# Patient Record
Sex: Female | Born: 1955
Health system: Southern US, Community
[De-identification: ages and names within clinical notes are randomized; demographics above are authoritative.]

## PROBLEM LIST (undated history)

## (undated) DIAGNOSIS — R112 Nausea with vomiting, unspecified: Secondary | ICD-10-CM

## (undated) DIAGNOSIS — Z9889 Other specified postprocedural states: Secondary | ICD-10-CM

## (undated) DIAGNOSIS — Z8489 Family history of other specified conditions: Secondary | ICD-10-CM

## (undated) DIAGNOSIS — I209 Angina pectoris, unspecified: Secondary | ICD-10-CM

## (undated) DIAGNOSIS — D649 Anemia, unspecified: Secondary | ICD-10-CM

## (undated) DIAGNOSIS — N183 Chronic kidney disease, stage 3 unspecified: Secondary | ICD-10-CM

## (undated) DIAGNOSIS — M1990 Unspecified osteoarthritis, unspecified site: Secondary | ICD-10-CM

## (undated) DIAGNOSIS — I10 Essential (primary) hypertension: Secondary | ICD-10-CM

## (undated) DIAGNOSIS — T8859XA Other complications of anesthesia, initial encounter: Secondary | ICD-10-CM

## (undated) DIAGNOSIS — T4145XA Adverse effect of unspecified anesthetic, initial encounter: Secondary | ICD-10-CM

## (undated) DIAGNOSIS — I441 Atrioventricular block, second degree: Secondary | ICD-10-CM

## (undated) DIAGNOSIS — E039 Hypothyroidism, unspecified: Secondary | ICD-10-CM

## (undated) DIAGNOSIS — E119 Type 2 diabetes mellitus without complications: Secondary | ICD-10-CM

## (undated) DIAGNOSIS — Z950 Presence of cardiac pacemaker: Secondary | ICD-10-CM

## (undated) HISTORY — PX: CARDIAC CATHETERIZATION: SHX172

---
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
## 1999-01-02 HISTORY — PX: TOTAL KNEE ARTHROPLASTY: SHX125

## 2010-01-01 HISTORY — PX: SHOULDER SURGERY: SHX246

## 2010-10-30 ENCOUNTER — Inpatient Hospital Stay (HOSPITAL_COMMUNITY): Payer: Medicare Other

## 2010-10-30 ENCOUNTER — Inpatient Hospital Stay (HOSPITAL_COMMUNITY)
Admission: RE | Admit: 2010-10-30 | Discharge: 2010-11-03 | DRG: 493 | Disposition: A | Discharge status: Skilled Nursing Facility | Payer: Medicare Other | Source: Ambulatory Visit | Attending: Orthopedic Surgery | Admitting: Orthopedic Surgery

## 2010-10-30 DIAGNOSIS — W19XXXA Unspecified fall, initial encounter: Secondary | ICD-10-CM | POA: Diagnosis present

## 2010-10-30 DIAGNOSIS — E119 Type 2 diabetes mellitus without complications: Secondary | ICD-10-CM | POA: Diagnosis present

## 2010-10-30 DIAGNOSIS — Z8679 Personal history of other diseases of the circulatory system: Secondary | ICD-10-CM

## 2010-10-30 DIAGNOSIS — E876 Hypokalemia: Secondary | ICD-10-CM | POA: Diagnosis not present

## 2010-10-30 DIAGNOSIS — Z6841 Body Mass Index (BMI) 40.0 and over, adult: Secondary | ICD-10-CM

## 2010-10-30 DIAGNOSIS — Z794 Long term (current) use of insulin: Secondary | ICD-10-CM

## 2010-10-30 DIAGNOSIS — Z96659 Presence of unspecified artificial knee joint: Secondary | ICD-10-CM

## 2010-10-30 DIAGNOSIS — D62 Acute posthemorrhagic anemia: Secondary | ICD-10-CM | POA: Diagnosis not present

## 2010-10-30 DIAGNOSIS — S42209A Unspecified fracture of upper end of unspecified humerus, initial encounter for closed fracture: Principal | ICD-10-CM | POA: Diagnosis present

## 2010-10-30 DIAGNOSIS — E039 Hypothyroidism, unspecified: Secondary | ICD-10-CM | POA: Diagnosis present

## 2010-10-30 DIAGNOSIS — G4733 Obstructive sleep apnea (adult) (pediatric): Secondary | ICD-10-CM | POA: Diagnosis present

## 2010-10-30 DIAGNOSIS — I2510 Atherosclerotic heart disease of native coronary artery without angina pectoris: Secondary | ICD-10-CM | POA: Diagnosis present

## 2010-10-30 DIAGNOSIS — Z79899 Other long term (current) drug therapy: Secondary | ICD-10-CM

## 2010-10-30 DIAGNOSIS — I10 Essential (primary) hypertension: Secondary | ICD-10-CM | POA: Diagnosis present

## 2010-10-30 LAB — URINALYSIS, MICROSCOPIC ONLY
Bilirubin Urine: NEGATIVE
Glucose, UA: NEGATIVE mg/dL
Ketones, ur: NEGATIVE mg/dL
Nitrite: NEGATIVE
Protein, ur: NEGATIVE mg/dL

## 2010-10-30 LAB — DIFFERENTIAL
Basophils Relative: 1 % (ref 0–1)
Lymphocytes Relative: 23 % (ref 12–46)
Lymphs Abs: 2 10*3/uL (ref 0.7–4.0)
Monocytes Absolute: 0.6 10*3/uL (ref 0.1–1.0)
Monocytes Relative: 7 % (ref 3–12)
Neutro Abs: 5.7 10*3/uL (ref 1.7–7.7)

## 2010-10-30 LAB — COMPREHENSIVE METABOLIC PANEL
ALT: 17 U/L (ref 0–35)
Alkaline Phosphatase: 112 U/L (ref 39–117)
BUN: 15 mg/dL (ref 6–23)
Chloride: 96 mEq/L (ref 96–112)
GFR calc Af Amer: 84 mL/min — ABNORMAL LOW (ref >90)
Glucose, Bld: 112 mg/dL — ABNORMAL HIGH (ref 70–99)
Potassium: 4.5 mEq/L (ref 3.5–5.1)
Total Bilirubin: 0.3 mg/dL (ref 0.3–1.2)

## 2010-10-30 LAB — CBC
HCT: 32.7 % — ABNORMAL LOW (ref 36.0–46.0)
Hemoglobin: 10.7 g/dL — ABNORMAL LOW (ref 12.0–15.0)
MCH: 26.6 pg (ref 26.0–34.0)
MCHC: 32.7 g/dL (ref 30.0–36.0)
MCV: 81.3 fL (ref 78.0–100.0)

## 2010-10-30 LAB — MRSA PCR SCREENING: MRSA by PCR: NEGATIVE

## 2010-10-30 LAB — TYPE AND SCREEN: ABO/RH(D): A NEG

## 2010-10-30 LAB — ABO/RH: ABO/RH(D): A NEG

## 2010-10-31 ENCOUNTER — Inpatient Hospital Stay (HOSPITAL_COMMUNITY): Payer: Medicare Other

## 2010-10-31 LAB — URINE CULTURE
Colony Count: NO GROWTH
Culture  Setup Time: 201210300108

## 2010-10-31 LAB — GLUCOSE, CAPILLARY: Glucose-Capillary: 101 mg/dL — ABNORMAL HIGH (ref 70–99)

## 2010-11-01 LAB — BASIC METABOLIC PANEL
BUN: 10 mg/dL (ref 6–23)
CO2: 25 mEq/L (ref 19–32)
Calcium: 9 mg/dL (ref 8.4–10.5)
Chloride: 96 mEq/L (ref 96–112)
Creatinine, Ser: 0.64 mg/dL (ref 0.50–1.10)
GFR calc Af Amer: >90 mL/min (ref >90)
GFR calc non Af Amer: >90 mL/min (ref >90)
Glucose, Bld: 128 mg/dL — ABNORMAL HIGH (ref 70–99)
Glucose, Bld: 161 mg/dL — ABNORMAL HIGH (ref 70–99)
Potassium: 5.4 mEq/L — ABNORMAL HIGH (ref 3.5–5.1)

## 2010-11-01 LAB — GLUCOSE, CAPILLARY
Glucose-Capillary: 134 mg/dL — ABNORMAL HIGH (ref 70–99)
Glucose-Capillary: 177 mg/dL — ABNORMAL HIGH (ref 70–99)
Glucose-Capillary: 151 mg/dL — ABNORMAL HIGH (ref 70–99)

## 2010-11-01 LAB — CBC
HCT: 32.3 % — ABNORMAL LOW (ref 36.0–46.0)
Hemoglobin: 10.3 g/dL — ABNORMAL LOW (ref 12.0–15.0)
MCH: 26.4 pg (ref 26.0–34.0)
MCHC: 31.9 g/dL (ref 30.0–36.0)
MCV: 82.8 fL (ref 78.0–100.0)
RDW: 16.5 % — ABNORMAL HIGH (ref 11.5–15.5)

## 2010-11-02 LAB — GLUCOSE, CAPILLARY
Glucose-Capillary: 136 mg/dL — ABNORMAL HIGH (ref 70–99)
Glucose-Capillary: 196 mg/dL — ABNORMAL HIGH (ref 70–99)
Glucose-Capillary: 183 mg/dL — ABNORMAL HIGH (ref 70–99)
Glucose-Capillary: 136 mg/dL — ABNORMAL HIGH (ref 70–99)
Glucose-Capillary: 164 mg/dL — ABNORMAL HIGH (ref 70–99)

## 2010-11-02 LAB — CBC
Hemoglobin: 8.7 g/dL — ABNORMAL LOW (ref 12.0–15.0)
Platelets: 276 10*3/uL (ref 150–400)
RBC: 3.34 MIL/uL — ABNORMAL LOW (ref 3.87–5.11)
WBC: 9.9 10*3/uL (ref 4.0–10.5)

## 2010-11-02 LAB — BASIC METABOLIC PANEL
CO2: 24 mEq/L (ref 19–32)
Glucose, Bld: 125 mg/dL — ABNORMAL HIGH (ref 70–99)
Potassium: 3.5 mEq/L (ref 3.5–5.1)
Sodium: 134 mEq/L — ABNORMAL LOW (ref 135–145)

## 2010-11-03 LAB — BASIC METABOLIC PANEL
BUN: 8 mg/dL (ref 6–23)
CO2: 24 mEq/L (ref 19–32)
Calcium: 8.7 mg/dL (ref 8.4–10.5)
Chloride: 98 mEq/L (ref 96–112)
Creatinine, Ser: 0.55 mg/dL (ref 0.50–1.10)

## 2010-11-03 LAB — TSH: TSH: 10.571 u[IU]/mL — ABNORMAL HIGH (ref 0.350–4.500)

## 2010-11-03 LAB — PHOSPHORUS: Phosphorus: 3.4 mg/dL (ref 2.3–4.6)

## 2010-11-03 LAB — CBC
HCT: 28.5 % — ABNORMAL LOW (ref 36.0–46.0)
MCH: 26.1 pg (ref 26.0–34.0)
MCV: 81.9 fL (ref 78.0–100.0)
Platelets: 277 10*3/uL (ref 150–400)
RBC: 3.48 MIL/uL — ABNORMAL LOW (ref 3.87–5.11)
RDW: 16.1 % — ABNORMAL HIGH (ref 11.5–15.5)
WBC: 8.1 10*3/uL (ref 4.0–10.5)

## 2010-11-03 LAB — PREALBUMIN: Prealbumin: 7.6 mg/dL — ABNORMAL LOW (ref 17.0–34.0)

## 2010-11-03 LAB — MAGNESIUM: Magnesium: 1.9 mg/dL (ref 1.5–2.5)

## 2010-11-03 LAB — GLUCOSE, CAPILLARY: Glucose-Capillary: 199 mg/dL — ABNORMAL HIGH (ref 70–99)

## 2010-11-06 LAB — VITAMIN B12 BINDING CAPACITY, BLOOD: Vitamin B12 Bind Capacity: 1543 pg/mL — ABNORMAL HIGH (ref 650–1340)

## 2010-11-07 NOTE — Op Note (Signed)
Vickie Ferguson, Ferguson NO.:  [id number]  MEDICAL RECORD NO.:  [id number]  LOCATION:  2016                         FACILITY:  [place]  PHYSICIAN:  Astrid Divine. Marcelino Scot, M.D. DATE OF BIRTH:  [birth year]  DATE OF PROCEDURE:  10/31/2010 DATE OF DISCHARGE:                              OPERATIVE REPORT   PREOPERATIVE DIAGNOSIS:  Right three-part proximal humerus fracture.  POSTOPERATIVE DIAGNOSIS:  Right three-part proximal humerus fracture.  PROCEDURE:  Open reduction and internal fixation of right proximal humerus.  SURGEON:  Astrid Divine. Marcelino Scot, MD  ASSISTANT:  Jari Pigg, PA  ANESTHESIA:  General.  COMPLICATIONS:  None.  ESTIMATED BLOOD LOSS:  100 mL.  DISPOSITION:  PACU.  CONDITION:  Stable.  BRIEF SUMMARY AND INDICATION FOR PROCEDURE:  Vickie Ferguson is a 55 year old, right-hand dominant, morbidly obese female who sustained a right proximal humerus fracture several weeks ago.  She was initially seen evaluated at the [facility] where because of her cardiac risk factors and the complexity of the fracture, they felt that she should be transferred for definitive treatment to [facility].  She was seen initially as an outpatient, admitted yesterday with consultation by Anesthesiology, and prior outpatient workup by Cardiology given V-tach runs during a prior cataract procedure.  I did discuss with her preoperative risks and benefits of the surgical repair of right proximal humerus including the significantly increased difficulty and anticipated increase in operative time because of her obesity which exceeds 400 pounds.  I also discussed the extreme importance of compliance postoperatively.  She is walker dependent and that noncompliance would result in a disastrous outcome with loss of fixation, nonunion, possible infection, and others.  We also discussed other systemic risks including arrhythmia, heart attack, stroke, death, nerve injury, vessel  injury, and after all these discussions, the patient did wish to proceed.  BRIEF DESCRIPTION OF PROCEDURE:  Vickie Ferguson was administered preop antibiotics and taken to the operating room where general anesthesia was induced.  Her right upper extremity was prepped and draped in the usual sterile fashion.  A standard deltopectoral approach was made.  The exposure and dissection was extended and more difficult secondary to the large soft tissue envelope.  I was able to identify the cephalic vein, retracted laterally with the deltoid, exposed the clavipectoral fascia which was incised, placed retractor along the short head of the biceps tendon, and then identified the long head biceps tendon.  A #2 FiberWire was placed into the posterior and superior rotator cuff, and an additional #2  FiberWire into the subscap and anterior cuff.  The humeral head was then controlled in this manner.  Because of the delay from the time of injury to surgery, it was difficult to mobilize these fracture segments, particularly in shaft and derotate them into a reduced position.  Ultimately, we were able to achieve this and secure a provisional fixation with K-wires checking multiple C-arm images and then converting it to a humeral plate.  Following placement of the plate and standard screws, locked screws were used and then the subscap and the lesser tuberosity fragment tied with #2 FiberWire and the greater tuberosity and supra and infraspinatus  components tied with #2 FiberWire to the plate as well.  The shell fragment of the posterior infraspinatus insertion was deep to the infraspinatus repair.  The proximal humerus shaft then moved as a unit.  I did also use a tenaculum with points to assist with reduction of the shaft to the humeral head.  My assistant, Ainsley Spinner, PA-C helped throughout the procedure and was absolutely necessary for its completion.  He pulled traction and derotated for reduction, and also  assisted me with placement and removal of provisional definitive fixation as well as repair of the tuberosities themselves.  He also assisted me with wound closure.  After final x- rays, wound was irrigated thoroughly and a standard layered closure performed with 0-Vicryl, 2-0 Vicryl, and staples for the skin.  Sterile gently compressive dressing was applied.  The patient was awakened from anesthesia and transferred to PACU in stable condition.  PROGNOSIS:  Vickie Ferguson has had a very difficult fracture repair and postoperatively is at risk for early weightbearing and infection because of her morbid obesity and other comorbid conditions.  She can begin passive range of motion, and this will be converted to active assisted motion at 6-8 weeks with active motion, most likely at the 8-10 week mark.  She will remain under surveillance on the monitored floor.     Astrid Divine. Marcelino Scot, M.D.     MHH/MEDQ  D:  10/31/2010  T:  10/31/2010  Job:  [id number]  Electronically Signed by Altamese Los Panes M.D. on 11/07/2010 01:50:27 PM

## 2010-11-08 LAB — PTH-RELATED PEPTIDE: PTH-related peptide: 13 pg/mL — ABNORMAL LOW (ref 14–27)

## 2010-12-30 DIAGNOSIS — S42309D Unspecified fracture of shaft of humerus, unspecified arm, subsequent encounter for fracture with routine healing: Secondary | ICD-10-CM | POA: Diagnosis not present

## 2010-12-30 DIAGNOSIS — Z9181 History of falling: Secondary | ICD-10-CM | POA: Diagnosis not present

## 2010-12-30 DIAGNOSIS — F329 Major depressive disorder, single episode, unspecified: Secondary | ICD-10-CM | POA: Diagnosis not present

## 2010-12-30 DIAGNOSIS — Z87440 Personal history of urinary (tract) infections: Secondary | ICD-10-CM | POA: Diagnosis not present

## 2010-12-30 DIAGNOSIS — E119 Type 2 diabetes mellitus without complications: Secondary | ICD-10-CM | POA: Diagnosis not present

## 2010-12-30 DIAGNOSIS — I10 Essential (primary) hypertension: Secondary | ICD-10-CM | POA: Diagnosis not present

## 2010-12-31 DIAGNOSIS — S42309D Unspecified fracture of shaft of humerus, unspecified arm, subsequent encounter for fracture with routine healing: Secondary | ICD-10-CM | POA: Diagnosis not present

## 2010-12-31 DIAGNOSIS — Z87440 Personal history of urinary (tract) infections: Secondary | ICD-10-CM | POA: Diagnosis not present

## 2010-12-31 DIAGNOSIS — I10 Essential (primary) hypertension: Secondary | ICD-10-CM | POA: Diagnosis not present

## 2010-12-31 DIAGNOSIS — Z9181 History of falling: Secondary | ICD-10-CM | POA: Diagnosis not present

## 2010-12-31 DIAGNOSIS — E119 Type 2 diabetes mellitus without complications: Secondary | ICD-10-CM | POA: Diagnosis not present

## 2010-12-31 DIAGNOSIS — F329 Major depressive disorder, single episode, unspecified: Secondary | ICD-10-CM | POA: Diagnosis not present

## 2011-01-01 DIAGNOSIS — Z87440 Personal history of urinary (tract) infections: Secondary | ICD-10-CM | POA: Diagnosis not present

## 2011-01-01 DIAGNOSIS — E119 Type 2 diabetes mellitus without complications: Secondary | ICD-10-CM | POA: Diagnosis not present

## 2011-01-01 DIAGNOSIS — S42309D Unspecified fracture of shaft of humerus, unspecified arm, subsequent encounter for fracture with routine healing: Secondary | ICD-10-CM | POA: Diagnosis not present

## 2011-01-01 DIAGNOSIS — F329 Major depressive disorder, single episode, unspecified: Secondary | ICD-10-CM | POA: Diagnosis not present

## 2011-01-01 DIAGNOSIS — Z9181 History of falling: Secondary | ICD-10-CM | POA: Diagnosis not present

## 2011-01-01 DIAGNOSIS — I10 Essential (primary) hypertension: Secondary | ICD-10-CM | POA: Diagnosis not present

## 2011-01-03 DIAGNOSIS — S42309D Unspecified fracture of shaft of humerus, unspecified arm, subsequent encounter for fracture with routine healing: Secondary | ICD-10-CM | POA: Diagnosis not present

## 2011-01-03 DIAGNOSIS — Z9181 History of falling: Secondary | ICD-10-CM | POA: Diagnosis not present

## 2011-01-03 DIAGNOSIS — I10 Essential (primary) hypertension: Secondary | ICD-10-CM | POA: Diagnosis not present

## 2011-01-03 DIAGNOSIS — E119 Type 2 diabetes mellitus without complications: Secondary | ICD-10-CM | POA: Diagnosis not present

## 2011-01-03 DIAGNOSIS — F329 Major depressive disorder, single episode, unspecified: Secondary | ICD-10-CM | POA: Diagnosis not present

## 2011-01-03 DIAGNOSIS — Z87440 Personal history of urinary (tract) infections: Secondary | ICD-10-CM | POA: Diagnosis not present

## 2011-01-04 DIAGNOSIS — Z9181 History of falling: Secondary | ICD-10-CM | POA: Diagnosis not present

## 2011-01-04 DIAGNOSIS — I10 Essential (primary) hypertension: Secondary | ICD-10-CM | POA: Diagnosis not present

## 2011-01-04 DIAGNOSIS — Z87440 Personal history of urinary (tract) infections: Secondary | ICD-10-CM | POA: Diagnosis not present

## 2011-01-04 DIAGNOSIS — S42309D Unspecified fracture of shaft of humerus, unspecified arm, subsequent encounter for fracture with routine healing: Secondary | ICD-10-CM | POA: Diagnosis not present

## 2011-01-04 DIAGNOSIS — F329 Major depressive disorder, single episode, unspecified: Secondary | ICD-10-CM | POA: Diagnosis not present

## 2011-01-04 DIAGNOSIS — E119 Type 2 diabetes mellitus without complications: Secondary | ICD-10-CM | POA: Diagnosis not present

## 2011-01-05 DIAGNOSIS — F329 Major depressive disorder, single episode, unspecified: Secondary | ICD-10-CM | POA: Diagnosis not present

## 2011-01-05 DIAGNOSIS — Z9181 History of falling: Secondary | ICD-10-CM | POA: Diagnosis not present

## 2011-01-05 DIAGNOSIS — I10 Essential (primary) hypertension: Secondary | ICD-10-CM | POA: Diagnosis not present

## 2011-01-05 DIAGNOSIS — E119 Type 2 diabetes mellitus without complications: Secondary | ICD-10-CM | POA: Diagnosis not present

## 2011-01-05 DIAGNOSIS — Z87440 Personal history of urinary (tract) infections: Secondary | ICD-10-CM | POA: Diagnosis not present

## 2011-01-05 DIAGNOSIS — S42309D Unspecified fracture of shaft of humerus, unspecified arm, subsequent encounter for fracture with routine healing: Secondary | ICD-10-CM | POA: Diagnosis not present

## 2011-01-08 DIAGNOSIS — Z6841 Body Mass Index (BMI) 40.0 and over, adult: Secondary | ICD-10-CM | POA: Diagnosis not present

## 2011-01-08 DIAGNOSIS — E119 Type 2 diabetes mellitus without complications: Secondary | ICD-10-CM | POA: Diagnosis not present

## 2011-01-08 DIAGNOSIS — I10 Essential (primary) hypertension: Secondary | ICD-10-CM | POA: Diagnosis not present

## 2011-01-08 DIAGNOSIS — M25519 Pain in unspecified shoulder: Secondary | ICD-10-CM | POA: Diagnosis not present

## 2011-01-09 DIAGNOSIS — F329 Major depressive disorder, single episode, unspecified: Secondary | ICD-10-CM | POA: Diagnosis not present

## 2011-01-09 DIAGNOSIS — S42309D Unspecified fracture of shaft of humerus, unspecified arm, subsequent encounter for fracture with routine healing: Secondary | ICD-10-CM | POA: Diagnosis not present

## 2011-01-09 DIAGNOSIS — I10 Essential (primary) hypertension: Secondary | ICD-10-CM | POA: Diagnosis not present

## 2011-01-09 DIAGNOSIS — E119 Type 2 diabetes mellitus without complications: Secondary | ICD-10-CM | POA: Diagnosis not present

## 2011-01-09 DIAGNOSIS — Z9181 History of falling: Secondary | ICD-10-CM | POA: Diagnosis not present

## 2011-01-09 DIAGNOSIS — Z87440 Personal history of urinary (tract) infections: Secondary | ICD-10-CM | POA: Diagnosis not present

## 2011-01-10 DIAGNOSIS — S42309D Unspecified fracture of shaft of humerus, unspecified arm, subsequent encounter for fracture with routine healing: Secondary | ICD-10-CM | POA: Diagnosis not present

## 2011-01-10 DIAGNOSIS — I10 Essential (primary) hypertension: Secondary | ICD-10-CM | POA: Diagnosis not present

## 2011-01-10 DIAGNOSIS — Z9181 History of falling: Secondary | ICD-10-CM | POA: Diagnosis not present

## 2011-01-10 DIAGNOSIS — E119 Type 2 diabetes mellitus without complications: Secondary | ICD-10-CM | POA: Diagnosis not present

## 2011-01-10 DIAGNOSIS — Z87440 Personal history of urinary (tract) infections: Secondary | ICD-10-CM | POA: Diagnosis not present

## 2011-01-10 DIAGNOSIS — F329 Major depressive disorder, single episode, unspecified: Secondary | ICD-10-CM | POA: Diagnosis not present

## 2011-01-11 DIAGNOSIS — Z87440 Personal history of urinary (tract) infections: Secondary | ICD-10-CM | POA: Diagnosis not present

## 2011-01-11 DIAGNOSIS — I10 Essential (primary) hypertension: Secondary | ICD-10-CM | POA: Diagnosis not present

## 2011-01-11 DIAGNOSIS — Z9181 History of falling: Secondary | ICD-10-CM | POA: Diagnosis not present

## 2011-01-11 DIAGNOSIS — S42309D Unspecified fracture of shaft of humerus, unspecified arm, subsequent encounter for fracture with routine healing: Secondary | ICD-10-CM | POA: Diagnosis not present

## 2011-01-11 DIAGNOSIS — F329 Major depressive disorder, single episode, unspecified: Secondary | ICD-10-CM | POA: Diagnosis not present

## 2011-01-11 DIAGNOSIS — E119 Type 2 diabetes mellitus without complications: Secondary | ICD-10-CM | POA: Diagnosis not present

## 2011-01-12 DIAGNOSIS — I10 Essential (primary) hypertension: Secondary | ICD-10-CM | POA: Diagnosis not present

## 2011-01-12 DIAGNOSIS — Z87440 Personal history of urinary (tract) infections: Secondary | ICD-10-CM | POA: Diagnosis not present

## 2011-01-12 DIAGNOSIS — E119 Type 2 diabetes mellitus without complications: Secondary | ICD-10-CM | POA: Diagnosis not present

## 2011-01-12 DIAGNOSIS — Z9181 History of falling: Secondary | ICD-10-CM | POA: Diagnosis not present

## 2011-01-12 DIAGNOSIS — S42309D Unspecified fracture of shaft of humerus, unspecified arm, subsequent encounter for fracture with routine healing: Secondary | ICD-10-CM | POA: Diagnosis not present

## 2011-01-12 DIAGNOSIS — F329 Major depressive disorder, single episode, unspecified: Secondary | ICD-10-CM | POA: Diagnosis not present

## 2011-01-13 DIAGNOSIS — Z9181 History of falling: Secondary | ICD-10-CM | POA: Diagnosis not present

## 2011-01-13 DIAGNOSIS — I10 Essential (primary) hypertension: Secondary | ICD-10-CM | POA: Diagnosis not present

## 2011-01-13 DIAGNOSIS — F329 Major depressive disorder, single episode, unspecified: Secondary | ICD-10-CM | POA: Diagnosis not present

## 2011-01-13 DIAGNOSIS — E119 Type 2 diabetes mellitus without complications: Secondary | ICD-10-CM | POA: Diagnosis not present

## 2011-01-13 DIAGNOSIS — S42309D Unspecified fracture of shaft of humerus, unspecified arm, subsequent encounter for fracture with routine healing: Secondary | ICD-10-CM | POA: Diagnosis not present

## 2011-01-13 DIAGNOSIS — Z87440 Personal history of urinary (tract) infections: Secondary | ICD-10-CM | POA: Diagnosis not present

## 2011-01-16 DIAGNOSIS — Z87440 Personal history of urinary (tract) infections: Secondary | ICD-10-CM | POA: Diagnosis not present

## 2011-01-16 DIAGNOSIS — S42309D Unspecified fracture of shaft of humerus, unspecified arm, subsequent encounter for fracture with routine healing: Secondary | ICD-10-CM | POA: Diagnosis not present

## 2011-01-16 DIAGNOSIS — E119 Type 2 diabetes mellitus without complications: Secondary | ICD-10-CM | POA: Diagnosis not present

## 2011-01-16 DIAGNOSIS — I10 Essential (primary) hypertension: Secondary | ICD-10-CM | POA: Diagnosis not present

## 2011-01-16 DIAGNOSIS — Z9181 History of falling: Secondary | ICD-10-CM | POA: Diagnosis not present

## 2011-01-16 DIAGNOSIS — F329 Major depressive disorder, single episode, unspecified: Secondary | ICD-10-CM | POA: Diagnosis not present

## 2011-01-17 DIAGNOSIS — S42213A Unspecified displaced fracture of surgical neck of unspecified humerus, initial encounter for closed fracture: Secondary | ICD-10-CM | POA: Diagnosis not present

## 2011-01-18 DIAGNOSIS — Z9181 History of falling: Secondary | ICD-10-CM | POA: Diagnosis not present

## 2011-01-18 DIAGNOSIS — Z87440 Personal history of urinary (tract) infections: Secondary | ICD-10-CM | POA: Diagnosis not present

## 2011-01-18 DIAGNOSIS — E119 Type 2 diabetes mellitus without complications: Secondary | ICD-10-CM | POA: Diagnosis not present

## 2011-01-18 DIAGNOSIS — N390 Urinary tract infection, site not specified: Secondary | ICD-10-CM | POA: Diagnosis not present

## 2011-01-18 DIAGNOSIS — I10 Essential (primary) hypertension: Secondary | ICD-10-CM | POA: Diagnosis not present

## 2011-01-18 DIAGNOSIS — S42309D Unspecified fracture of shaft of humerus, unspecified arm, subsequent encounter for fracture with routine healing: Secondary | ICD-10-CM | POA: Diagnosis not present

## 2011-01-18 DIAGNOSIS — F329 Major depressive disorder, single episode, unspecified: Secondary | ICD-10-CM | POA: Diagnosis not present

## 2011-01-19 DIAGNOSIS — S42309D Unspecified fracture of shaft of humerus, unspecified arm, subsequent encounter for fracture with routine healing: Secondary | ICD-10-CM | POA: Diagnosis not present

## 2011-01-19 DIAGNOSIS — E119 Type 2 diabetes mellitus without complications: Secondary | ICD-10-CM | POA: Diagnosis not present

## 2011-01-19 DIAGNOSIS — F329 Major depressive disorder, single episode, unspecified: Secondary | ICD-10-CM | POA: Diagnosis not present

## 2011-01-19 DIAGNOSIS — I10 Essential (primary) hypertension: Secondary | ICD-10-CM | POA: Diagnosis not present

## 2011-01-19 DIAGNOSIS — Z87440 Personal history of urinary (tract) infections: Secondary | ICD-10-CM | POA: Diagnosis not present

## 2011-01-19 DIAGNOSIS — Z9181 History of falling: Secondary | ICD-10-CM | POA: Diagnosis not present

## 2011-01-22 DIAGNOSIS — E119 Type 2 diabetes mellitus without complications: Secondary | ICD-10-CM | POA: Diagnosis not present

## 2011-01-22 DIAGNOSIS — Z87440 Personal history of urinary (tract) infections: Secondary | ICD-10-CM | POA: Diagnosis not present

## 2011-01-22 DIAGNOSIS — Z9181 History of falling: Secondary | ICD-10-CM | POA: Diagnosis not present

## 2011-01-22 DIAGNOSIS — S42309D Unspecified fracture of shaft of humerus, unspecified arm, subsequent encounter for fracture with routine healing: Secondary | ICD-10-CM | POA: Diagnosis not present

## 2011-01-22 DIAGNOSIS — F329 Major depressive disorder, single episode, unspecified: Secondary | ICD-10-CM | POA: Diagnosis not present

## 2011-01-22 DIAGNOSIS — I472 Ventricular tachycardia: Secondary | ICD-10-CM | POA: Diagnosis not present

## 2011-01-22 DIAGNOSIS — R079 Chest pain, unspecified: Secondary | ICD-10-CM | POA: Diagnosis not present

## 2011-01-22 DIAGNOSIS — I10 Essential (primary) hypertension: Secondary | ICD-10-CM | POA: Diagnosis not present

## 2011-01-23 DIAGNOSIS — I10 Essential (primary) hypertension: Secondary | ICD-10-CM | POA: Diagnosis not present

## 2011-01-23 DIAGNOSIS — Z87440 Personal history of urinary (tract) infections: Secondary | ICD-10-CM | POA: Diagnosis not present

## 2011-01-23 DIAGNOSIS — Z9181 History of falling: Secondary | ICD-10-CM | POA: Diagnosis not present

## 2011-01-23 DIAGNOSIS — S42309D Unspecified fracture of shaft of humerus, unspecified arm, subsequent encounter for fracture with routine healing: Secondary | ICD-10-CM | POA: Diagnosis not present

## 2011-01-23 DIAGNOSIS — F329 Major depressive disorder, single episode, unspecified: Secondary | ICD-10-CM | POA: Diagnosis not present

## 2011-01-23 DIAGNOSIS — E119 Type 2 diabetes mellitus without complications: Secondary | ICD-10-CM | POA: Diagnosis not present

## 2011-01-24 DIAGNOSIS — E119 Type 2 diabetes mellitus without complications: Secondary | ICD-10-CM | POA: Diagnosis not present

## 2011-01-24 DIAGNOSIS — Z87440 Personal history of urinary (tract) infections: Secondary | ICD-10-CM | POA: Diagnosis not present

## 2011-01-24 DIAGNOSIS — S42309D Unspecified fracture of shaft of humerus, unspecified arm, subsequent encounter for fracture with routine healing: Secondary | ICD-10-CM | POA: Diagnosis not present

## 2011-01-24 DIAGNOSIS — I10 Essential (primary) hypertension: Secondary | ICD-10-CM | POA: Diagnosis not present

## 2011-01-24 DIAGNOSIS — Z9181 History of falling: Secondary | ICD-10-CM | POA: Diagnosis not present

## 2011-01-24 DIAGNOSIS — F329 Major depressive disorder, single episode, unspecified: Secondary | ICD-10-CM | POA: Diagnosis not present

## 2011-01-25 DIAGNOSIS — I10 Essential (primary) hypertension: Secondary | ICD-10-CM | POA: Diagnosis not present

## 2011-01-25 DIAGNOSIS — Z87440 Personal history of urinary (tract) infections: Secondary | ICD-10-CM | POA: Diagnosis not present

## 2011-01-25 DIAGNOSIS — Z9181 History of falling: Secondary | ICD-10-CM | POA: Diagnosis not present

## 2011-01-25 DIAGNOSIS — F329 Major depressive disorder, single episode, unspecified: Secondary | ICD-10-CM | POA: Diagnosis not present

## 2011-01-25 DIAGNOSIS — E119 Type 2 diabetes mellitus without complications: Secondary | ICD-10-CM | POA: Diagnosis not present

## 2011-01-25 DIAGNOSIS — S42309D Unspecified fracture of shaft of humerus, unspecified arm, subsequent encounter for fracture with routine healing: Secondary | ICD-10-CM | POA: Diagnosis not present

## 2011-01-26 DIAGNOSIS — I10 Essential (primary) hypertension: Secondary | ICD-10-CM | POA: Diagnosis not present

## 2011-01-26 DIAGNOSIS — S42309D Unspecified fracture of shaft of humerus, unspecified arm, subsequent encounter for fracture with routine healing: Secondary | ICD-10-CM | POA: Diagnosis not present

## 2011-01-26 DIAGNOSIS — E119 Type 2 diabetes mellitus without complications: Secondary | ICD-10-CM | POA: Diagnosis not present

## 2011-01-26 DIAGNOSIS — F329 Major depressive disorder, single episode, unspecified: Secondary | ICD-10-CM | POA: Diagnosis not present

## 2011-01-26 DIAGNOSIS — Z9181 History of falling: Secondary | ICD-10-CM | POA: Diagnosis not present

## 2011-01-26 DIAGNOSIS — Z87440 Personal history of urinary (tract) infections: Secondary | ICD-10-CM | POA: Diagnosis not present

## 2011-01-29 DIAGNOSIS — Z9181 History of falling: Secondary | ICD-10-CM | POA: Diagnosis not present

## 2011-01-29 DIAGNOSIS — E119 Type 2 diabetes mellitus without complications: Secondary | ICD-10-CM | POA: Diagnosis not present

## 2011-01-29 DIAGNOSIS — Z87440 Personal history of urinary (tract) infections: Secondary | ICD-10-CM | POA: Diagnosis not present

## 2011-01-29 DIAGNOSIS — I10 Essential (primary) hypertension: Secondary | ICD-10-CM | POA: Diagnosis not present

## 2011-01-29 DIAGNOSIS — S42309D Unspecified fracture of shaft of humerus, unspecified arm, subsequent encounter for fracture with routine healing: Secondary | ICD-10-CM | POA: Diagnosis not present

## 2011-01-29 DIAGNOSIS — F329 Major depressive disorder, single episode, unspecified: Secondary | ICD-10-CM | POA: Diagnosis not present

## 2011-01-31 DIAGNOSIS — Z9181 History of falling: Secondary | ICD-10-CM | POA: Diagnosis not present

## 2011-01-31 DIAGNOSIS — I10 Essential (primary) hypertension: Secondary | ICD-10-CM | POA: Diagnosis not present

## 2011-01-31 DIAGNOSIS — F329 Major depressive disorder, single episode, unspecified: Secondary | ICD-10-CM | POA: Diagnosis not present

## 2011-01-31 DIAGNOSIS — Z87440 Personal history of urinary (tract) infections: Secondary | ICD-10-CM | POA: Diagnosis not present

## 2011-01-31 DIAGNOSIS — E119 Type 2 diabetes mellitus without complications: Secondary | ICD-10-CM | POA: Diagnosis not present

## 2011-01-31 DIAGNOSIS — S42309D Unspecified fracture of shaft of humerus, unspecified arm, subsequent encounter for fracture with routine healing: Secondary | ICD-10-CM | POA: Diagnosis not present

## 2011-02-01 DIAGNOSIS — D539 Nutritional anemia, unspecified: Secondary | ICD-10-CM | POA: Diagnosis not present

## 2011-02-01 DIAGNOSIS — I10 Essential (primary) hypertension: Secondary | ICD-10-CM | POA: Diagnosis not present

## 2011-02-01 DIAGNOSIS — E119 Type 2 diabetes mellitus without complications: Secondary | ICD-10-CM | POA: Diagnosis not present

## 2011-02-01 DIAGNOSIS — Z87440 Personal history of urinary (tract) infections: Secondary | ICD-10-CM | POA: Diagnosis not present

## 2011-02-01 DIAGNOSIS — Z9181 History of falling: Secondary | ICD-10-CM | POA: Diagnosis not present

## 2011-02-01 DIAGNOSIS — S42309D Unspecified fracture of shaft of humerus, unspecified arm, subsequent encounter for fracture with routine healing: Secondary | ICD-10-CM | POA: Diagnosis not present

## 2011-02-01 DIAGNOSIS — F329 Major depressive disorder, single episode, unspecified: Secondary | ICD-10-CM | POA: Diagnosis not present

## 2011-02-01 DIAGNOSIS — R000 Tachycardia, unspecified: Secondary | ICD-10-CM | POA: Diagnosis not present

## 2011-02-02 DIAGNOSIS — I10 Essential (primary) hypertension: Secondary | ICD-10-CM | POA: Diagnosis not present

## 2011-02-02 DIAGNOSIS — S42309D Unspecified fracture of shaft of humerus, unspecified arm, subsequent encounter for fracture with routine healing: Secondary | ICD-10-CM | POA: Diagnosis not present

## 2011-02-02 DIAGNOSIS — F329 Major depressive disorder, single episode, unspecified: Secondary | ICD-10-CM | POA: Diagnosis not present

## 2011-02-02 DIAGNOSIS — Z9181 History of falling: Secondary | ICD-10-CM | POA: Diagnosis not present

## 2011-02-02 DIAGNOSIS — Z87440 Personal history of urinary (tract) infections: Secondary | ICD-10-CM | POA: Diagnosis not present

## 2011-02-02 DIAGNOSIS — E119 Type 2 diabetes mellitus without complications: Secondary | ICD-10-CM | POA: Diagnosis not present

## 2011-02-05 DIAGNOSIS — Z87440 Personal history of urinary (tract) infections: Secondary | ICD-10-CM | POA: Diagnosis not present

## 2011-02-05 DIAGNOSIS — E119 Type 2 diabetes mellitus without complications: Secondary | ICD-10-CM | POA: Diagnosis not present

## 2011-02-05 DIAGNOSIS — S42309D Unspecified fracture of shaft of humerus, unspecified arm, subsequent encounter for fracture with routine healing: Secondary | ICD-10-CM | POA: Diagnosis not present

## 2011-02-05 DIAGNOSIS — I10 Essential (primary) hypertension: Secondary | ICD-10-CM | POA: Diagnosis not present

## 2011-02-05 DIAGNOSIS — F329 Major depressive disorder, single episode, unspecified: Secondary | ICD-10-CM | POA: Diagnosis not present

## 2011-02-05 DIAGNOSIS — Z9181 History of falling: Secondary | ICD-10-CM | POA: Diagnosis not present

## 2011-02-07 DIAGNOSIS — S42209A Unspecified fracture of upper end of unspecified humerus, initial encounter for closed fracture: Secondary | ICD-10-CM | POA: Diagnosis not present

## 2011-02-07 DIAGNOSIS — S42213A Unspecified displaced fracture of surgical neck of unspecified humerus, initial encounter for closed fracture: Secondary | ICD-10-CM | POA: Diagnosis not present

## 2011-02-07 DIAGNOSIS — S42309D Unspecified fracture of shaft of humerus, unspecified arm, subsequent encounter for fracture with routine healing: Secondary | ICD-10-CM | POA: Diagnosis not present

## 2011-02-07 DIAGNOSIS — Z9181 History of falling: Secondary | ICD-10-CM | POA: Diagnosis not present

## 2011-02-07 DIAGNOSIS — Z87440 Personal history of urinary (tract) infections: Secondary | ICD-10-CM | POA: Diagnosis not present

## 2011-02-07 DIAGNOSIS — E119 Type 2 diabetes mellitus without complications: Secondary | ICD-10-CM | POA: Diagnosis not present

## 2011-02-07 DIAGNOSIS — F329 Major depressive disorder, single episode, unspecified: Secondary | ICD-10-CM | POA: Diagnosis not present

## 2011-02-07 DIAGNOSIS — IMO0001 Reserved for inherently not codable concepts without codable children: Secondary | ICD-10-CM | POA: Diagnosis not present

## 2011-02-07 DIAGNOSIS — I10 Essential (primary) hypertension: Secondary | ICD-10-CM | POA: Diagnosis not present

## 2011-02-08 DIAGNOSIS — D539 Nutritional anemia, unspecified: Secondary | ICD-10-CM | POA: Diagnosis not present

## 2011-02-08 DIAGNOSIS — M79609 Pain in unspecified limb: Secondary | ICD-10-CM | POA: Diagnosis not present

## 2011-02-08 DIAGNOSIS — Z6841 Body Mass Index (BMI) 40.0 and over, adult: Secondary | ICD-10-CM | POA: Diagnosis not present

## 2011-02-09 DIAGNOSIS — Z87440 Personal history of urinary (tract) infections: Secondary | ICD-10-CM | POA: Diagnosis not present

## 2011-02-09 DIAGNOSIS — Z9181 History of falling: Secondary | ICD-10-CM | POA: Diagnosis not present

## 2011-02-09 DIAGNOSIS — F329 Major depressive disorder, single episode, unspecified: Secondary | ICD-10-CM | POA: Diagnosis not present

## 2011-02-09 DIAGNOSIS — S42309D Unspecified fracture of shaft of humerus, unspecified arm, subsequent encounter for fracture with routine healing: Secondary | ICD-10-CM | POA: Diagnosis not present

## 2011-02-09 DIAGNOSIS — E119 Type 2 diabetes mellitus without complications: Secondary | ICD-10-CM | POA: Diagnosis not present

## 2011-02-09 DIAGNOSIS — I10 Essential (primary) hypertension: Secondary | ICD-10-CM | POA: Diagnosis not present

## 2011-02-12 DIAGNOSIS — F329 Major depressive disorder, single episode, unspecified: Secondary | ICD-10-CM | POA: Diagnosis not present

## 2011-02-12 DIAGNOSIS — I10 Essential (primary) hypertension: Secondary | ICD-10-CM | POA: Diagnosis not present

## 2011-02-12 DIAGNOSIS — Z87440 Personal history of urinary (tract) infections: Secondary | ICD-10-CM | POA: Diagnosis not present

## 2011-02-12 DIAGNOSIS — Z9181 History of falling: Secondary | ICD-10-CM | POA: Diagnosis not present

## 2011-02-12 DIAGNOSIS — S42309D Unspecified fracture of shaft of humerus, unspecified arm, subsequent encounter for fracture with routine healing: Secondary | ICD-10-CM | POA: Diagnosis not present

## 2011-02-12 DIAGNOSIS — E119 Type 2 diabetes mellitus without complications: Secondary | ICD-10-CM | POA: Diagnosis not present

## 2011-02-13 DIAGNOSIS — E119 Type 2 diabetes mellitus without complications: Secondary | ICD-10-CM | POA: Diagnosis not present

## 2011-02-13 DIAGNOSIS — Z9181 History of falling: Secondary | ICD-10-CM | POA: Diagnosis not present

## 2011-02-13 DIAGNOSIS — F329 Major depressive disorder, single episode, unspecified: Secondary | ICD-10-CM | POA: Diagnosis not present

## 2011-02-13 DIAGNOSIS — S42309D Unspecified fracture of shaft of humerus, unspecified arm, subsequent encounter for fracture with routine healing: Secondary | ICD-10-CM | POA: Diagnosis not present

## 2011-02-13 DIAGNOSIS — Z87440 Personal history of urinary (tract) infections: Secondary | ICD-10-CM | POA: Diagnosis not present

## 2011-02-13 DIAGNOSIS — I10 Essential (primary) hypertension: Secondary | ICD-10-CM | POA: Diagnosis not present

## 2011-02-16 DIAGNOSIS — E119 Type 2 diabetes mellitus without complications: Secondary | ICD-10-CM | POA: Diagnosis not present

## 2011-02-16 DIAGNOSIS — I10 Essential (primary) hypertension: Secondary | ICD-10-CM | POA: Diagnosis not present

## 2011-02-16 DIAGNOSIS — N390 Urinary tract infection, site not specified: Secondary | ICD-10-CM | POA: Diagnosis not present

## 2011-02-16 DIAGNOSIS — Z9181 History of falling: Secondary | ICD-10-CM | POA: Diagnosis not present

## 2011-02-16 DIAGNOSIS — F329 Major depressive disorder, single episode, unspecified: Secondary | ICD-10-CM | POA: Diagnosis not present

## 2011-02-16 DIAGNOSIS — S42309D Unspecified fracture of shaft of humerus, unspecified arm, subsequent encounter for fracture with routine healing: Secondary | ICD-10-CM | POA: Diagnosis not present

## 2011-02-16 DIAGNOSIS — Z87440 Personal history of urinary (tract) infections: Secondary | ICD-10-CM | POA: Diagnosis not present

## 2011-02-19 DIAGNOSIS — Z9181 History of falling: Secondary | ICD-10-CM | POA: Diagnosis not present

## 2011-02-19 DIAGNOSIS — E119 Type 2 diabetes mellitus without complications: Secondary | ICD-10-CM | POA: Diagnosis not present

## 2011-02-19 DIAGNOSIS — F329 Major depressive disorder, single episode, unspecified: Secondary | ICD-10-CM | POA: Diagnosis not present

## 2011-02-19 DIAGNOSIS — Z87440 Personal history of urinary (tract) infections: Secondary | ICD-10-CM | POA: Diagnosis not present

## 2011-02-19 DIAGNOSIS — I10 Essential (primary) hypertension: Secondary | ICD-10-CM | POA: Diagnosis not present

## 2011-02-19 DIAGNOSIS — S42309D Unspecified fracture of shaft of humerus, unspecified arm, subsequent encounter for fracture with routine healing: Secondary | ICD-10-CM | POA: Diagnosis not present

## 2011-02-20 DIAGNOSIS — Z9181 History of falling: Secondary | ICD-10-CM | POA: Diagnosis not present

## 2011-02-20 DIAGNOSIS — S42309D Unspecified fracture of shaft of humerus, unspecified arm, subsequent encounter for fracture with routine healing: Secondary | ICD-10-CM | POA: Diagnosis not present

## 2011-02-20 DIAGNOSIS — F329 Major depressive disorder, single episode, unspecified: Secondary | ICD-10-CM | POA: Diagnosis not present

## 2011-02-20 DIAGNOSIS — E119 Type 2 diabetes mellitus without complications: Secondary | ICD-10-CM | POA: Diagnosis not present

## 2011-02-20 DIAGNOSIS — Z87440 Personal history of urinary (tract) infections: Secondary | ICD-10-CM | POA: Diagnosis not present

## 2011-02-20 DIAGNOSIS — I10 Essential (primary) hypertension: Secondary | ICD-10-CM | POA: Diagnosis not present

## 2011-02-21 DIAGNOSIS — Z9181 History of falling: Secondary | ICD-10-CM | POA: Diagnosis not present

## 2011-02-21 DIAGNOSIS — F329 Major depressive disorder, single episode, unspecified: Secondary | ICD-10-CM | POA: Diagnosis not present

## 2011-02-21 DIAGNOSIS — S42309D Unspecified fracture of shaft of humerus, unspecified arm, subsequent encounter for fracture with routine healing: Secondary | ICD-10-CM | POA: Diagnosis not present

## 2011-02-21 DIAGNOSIS — I10 Essential (primary) hypertension: Secondary | ICD-10-CM | POA: Diagnosis not present

## 2011-02-21 DIAGNOSIS — Z87440 Personal history of urinary (tract) infections: Secondary | ICD-10-CM | POA: Diagnosis not present

## 2011-02-21 DIAGNOSIS — E119 Type 2 diabetes mellitus without complications: Secondary | ICD-10-CM | POA: Diagnosis not present

## 2011-02-24 DIAGNOSIS — I10 Essential (primary) hypertension: Secondary | ICD-10-CM | POA: Diagnosis not present

## 2011-02-24 DIAGNOSIS — Z9181 History of falling: Secondary | ICD-10-CM | POA: Diagnosis not present

## 2011-02-24 DIAGNOSIS — F329 Major depressive disorder, single episode, unspecified: Secondary | ICD-10-CM | POA: Diagnosis not present

## 2011-02-24 DIAGNOSIS — Z87440 Personal history of urinary (tract) infections: Secondary | ICD-10-CM | POA: Diagnosis not present

## 2011-02-24 DIAGNOSIS — S42309D Unspecified fracture of shaft of humerus, unspecified arm, subsequent encounter for fracture with routine healing: Secondary | ICD-10-CM | POA: Diagnosis not present

## 2011-02-24 DIAGNOSIS — E119 Type 2 diabetes mellitus without complications: Secondary | ICD-10-CM | POA: Diagnosis not present

## 2011-02-27 DIAGNOSIS — F329 Major depressive disorder, single episode, unspecified: Secondary | ICD-10-CM | POA: Diagnosis not present

## 2011-02-27 DIAGNOSIS — Z9181 History of falling: Secondary | ICD-10-CM | POA: Diagnosis not present

## 2011-02-27 DIAGNOSIS — E119 Type 2 diabetes mellitus without complications: Secondary | ICD-10-CM | POA: Diagnosis not present

## 2011-02-27 DIAGNOSIS — Z87440 Personal history of urinary (tract) infections: Secondary | ICD-10-CM | POA: Diagnosis not present

## 2011-02-27 DIAGNOSIS — I10 Essential (primary) hypertension: Secondary | ICD-10-CM | POA: Diagnosis not present

## 2011-02-27 DIAGNOSIS — S42309D Unspecified fracture of shaft of humerus, unspecified arm, subsequent encounter for fracture with routine healing: Secondary | ICD-10-CM | POA: Diagnosis not present

## 2011-02-28 DIAGNOSIS — Z9181 History of falling: Secondary | ICD-10-CM | POA: Diagnosis not present

## 2011-02-28 DIAGNOSIS — F329 Major depressive disorder, single episode, unspecified: Secondary | ICD-10-CM | POA: Diagnosis not present

## 2011-02-28 DIAGNOSIS — S42309D Unspecified fracture of shaft of humerus, unspecified arm, subsequent encounter for fracture with routine healing: Secondary | ICD-10-CM | POA: Diagnosis not present

## 2011-02-28 DIAGNOSIS — E119 Type 2 diabetes mellitus without complications: Secondary | ICD-10-CM | POA: Diagnosis not present

## 2011-02-28 DIAGNOSIS — I10 Essential (primary) hypertension: Secondary | ICD-10-CM | POA: Diagnosis not present

## 2011-03-01 DIAGNOSIS — S42309D Unspecified fracture of shaft of humerus, unspecified arm, subsequent encounter for fracture with routine healing: Secondary | ICD-10-CM | POA: Diagnosis not present

## 2011-03-01 DIAGNOSIS — E119 Type 2 diabetes mellitus without complications: Secondary | ICD-10-CM | POA: Diagnosis not present

## 2011-03-01 DIAGNOSIS — F329 Major depressive disorder, single episode, unspecified: Secondary | ICD-10-CM | POA: Diagnosis not present

## 2011-03-01 DIAGNOSIS — Z9181 History of falling: Secondary | ICD-10-CM | POA: Diagnosis not present

## 2011-03-01 DIAGNOSIS — I10 Essential (primary) hypertension: Secondary | ICD-10-CM | POA: Diagnosis not present

## 2011-03-02 DIAGNOSIS — I10 Essential (primary) hypertension: Secondary | ICD-10-CM | POA: Diagnosis not present

## 2011-03-02 DIAGNOSIS — S42309D Unspecified fracture of shaft of humerus, unspecified arm, subsequent encounter for fracture with routine healing: Secondary | ICD-10-CM | POA: Diagnosis not present

## 2011-03-02 DIAGNOSIS — F329 Major depressive disorder, single episode, unspecified: Secondary | ICD-10-CM | POA: Diagnosis not present

## 2011-03-02 DIAGNOSIS — Z9181 History of falling: Secondary | ICD-10-CM | POA: Diagnosis not present

## 2011-03-02 DIAGNOSIS — E119 Type 2 diabetes mellitus without complications: Secondary | ICD-10-CM | POA: Diagnosis not present

## 2011-03-02 DIAGNOSIS — N390 Urinary tract infection, site not specified: Secondary | ICD-10-CM | POA: Diagnosis not present

## 2011-03-05 DIAGNOSIS — Z9181 History of falling: Secondary | ICD-10-CM | POA: Diagnosis not present

## 2011-03-05 DIAGNOSIS — S42309D Unspecified fracture of shaft of humerus, unspecified arm, subsequent encounter for fracture with routine healing: Secondary | ICD-10-CM | POA: Diagnosis not present

## 2011-03-05 DIAGNOSIS — I10 Essential (primary) hypertension: Secondary | ICD-10-CM | POA: Diagnosis not present

## 2011-03-05 DIAGNOSIS — E119 Type 2 diabetes mellitus without complications: Secondary | ICD-10-CM | POA: Diagnosis not present

## 2011-03-05 DIAGNOSIS — F329 Major depressive disorder, single episode, unspecified: Secondary | ICD-10-CM | POA: Diagnosis not present

## 2011-03-06 DIAGNOSIS — I10 Essential (primary) hypertension: Secondary | ICD-10-CM | POA: Diagnosis not present

## 2011-03-06 DIAGNOSIS — Z9181 History of falling: Secondary | ICD-10-CM | POA: Diagnosis not present

## 2011-03-06 DIAGNOSIS — F329 Major depressive disorder, single episode, unspecified: Secondary | ICD-10-CM | POA: Diagnosis not present

## 2011-03-06 DIAGNOSIS — E119 Type 2 diabetes mellitus without complications: Secondary | ICD-10-CM | POA: Diagnosis not present

## 2011-03-06 DIAGNOSIS — S42309D Unspecified fracture of shaft of humerus, unspecified arm, subsequent encounter for fracture with routine healing: Secondary | ICD-10-CM | POA: Diagnosis not present

## 2011-03-07 DIAGNOSIS — I10 Essential (primary) hypertension: Secondary | ICD-10-CM | POA: Diagnosis not present

## 2011-03-07 DIAGNOSIS — S42213A Unspecified displaced fracture of surgical neck of unspecified humerus, initial encounter for closed fracture: Secondary | ICD-10-CM | POA: Diagnosis not present

## 2011-03-07 DIAGNOSIS — IMO0001 Reserved for inherently not codable concepts without codable children: Secondary | ICD-10-CM | POA: Diagnosis not present

## 2011-03-07 DIAGNOSIS — F329 Major depressive disorder, single episode, unspecified: Secondary | ICD-10-CM | POA: Diagnosis not present

## 2011-03-07 DIAGNOSIS — S42309D Unspecified fracture of shaft of humerus, unspecified arm, subsequent encounter for fracture with routine healing: Secondary | ICD-10-CM | POA: Diagnosis not present

## 2011-03-07 DIAGNOSIS — E119 Type 2 diabetes mellitus without complications: Secondary | ICD-10-CM | POA: Diagnosis not present

## 2011-03-07 DIAGNOSIS — Z9181 History of falling: Secondary | ICD-10-CM | POA: Diagnosis not present

## 2011-03-07 DIAGNOSIS — S42209A Unspecified fracture of upper end of unspecified humerus, initial encounter for closed fracture: Secondary | ICD-10-CM | POA: Diagnosis not present

## 2011-03-09 DIAGNOSIS — F329 Major depressive disorder, single episode, unspecified: Secondary | ICD-10-CM | POA: Diagnosis not present

## 2011-03-09 DIAGNOSIS — S42309D Unspecified fracture of shaft of humerus, unspecified arm, subsequent encounter for fracture with routine healing: Secondary | ICD-10-CM | POA: Diagnosis not present

## 2011-03-09 DIAGNOSIS — Z9181 History of falling: Secondary | ICD-10-CM | POA: Diagnosis not present

## 2011-03-09 DIAGNOSIS — E119 Type 2 diabetes mellitus without complications: Secondary | ICD-10-CM | POA: Diagnosis not present

## 2011-03-09 DIAGNOSIS — I10 Essential (primary) hypertension: Secondary | ICD-10-CM | POA: Diagnosis not present

## 2011-03-13 DIAGNOSIS — F329 Major depressive disorder, single episode, unspecified: Secondary | ICD-10-CM | POA: Diagnosis not present

## 2011-03-13 DIAGNOSIS — S42309D Unspecified fracture of shaft of humerus, unspecified arm, subsequent encounter for fracture with routine healing: Secondary | ICD-10-CM | POA: Diagnosis not present

## 2011-03-13 DIAGNOSIS — E119 Type 2 diabetes mellitus without complications: Secondary | ICD-10-CM | POA: Diagnosis not present

## 2011-03-13 DIAGNOSIS — Z9181 History of falling: Secondary | ICD-10-CM | POA: Diagnosis not present

## 2011-03-13 DIAGNOSIS — I10 Essential (primary) hypertension: Secondary | ICD-10-CM | POA: Diagnosis not present

## 2011-03-15 DIAGNOSIS — E119 Type 2 diabetes mellitus without complications: Secondary | ICD-10-CM | POA: Diagnosis not present

## 2011-03-15 DIAGNOSIS — I10 Essential (primary) hypertension: Secondary | ICD-10-CM | POA: Diagnosis not present

## 2011-03-15 DIAGNOSIS — S42309D Unspecified fracture of shaft of humerus, unspecified arm, subsequent encounter for fracture with routine healing: Secondary | ICD-10-CM | POA: Diagnosis not present

## 2011-03-15 DIAGNOSIS — F329 Major depressive disorder, single episode, unspecified: Secondary | ICD-10-CM | POA: Diagnosis not present

## 2011-03-15 DIAGNOSIS — Z9181 History of falling: Secondary | ICD-10-CM | POA: Diagnosis not present

## 2011-03-16 DIAGNOSIS — I10 Essential (primary) hypertension: Secondary | ICD-10-CM | POA: Diagnosis not present

## 2011-03-16 DIAGNOSIS — E119 Type 2 diabetes mellitus without complications: Secondary | ICD-10-CM | POA: Diagnosis not present

## 2011-03-16 DIAGNOSIS — F329 Major depressive disorder, single episode, unspecified: Secondary | ICD-10-CM | POA: Diagnosis not present

## 2011-03-16 DIAGNOSIS — Z9181 History of falling: Secondary | ICD-10-CM | POA: Diagnosis not present

## 2011-03-16 DIAGNOSIS — S42309D Unspecified fracture of shaft of humerus, unspecified arm, subsequent encounter for fracture with routine healing: Secondary | ICD-10-CM | POA: Diagnosis not present

## 2011-03-19 DIAGNOSIS — I10 Essential (primary) hypertension: Secondary | ICD-10-CM | POA: Diagnosis not present

## 2011-03-19 DIAGNOSIS — E119 Type 2 diabetes mellitus without complications: Secondary | ICD-10-CM | POA: Diagnosis not present

## 2011-03-19 DIAGNOSIS — S42309D Unspecified fracture of shaft of humerus, unspecified arm, subsequent encounter for fracture with routine healing: Secondary | ICD-10-CM | POA: Diagnosis not present

## 2011-03-19 DIAGNOSIS — F329 Major depressive disorder, single episode, unspecified: Secondary | ICD-10-CM | POA: Diagnosis not present

## 2011-03-19 DIAGNOSIS — Z9181 History of falling: Secondary | ICD-10-CM | POA: Diagnosis not present

## 2011-03-21 DIAGNOSIS — F329 Major depressive disorder, single episode, unspecified: Secondary | ICD-10-CM | POA: Diagnosis not present

## 2011-03-21 DIAGNOSIS — S42309D Unspecified fracture of shaft of humerus, unspecified arm, subsequent encounter for fracture with routine healing: Secondary | ICD-10-CM | POA: Diagnosis not present

## 2011-03-21 DIAGNOSIS — I10 Essential (primary) hypertension: Secondary | ICD-10-CM | POA: Diagnosis not present

## 2011-03-21 DIAGNOSIS — Z9181 History of falling: Secondary | ICD-10-CM | POA: Diagnosis not present

## 2011-03-21 DIAGNOSIS — E119 Type 2 diabetes mellitus without complications: Secondary | ICD-10-CM | POA: Diagnosis not present

## 2011-03-22 DIAGNOSIS — S42309D Unspecified fracture of shaft of humerus, unspecified arm, subsequent encounter for fracture with routine healing: Secondary | ICD-10-CM | POA: Diagnosis not present

## 2011-03-22 DIAGNOSIS — I10 Essential (primary) hypertension: Secondary | ICD-10-CM | POA: Diagnosis not present

## 2011-03-22 DIAGNOSIS — F329 Major depressive disorder, single episode, unspecified: Secondary | ICD-10-CM | POA: Diagnosis not present

## 2011-03-22 DIAGNOSIS — N390 Urinary tract infection, site not specified: Secondary | ICD-10-CM | POA: Diagnosis not present

## 2011-03-22 DIAGNOSIS — Z9181 History of falling: Secondary | ICD-10-CM | POA: Diagnosis not present

## 2011-03-22 DIAGNOSIS — E119 Type 2 diabetes mellitus without complications: Secondary | ICD-10-CM | POA: Diagnosis not present

## 2011-03-22 DIAGNOSIS — D649 Anemia, unspecified: Secondary | ICD-10-CM | POA: Diagnosis not present

## 2011-03-27 DIAGNOSIS — S42309D Unspecified fracture of shaft of humerus, unspecified arm, subsequent encounter for fracture with routine healing: Secondary | ICD-10-CM | POA: Diagnosis not present

## 2011-03-27 DIAGNOSIS — Z9181 History of falling: Secondary | ICD-10-CM | POA: Diagnosis not present

## 2011-03-27 DIAGNOSIS — Z6841 Body Mass Index (BMI) 40.0 and over, adult: Secondary | ICD-10-CM | POA: Diagnosis not present

## 2011-03-27 DIAGNOSIS — M79609 Pain in unspecified limb: Secondary | ICD-10-CM | POA: Diagnosis not present

## 2011-03-27 DIAGNOSIS — I10 Essential (primary) hypertension: Secondary | ICD-10-CM | POA: Diagnosis not present

## 2011-03-27 DIAGNOSIS — N390 Urinary tract infection, site not specified: Secondary | ICD-10-CM | POA: Diagnosis not present

## 2011-03-27 DIAGNOSIS — D539 Nutritional anemia, unspecified: Secondary | ICD-10-CM | POA: Diagnosis not present

## 2011-03-27 DIAGNOSIS — E119 Type 2 diabetes mellitus without complications: Secondary | ICD-10-CM | POA: Diagnosis not present

## 2011-03-27 DIAGNOSIS — F329 Major depressive disorder, single episode, unspecified: Secondary | ICD-10-CM | POA: Diagnosis not present

## 2011-03-28 DIAGNOSIS — S42309D Unspecified fracture of shaft of humerus, unspecified arm, subsequent encounter for fracture with routine healing: Secondary | ICD-10-CM | POA: Diagnosis not present

## 2011-03-28 DIAGNOSIS — I10 Essential (primary) hypertension: Secondary | ICD-10-CM | POA: Diagnosis not present

## 2011-03-28 DIAGNOSIS — F329 Major depressive disorder, single episode, unspecified: Secondary | ICD-10-CM | POA: Diagnosis not present

## 2011-03-28 DIAGNOSIS — E119 Type 2 diabetes mellitus without complications: Secondary | ICD-10-CM | POA: Diagnosis not present

## 2011-03-28 DIAGNOSIS — Z9181 History of falling: Secondary | ICD-10-CM | POA: Diagnosis not present

## 2011-03-30 DIAGNOSIS — F329 Major depressive disorder, single episode, unspecified: Secondary | ICD-10-CM | POA: Diagnosis not present

## 2011-03-30 DIAGNOSIS — S42309D Unspecified fracture of shaft of humerus, unspecified arm, subsequent encounter for fracture with routine healing: Secondary | ICD-10-CM | POA: Diagnosis not present

## 2011-03-30 DIAGNOSIS — I10 Essential (primary) hypertension: Secondary | ICD-10-CM | POA: Diagnosis not present

## 2011-03-30 DIAGNOSIS — E119 Type 2 diabetes mellitus without complications: Secondary | ICD-10-CM | POA: Diagnosis not present

## 2011-03-30 DIAGNOSIS — Z9181 History of falling: Secondary | ICD-10-CM | POA: Diagnosis not present

## 2011-04-04 DIAGNOSIS — S42213A Unspecified displaced fracture of surgical neck of unspecified humerus, initial encounter for closed fracture: Secondary | ICD-10-CM | POA: Diagnosis not present

## 2011-04-04 DIAGNOSIS — S42209A Unspecified fracture of upper end of unspecified humerus, initial encounter for closed fracture: Secondary | ICD-10-CM | POA: Diagnosis not present

## 2011-04-04 DIAGNOSIS — IMO0001 Reserved for inherently not codable concepts without codable children: Secondary | ICD-10-CM | POA: Diagnosis not present

## 2011-04-05 DIAGNOSIS — I10 Essential (primary) hypertension: Secondary | ICD-10-CM | POA: Diagnosis not present

## 2011-04-05 DIAGNOSIS — E119 Type 2 diabetes mellitus without complications: Secondary | ICD-10-CM | POA: Diagnosis not present

## 2011-04-05 DIAGNOSIS — Z9181 History of falling: Secondary | ICD-10-CM | POA: Diagnosis not present

## 2011-04-05 DIAGNOSIS — S42309D Unspecified fracture of shaft of humerus, unspecified arm, subsequent encounter for fracture with routine healing: Secondary | ICD-10-CM | POA: Diagnosis not present

## 2011-04-05 DIAGNOSIS — F329 Major depressive disorder, single episode, unspecified: Secondary | ICD-10-CM | POA: Diagnosis not present

## 2011-04-12 DIAGNOSIS — E119 Type 2 diabetes mellitus without complications: Secondary | ICD-10-CM | POA: Diagnosis not present

## 2011-04-12 DIAGNOSIS — Z9181 History of falling: Secondary | ICD-10-CM | POA: Diagnosis not present

## 2011-04-12 DIAGNOSIS — F329 Major depressive disorder, single episode, unspecified: Secondary | ICD-10-CM | POA: Diagnosis not present

## 2011-04-12 DIAGNOSIS — S42309D Unspecified fracture of shaft of humerus, unspecified arm, subsequent encounter for fracture with routine healing: Secondary | ICD-10-CM | POA: Diagnosis not present

## 2011-04-12 DIAGNOSIS — I10 Essential (primary) hypertension: Secondary | ICD-10-CM | POA: Diagnosis not present

## 2011-04-16 DIAGNOSIS — N3090 Cystitis, unspecified without hematuria: Secondary | ICD-10-CM | POA: Diagnosis not present

## 2011-04-16 DIAGNOSIS — N390 Urinary tract infection, site not specified: Secondary | ICD-10-CM | POA: Diagnosis not present

## 2011-04-16 DIAGNOSIS — N3289 Other specified disorders of bladder: Secondary | ICD-10-CM | POA: Diagnosis not present

## 2011-04-18 DIAGNOSIS — F329 Major depressive disorder, single episode, unspecified: Secondary | ICD-10-CM | POA: Diagnosis not present

## 2011-04-18 DIAGNOSIS — Z9181 History of falling: Secondary | ICD-10-CM | POA: Diagnosis not present

## 2011-04-18 DIAGNOSIS — S42309D Unspecified fracture of shaft of humerus, unspecified arm, subsequent encounter for fracture with routine healing: Secondary | ICD-10-CM | POA: Diagnosis not present

## 2011-04-18 DIAGNOSIS — I10 Essential (primary) hypertension: Secondary | ICD-10-CM | POA: Diagnosis not present

## 2011-04-18 DIAGNOSIS — E119 Type 2 diabetes mellitus without complications: Secondary | ICD-10-CM | POA: Diagnosis not present

## 2011-04-24 DIAGNOSIS — I10 Essential (primary) hypertension: Secondary | ICD-10-CM | POA: Diagnosis not present

## 2011-04-24 DIAGNOSIS — F329 Major depressive disorder, single episode, unspecified: Secondary | ICD-10-CM | POA: Diagnosis not present

## 2011-04-24 DIAGNOSIS — Z9181 History of falling: Secondary | ICD-10-CM | POA: Diagnosis not present

## 2011-04-24 DIAGNOSIS — S42309D Unspecified fracture of shaft of humerus, unspecified arm, subsequent encounter for fracture with routine healing: Secondary | ICD-10-CM | POA: Diagnosis not present

## 2011-04-24 DIAGNOSIS — E119 Type 2 diabetes mellitus without complications: Secondary | ICD-10-CM | POA: Diagnosis not present

## 2011-04-29 DIAGNOSIS — F329 Major depressive disorder, single episode, unspecified: Secondary | ICD-10-CM | POA: Diagnosis not present

## 2011-04-29 DIAGNOSIS — Z9181 History of falling: Secondary | ICD-10-CM | POA: Diagnosis not present

## 2011-04-29 DIAGNOSIS — D510 Vitamin B12 deficiency anemia due to intrinsic factor deficiency: Secondary | ICD-10-CM | POA: Diagnosis not present

## 2011-04-29 DIAGNOSIS — E119 Type 2 diabetes mellitus without complications: Secondary | ICD-10-CM | POA: Diagnosis not present

## 2011-04-29 DIAGNOSIS — I10 Essential (primary) hypertension: Secondary | ICD-10-CM | POA: Diagnosis not present

## 2011-05-04 DIAGNOSIS — D510 Vitamin B12 deficiency anemia due to intrinsic factor deficiency: Secondary | ICD-10-CM | POA: Diagnosis not present

## 2011-05-04 DIAGNOSIS — E119 Type 2 diabetes mellitus without complications: Secondary | ICD-10-CM | POA: Diagnosis not present

## 2011-05-04 DIAGNOSIS — I10 Essential (primary) hypertension: Secondary | ICD-10-CM | POA: Diagnosis not present

## 2011-05-04 DIAGNOSIS — Z9181 History of falling: Secondary | ICD-10-CM | POA: Diagnosis not present

## 2011-05-04 DIAGNOSIS — F329 Major depressive disorder, single episode, unspecified: Secondary | ICD-10-CM | POA: Diagnosis not present

## 2011-05-14 DIAGNOSIS — D649 Anemia, unspecified: Secondary | ICD-10-CM | POA: Diagnosis not present

## 2011-05-14 DIAGNOSIS — F329 Major depressive disorder, single episode, unspecified: Secondary | ICD-10-CM | POA: Diagnosis not present

## 2011-05-14 DIAGNOSIS — E119 Type 2 diabetes mellitus without complications: Secondary | ICD-10-CM | POA: Diagnosis not present

## 2011-05-14 DIAGNOSIS — Z9181 History of falling: Secondary | ICD-10-CM | POA: Diagnosis not present

## 2011-05-14 DIAGNOSIS — I10 Essential (primary) hypertension: Secondary | ICD-10-CM | POA: Diagnosis not present

## 2011-05-14 DIAGNOSIS — D510 Vitamin B12 deficiency anemia due to intrinsic factor deficiency: Secondary | ICD-10-CM | POA: Diagnosis not present

## 2011-05-22 DIAGNOSIS — M25519 Pain in unspecified shoulder: Secondary | ICD-10-CM | POA: Diagnosis not present

## 2011-05-22 DIAGNOSIS — G4700 Insomnia, unspecified: Secondary | ICD-10-CM | POA: Diagnosis not present

## 2011-05-22 DIAGNOSIS — D539 Nutritional anemia, unspecified: Secondary | ICD-10-CM | POA: Diagnosis not present

## 2011-05-22 DIAGNOSIS — Z6841 Body Mass Index (BMI) 40.0 and over, adult: Secondary | ICD-10-CM | POA: Diagnosis not present

## 2011-05-24 DIAGNOSIS — E119 Type 2 diabetes mellitus without complications: Secondary | ICD-10-CM | POA: Diagnosis not present

## 2011-05-24 DIAGNOSIS — Z9181 History of falling: Secondary | ICD-10-CM | POA: Diagnosis not present

## 2011-05-24 DIAGNOSIS — F329 Major depressive disorder, single episode, unspecified: Secondary | ICD-10-CM | POA: Diagnosis not present

## 2011-05-24 DIAGNOSIS — I10 Essential (primary) hypertension: Secondary | ICD-10-CM | POA: Diagnosis not present

## 2011-05-24 DIAGNOSIS — D510 Vitamin B12 deficiency anemia due to intrinsic factor deficiency: Secondary | ICD-10-CM | POA: Diagnosis not present

## 2011-06-13 DIAGNOSIS — M7500 Adhesive capsulitis of unspecified shoulder: Secondary | ICD-10-CM | POA: Diagnosis not present

## 2011-06-13 DIAGNOSIS — IMO0001 Reserved for inherently not codable concepts without codable children: Secondary | ICD-10-CM | POA: Diagnosis not present

## 2011-06-13 DIAGNOSIS — S42213A Unspecified displaced fracture of surgical neck of unspecified humerus, initial encounter for closed fracture: Secondary | ICD-10-CM | POA: Diagnosis not present

## 2011-06-22 DIAGNOSIS — F329 Major depressive disorder, single episode, unspecified: Secondary | ICD-10-CM | POA: Diagnosis not present

## 2011-06-22 DIAGNOSIS — E119 Type 2 diabetes mellitus without complications: Secondary | ICD-10-CM | POA: Diagnosis not present

## 2011-06-22 DIAGNOSIS — Z9181 History of falling: Secondary | ICD-10-CM | POA: Diagnosis not present

## 2011-06-22 DIAGNOSIS — D510 Vitamin B12 deficiency anemia due to intrinsic factor deficiency: Secondary | ICD-10-CM | POA: Diagnosis not present

## 2011-06-22 DIAGNOSIS — I10 Essential (primary) hypertension: Secondary | ICD-10-CM | POA: Diagnosis not present

## 2011-06-25 DIAGNOSIS — E119 Type 2 diabetes mellitus without complications: Secondary | ICD-10-CM | POA: Diagnosis not present

## 2011-06-25 DIAGNOSIS — E785 Hyperlipidemia, unspecified: Secondary | ICD-10-CM | POA: Diagnosis not present

## 2011-06-25 DIAGNOSIS — F329 Major depressive disorder, single episode, unspecified: Secondary | ICD-10-CM | POA: Diagnosis not present

## 2011-06-25 DIAGNOSIS — I10 Essential (primary) hypertension: Secondary | ICD-10-CM | POA: Diagnosis not present

## 2011-06-25 DIAGNOSIS — D510 Vitamin B12 deficiency anemia due to intrinsic factor deficiency: Secondary | ICD-10-CM | POA: Diagnosis not present

## 2011-06-25 DIAGNOSIS — Z9181 History of falling: Secondary | ICD-10-CM | POA: Diagnosis not present

## 2011-07-17 DIAGNOSIS — M79609 Pain in unspecified limb: Secondary | ICD-10-CM | POA: Diagnosis not present

## 2011-07-17 DIAGNOSIS — E119 Type 2 diabetes mellitus without complications: Secondary | ICD-10-CM | POA: Diagnosis not present

## 2011-07-17 DIAGNOSIS — D539 Nutritional anemia, unspecified: Secondary | ICD-10-CM | POA: Diagnosis not present

## 2011-07-17 DIAGNOSIS — I10 Essential (primary) hypertension: Secondary | ICD-10-CM | POA: Diagnosis not present

## 2011-07-24 DIAGNOSIS — D649 Anemia, unspecified: Secondary | ICD-10-CM | POA: Diagnosis not present

## 2011-07-31 DIAGNOSIS — D509 Iron deficiency anemia, unspecified: Secondary | ICD-10-CM | POA: Diagnosis not present

## 2011-08-01 DIAGNOSIS — D509 Iron deficiency anemia, unspecified: Secondary | ICD-10-CM | POA: Diagnosis not present

## 2011-08-08 DIAGNOSIS — D509 Iron deficiency anemia, unspecified: Secondary | ICD-10-CM | POA: Diagnosis not present

## 2011-09-25 DIAGNOSIS — D509 Iron deficiency anemia, unspecified: Secondary | ICD-10-CM | POA: Diagnosis not present

## 2011-10-01 DIAGNOSIS — D509 Iron deficiency anemia, unspecified: Secondary | ICD-10-CM | POA: Diagnosis not present

## 2011-10-05 DIAGNOSIS — D649 Anemia, unspecified: Secondary | ICD-10-CM | POA: Diagnosis not present

## 2011-10-15 DIAGNOSIS — D509 Iron deficiency anemia, unspecified: Secondary | ICD-10-CM | POA: Diagnosis not present

## 2011-10-17 DIAGNOSIS — IMO0001 Reserved for inherently not codable concepts without codable children: Secondary | ICD-10-CM | POA: Diagnosis not present

## 2011-10-17 DIAGNOSIS — M7500 Adhesive capsulitis of unspecified shoulder: Secondary | ICD-10-CM | POA: Diagnosis not present

## 2011-10-17 DIAGNOSIS — S42209A Unspecified fracture of upper end of unspecified humerus, initial encounter for closed fracture: Secondary | ICD-10-CM | POA: Diagnosis not present

## 2011-10-18 DIAGNOSIS — I10 Essential (primary) hypertension: Secondary | ICD-10-CM | POA: Diagnosis not present

## 2011-10-18 DIAGNOSIS — Z79899 Other long term (current) drug therapy: Secondary | ICD-10-CM | POA: Diagnosis not present

## 2011-10-18 DIAGNOSIS — Z6841 Body Mass Index (BMI) 40.0 and over, adult: Secondary | ICD-10-CM | POA: Diagnosis not present

## 2011-10-18 DIAGNOSIS — E785 Hyperlipidemia, unspecified: Secondary | ICD-10-CM | POA: Diagnosis not present

## 2011-10-18 DIAGNOSIS — E119 Type 2 diabetes mellitus without complications: Secondary | ICD-10-CM | POA: Diagnosis not present

## 2011-10-19 DIAGNOSIS — E119 Type 2 diabetes mellitus without complications: Secondary | ICD-10-CM | POA: Diagnosis not present

## 2011-10-19 DIAGNOSIS — I10 Essential (primary) hypertension: Secondary | ICD-10-CM | POA: Diagnosis not present

## 2011-10-19 DIAGNOSIS — F329 Major depressive disorder, single episode, unspecified: Secondary | ICD-10-CM | POA: Diagnosis not present

## 2011-10-19 DIAGNOSIS — D649 Anemia, unspecified: Secondary | ICD-10-CM | POA: Diagnosis not present

## 2011-10-19 DIAGNOSIS — R197 Diarrhea, unspecified: Secondary | ICD-10-CM | POA: Diagnosis not present

## 2011-10-25 DIAGNOSIS — I10 Essential (primary) hypertension: Secondary | ICD-10-CM | POA: Diagnosis not present

## 2011-10-25 DIAGNOSIS — R197 Diarrhea, unspecified: Secondary | ICD-10-CM | POA: Diagnosis not present

## 2011-10-25 DIAGNOSIS — D649 Anemia, unspecified: Secondary | ICD-10-CM | POA: Diagnosis not present

## 2011-10-25 DIAGNOSIS — F329 Major depressive disorder, single episode, unspecified: Secondary | ICD-10-CM | POA: Diagnosis not present

## 2011-10-25 DIAGNOSIS — E119 Type 2 diabetes mellitus without complications: Secondary | ICD-10-CM | POA: Diagnosis not present

## 2011-10-25 DIAGNOSIS — D500 Iron deficiency anemia secondary to blood loss (chronic): Secondary | ICD-10-CM | POA: Diagnosis not present

## 2011-10-29 DIAGNOSIS — D509 Iron deficiency anemia, unspecified: Secondary | ICD-10-CM | POA: Diagnosis not present

## 2011-11-01 DIAGNOSIS — R197 Diarrhea, unspecified: Secondary | ICD-10-CM | POA: Diagnosis not present

## 2011-11-01 DIAGNOSIS — I10 Essential (primary) hypertension: Secondary | ICD-10-CM | POA: Diagnosis not present

## 2011-11-01 DIAGNOSIS — E119 Type 2 diabetes mellitus without complications: Secondary | ICD-10-CM | POA: Diagnosis not present

## 2011-11-01 DIAGNOSIS — F329 Major depressive disorder, single episode, unspecified: Secondary | ICD-10-CM | POA: Diagnosis not present

## 2011-11-01 DIAGNOSIS — D649 Anemia, unspecified: Secondary | ICD-10-CM | POA: Diagnosis not present

## 2011-11-05 DIAGNOSIS — R079 Chest pain, unspecified: Secondary | ICD-10-CM | POA: Diagnosis not present

## 2011-11-05 DIAGNOSIS — Z79899 Other long term (current) drug therapy: Secondary | ICD-10-CM | POA: Diagnosis not present

## 2011-11-05 DIAGNOSIS — E039 Hypothyroidism, unspecified: Secondary | ICD-10-CM | POA: Diagnosis not present

## 2011-11-05 DIAGNOSIS — E119 Type 2 diabetes mellitus without complications: Secondary | ICD-10-CM | POA: Diagnosis not present

## 2011-11-05 DIAGNOSIS — I472 Ventricular tachycardia: Secondary | ICD-10-CM | POA: Diagnosis not present

## 2011-11-05 DIAGNOSIS — I10 Essential (primary) hypertension: Secondary | ICD-10-CM | POA: Diagnosis not present

## 2011-11-08 DIAGNOSIS — R197 Diarrhea, unspecified: Secondary | ICD-10-CM | POA: Diagnosis not present

## 2011-11-08 DIAGNOSIS — I10 Essential (primary) hypertension: Secondary | ICD-10-CM | POA: Diagnosis not present

## 2011-11-08 DIAGNOSIS — F329 Major depressive disorder, single episode, unspecified: Secondary | ICD-10-CM | POA: Diagnosis not present

## 2011-11-08 DIAGNOSIS — E119 Type 2 diabetes mellitus without complications: Secondary | ICD-10-CM | POA: Diagnosis not present

## 2011-11-08 DIAGNOSIS — D649 Anemia, unspecified: Secondary | ICD-10-CM | POA: Diagnosis not present

## 2011-11-16 DIAGNOSIS — R079 Chest pain, unspecified: Secondary | ICD-10-CM | POA: Diagnosis not present

## 2011-11-17 DIAGNOSIS — F329 Major depressive disorder, single episode, unspecified: Secondary | ICD-10-CM | POA: Diagnosis not present

## 2011-11-17 DIAGNOSIS — E119 Type 2 diabetes mellitus without complications: Secondary | ICD-10-CM | POA: Diagnosis not present

## 2011-11-17 DIAGNOSIS — R197 Diarrhea, unspecified: Secondary | ICD-10-CM | POA: Diagnosis not present

## 2011-11-17 DIAGNOSIS — I10 Essential (primary) hypertension: Secondary | ICD-10-CM | POA: Diagnosis not present

## 2011-11-17 DIAGNOSIS — R000 Tachycardia, unspecified: Secondary | ICD-10-CM | POA: Diagnosis not present

## 2011-11-17 DIAGNOSIS — D649 Anemia, unspecified: Secondary | ICD-10-CM | POA: Diagnosis not present

## 2011-11-20 DIAGNOSIS — I10 Essential (primary) hypertension: Secondary | ICD-10-CM | POA: Diagnosis not present

## 2011-11-20 DIAGNOSIS — D649 Anemia, unspecified: Secondary | ICD-10-CM | POA: Diagnosis not present

## 2011-11-20 DIAGNOSIS — R197 Diarrhea, unspecified: Secondary | ICD-10-CM | POA: Diagnosis not present

## 2011-11-20 DIAGNOSIS — F329 Major depressive disorder, single episode, unspecified: Secondary | ICD-10-CM | POA: Diagnosis not present

## 2011-11-20 DIAGNOSIS — E119 Type 2 diabetes mellitus without complications: Secondary | ICD-10-CM | POA: Diagnosis not present

## 2011-11-27 DIAGNOSIS — T148XXA Other injury of unspecified body region, initial encounter: Secondary | ICD-10-CM | POA: Diagnosis not present

## 2011-11-27 DIAGNOSIS — M549 Dorsalgia, unspecified: Secondary | ICD-10-CM | POA: Diagnosis not present

## 2011-11-27 DIAGNOSIS — M25569 Pain in unspecified knee: Secondary | ICD-10-CM | POA: Diagnosis not present

## 2011-11-27 DIAGNOSIS — M79609 Pain in unspecified limb: Secondary | ICD-10-CM | POA: Diagnosis not present

## 2011-11-27 DIAGNOSIS — R55 Syncope and collapse: Secondary | ICD-10-CM | POA: Diagnosis not present

## 2011-11-27 DIAGNOSIS — M542 Cervicalgia: Secondary | ICD-10-CM | POA: Diagnosis not present

## 2011-11-27 DIAGNOSIS — R296 Repeated falls: Secondary | ICD-10-CM | POA: Diagnosis not present

## 2011-11-27 DIAGNOSIS — M25519 Pain in unspecified shoulder: Secondary | ICD-10-CM | POA: Diagnosis not present

## 2011-11-27 DIAGNOSIS — W19XXXA Unspecified fall, initial encounter: Secondary | ICD-10-CM | POA: Diagnosis not present

## 2011-11-27 DIAGNOSIS — M0500 Felty's syndrome, unspecified site: Secondary | ICD-10-CM | POA: Diagnosis not present

## 2011-11-27 DIAGNOSIS — J329 Chronic sinusitis, unspecified: Secondary | ICD-10-CM | POA: Diagnosis not present

## 2011-11-30 DIAGNOSIS — D649 Anemia, unspecified: Secondary | ICD-10-CM | POA: Diagnosis not present

## 2011-11-30 DIAGNOSIS — E119 Type 2 diabetes mellitus without complications: Secondary | ICD-10-CM | POA: Diagnosis not present

## 2011-11-30 DIAGNOSIS — F329 Major depressive disorder, single episode, unspecified: Secondary | ICD-10-CM | POA: Diagnosis not present

## 2011-11-30 DIAGNOSIS — R197 Diarrhea, unspecified: Secondary | ICD-10-CM | POA: Diagnosis not present

## 2011-11-30 DIAGNOSIS — I10 Essential (primary) hypertension: Secondary | ICD-10-CM | POA: Diagnosis not present

## 2011-12-06 DIAGNOSIS — E119 Type 2 diabetes mellitus without complications: Secondary | ICD-10-CM | POA: Diagnosis not present

## 2011-12-06 DIAGNOSIS — D649 Anemia, unspecified: Secondary | ICD-10-CM | POA: Diagnosis not present

## 2011-12-06 DIAGNOSIS — F329 Major depressive disorder, single episode, unspecified: Secondary | ICD-10-CM | POA: Diagnosis not present

## 2011-12-06 DIAGNOSIS — I10 Essential (primary) hypertension: Secondary | ICD-10-CM | POA: Diagnosis not present

## 2011-12-06 DIAGNOSIS — R197 Diarrhea, unspecified: Secondary | ICD-10-CM | POA: Diagnosis not present

## 2011-12-10 DIAGNOSIS — M25579 Pain in unspecified ankle and joints of unspecified foot: Secondary | ICD-10-CM | POA: Diagnosis not present

## 2011-12-10 DIAGNOSIS — S92309A Fracture of unspecified metatarsal bone(s), unspecified foot, initial encounter for closed fracture: Secondary | ICD-10-CM | POA: Diagnosis not present

## 2011-12-18 DIAGNOSIS — E119 Type 2 diabetes mellitus without complications: Secondary | ICD-10-CM | POA: Diagnosis not present

## 2011-12-18 DIAGNOSIS — D649 Anemia, unspecified: Secondary | ICD-10-CM | POA: Diagnosis not present

## 2011-12-18 DIAGNOSIS — F329 Major depressive disorder, single episode, unspecified: Secondary | ICD-10-CM | POA: Diagnosis not present

## 2011-12-18 DIAGNOSIS — I10 Essential (primary) hypertension: Secondary | ICD-10-CM | POA: Diagnosis not present

## 2011-12-18 DIAGNOSIS — I498 Other specified cardiac arrhythmias: Secondary | ICD-10-CM | POA: Diagnosis not present

## 2011-12-21 DIAGNOSIS — F329 Major depressive disorder, single episode, unspecified: Secondary | ICD-10-CM | POA: Diagnosis not present

## 2011-12-21 DIAGNOSIS — I10 Essential (primary) hypertension: Secondary | ICD-10-CM | POA: Diagnosis not present

## 2011-12-21 DIAGNOSIS — E119 Type 2 diabetes mellitus without complications: Secondary | ICD-10-CM | POA: Diagnosis not present

## 2011-12-21 DIAGNOSIS — D649 Anemia, unspecified: Secondary | ICD-10-CM | POA: Diagnosis not present

## 2011-12-21 DIAGNOSIS — I498 Other specified cardiac arrhythmias: Secondary | ICD-10-CM | POA: Diagnosis not present

## 2011-12-28 DIAGNOSIS — E119 Type 2 diabetes mellitus without complications: Secondary | ICD-10-CM | POA: Diagnosis not present

## 2011-12-28 DIAGNOSIS — I498 Other specified cardiac arrhythmias: Secondary | ICD-10-CM | POA: Diagnosis not present

## 2011-12-28 DIAGNOSIS — I10 Essential (primary) hypertension: Secondary | ICD-10-CM | POA: Diagnosis not present

## 2011-12-28 DIAGNOSIS — F329 Major depressive disorder, single episode, unspecified: Secondary | ICD-10-CM | POA: Diagnosis not present

## 2011-12-28 DIAGNOSIS — D649 Anemia, unspecified: Secondary | ICD-10-CM | POA: Diagnosis not present

## 2012-01-03 DIAGNOSIS — E119 Type 2 diabetes mellitus without complications: Secondary | ICD-10-CM | POA: Diagnosis not present

## 2012-01-03 DIAGNOSIS — I498 Other specified cardiac arrhythmias: Secondary | ICD-10-CM | POA: Diagnosis not present

## 2012-01-03 DIAGNOSIS — D649 Anemia, unspecified: Secondary | ICD-10-CM | POA: Diagnosis not present

## 2012-01-03 DIAGNOSIS — F329 Major depressive disorder, single episode, unspecified: Secondary | ICD-10-CM | POA: Diagnosis not present

## 2012-01-03 DIAGNOSIS — I10 Essential (primary) hypertension: Secondary | ICD-10-CM | POA: Diagnosis not present

## 2012-01-11 DIAGNOSIS — I498 Other specified cardiac arrhythmias: Secondary | ICD-10-CM | POA: Diagnosis not present

## 2012-01-11 DIAGNOSIS — D649 Anemia, unspecified: Secondary | ICD-10-CM | POA: Diagnosis not present

## 2012-01-11 DIAGNOSIS — I10 Essential (primary) hypertension: Secondary | ICD-10-CM | POA: Diagnosis not present

## 2012-01-11 DIAGNOSIS — E119 Type 2 diabetes mellitus without complications: Secondary | ICD-10-CM | POA: Diagnosis not present

## 2012-01-11 DIAGNOSIS — F329 Major depressive disorder, single episode, unspecified: Secondary | ICD-10-CM | POA: Diagnosis not present

## 2012-01-18 DIAGNOSIS — I10 Essential (primary) hypertension: Secondary | ICD-10-CM | POA: Diagnosis not present

## 2012-01-18 DIAGNOSIS — E119 Type 2 diabetes mellitus without complications: Secondary | ICD-10-CM | POA: Diagnosis not present

## 2012-01-18 DIAGNOSIS — F329 Major depressive disorder, single episode, unspecified: Secondary | ICD-10-CM | POA: Diagnosis not present

## 2012-01-18 DIAGNOSIS — I498 Other specified cardiac arrhythmias: Secondary | ICD-10-CM | POA: Diagnosis not present

## 2012-01-18 DIAGNOSIS — D649 Anemia, unspecified: Secondary | ICD-10-CM | POA: Diagnosis not present

## 2012-01-24 DIAGNOSIS — D649 Anemia, unspecified: Secondary | ICD-10-CM | POA: Diagnosis not present

## 2012-01-24 DIAGNOSIS — I498 Other specified cardiac arrhythmias: Secondary | ICD-10-CM | POA: Diagnosis not present

## 2012-01-24 DIAGNOSIS — E119 Type 2 diabetes mellitus without complications: Secondary | ICD-10-CM | POA: Diagnosis not present

## 2012-01-24 DIAGNOSIS — F329 Major depressive disorder, single episode, unspecified: Secondary | ICD-10-CM | POA: Diagnosis not present

## 2012-01-24 DIAGNOSIS — I10 Essential (primary) hypertension: Secondary | ICD-10-CM | POA: Diagnosis not present

## 2012-01-25 DIAGNOSIS — E119 Type 2 diabetes mellitus without complications: Secondary | ICD-10-CM | POA: Diagnosis not present

## 2012-01-25 DIAGNOSIS — D539 Nutritional anemia, unspecified: Secondary | ICD-10-CM | POA: Diagnosis not present

## 2012-01-25 DIAGNOSIS — Z6841 Body Mass Index (BMI) 40.0 and over, adult: Secondary | ICD-10-CM | POA: Diagnosis not present

## 2012-01-25 DIAGNOSIS — E785 Hyperlipidemia, unspecified: Secondary | ICD-10-CM | POA: Diagnosis not present

## 2012-01-25 DIAGNOSIS — I10 Essential (primary) hypertension: Secondary | ICD-10-CM | POA: Diagnosis not present

## 2012-01-31 DIAGNOSIS — F329 Major depressive disorder, single episode, unspecified: Secondary | ICD-10-CM | POA: Diagnosis not present

## 2012-01-31 DIAGNOSIS — D649 Anemia, unspecified: Secondary | ICD-10-CM | POA: Diagnosis not present

## 2012-01-31 DIAGNOSIS — I498 Other specified cardiac arrhythmias: Secondary | ICD-10-CM | POA: Diagnosis not present

## 2012-01-31 DIAGNOSIS — E119 Type 2 diabetes mellitus without complications: Secondary | ICD-10-CM | POA: Diagnosis not present

## 2012-01-31 DIAGNOSIS — I10 Essential (primary) hypertension: Secondary | ICD-10-CM | POA: Diagnosis not present

## 2012-02-07 DIAGNOSIS — F329 Major depressive disorder, single episode, unspecified: Secondary | ICD-10-CM | POA: Diagnosis not present

## 2012-02-07 DIAGNOSIS — I498 Other specified cardiac arrhythmias: Secondary | ICD-10-CM | POA: Diagnosis not present

## 2012-02-07 DIAGNOSIS — R079 Chest pain, unspecified: Secondary | ICD-10-CM | POA: Diagnosis not present

## 2012-02-07 DIAGNOSIS — Z79899 Other long term (current) drug therapy: Secondary | ICD-10-CM | POA: Diagnosis not present

## 2012-02-07 DIAGNOSIS — I10 Essential (primary) hypertension: Secondary | ICD-10-CM | POA: Diagnosis not present

## 2012-02-07 DIAGNOSIS — D649 Anemia, unspecified: Secondary | ICD-10-CM | POA: Diagnosis not present

## 2012-02-07 DIAGNOSIS — E119 Type 2 diabetes mellitus without complications: Secondary | ICD-10-CM | POA: Diagnosis not present

## 2012-02-07 DIAGNOSIS — I472 Ventricular tachycardia: Secondary | ICD-10-CM | POA: Diagnosis not present

## 2012-02-18 DIAGNOSIS — D649 Anemia, unspecified: Secondary | ICD-10-CM | POA: Diagnosis not present

## 2012-02-21 DIAGNOSIS — Z09 Encounter for follow-up examination after completed treatment for conditions other than malignant neoplasm: Secondary | ICD-10-CM | POA: Diagnosis not present

## 2012-02-21 DIAGNOSIS — Z862 Personal history of diseases of the blood and blood-forming organs and certain disorders involving the immune mechanism: Secondary | ICD-10-CM | POA: Diagnosis not present

## 2012-03-06 DIAGNOSIS — I472 Ventricular tachycardia: Secondary | ICD-10-CM | POA: Diagnosis not present

## 2012-03-13 DIAGNOSIS — G4733 Obstructive sleep apnea (adult) (pediatric): Secondary | ICD-10-CM | POA: Diagnosis not present

## 2012-04-16 DIAGNOSIS — G4730 Sleep apnea, unspecified: Secondary | ICD-10-CM | POA: Diagnosis not present

## 2012-04-16 DIAGNOSIS — F329 Major depressive disorder, single episode, unspecified: Secondary | ICD-10-CM | POA: Diagnosis not present

## 2012-04-16 DIAGNOSIS — I498 Other specified cardiac arrhythmias: Secondary | ICD-10-CM | POA: Diagnosis not present

## 2012-04-16 DIAGNOSIS — E119 Type 2 diabetes mellitus without complications: Secondary | ICD-10-CM | POA: Diagnosis not present

## 2012-04-16 DIAGNOSIS — I10 Essential (primary) hypertension: Secondary | ICD-10-CM | POA: Diagnosis not present

## 2012-04-21 DIAGNOSIS — G4730 Sleep apnea, unspecified: Secondary | ICD-10-CM | POA: Diagnosis not present

## 2012-04-21 DIAGNOSIS — I10 Essential (primary) hypertension: Secondary | ICD-10-CM | POA: Diagnosis not present

## 2012-04-21 DIAGNOSIS — F329 Major depressive disorder, single episode, unspecified: Secondary | ICD-10-CM | POA: Diagnosis not present

## 2012-04-21 DIAGNOSIS — I498 Other specified cardiac arrhythmias: Secondary | ICD-10-CM | POA: Diagnosis not present

## 2012-04-21 DIAGNOSIS — E119 Type 2 diabetes mellitus without complications: Secondary | ICD-10-CM | POA: Diagnosis not present

## 2012-04-24 DIAGNOSIS — R259 Unspecified abnormal involuntary movements: Secondary | ICD-10-CM | POA: Diagnosis not present

## 2012-04-24 DIAGNOSIS — R079 Chest pain, unspecified: Secondary | ICD-10-CM | POA: Diagnosis not present

## 2012-04-24 DIAGNOSIS — H5710 Ocular pain, unspecified eye: Secondary | ICD-10-CM | POA: Diagnosis not present

## 2012-04-24 DIAGNOSIS — Z6841 Body Mass Index (BMI) 40.0 and over, adult: Secondary | ICD-10-CM | POA: Diagnosis not present

## 2012-04-29 DIAGNOSIS — E119 Type 2 diabetes mellitus without complications: Secondary | ICD-10-CM | POA: Diagnosis not present

## 2012-04-29 DIAGNOSIS — Z6841 Body Mass Index (BMI) 40.0 and over, adult: Secondary | ICD-10-CM | POA: Diagnosis not present

## 2012-04-29 DIAGNOSIS — D539 Nutritional anemia, unspecified: Secondary | ICD-10-CM | POA: Diagnosis not present

## 2012-04-29 DIAGNOSIS — E785 Hyperlipidemia, unspecified: Secondary | ICD-10-CM | POA: Diagnosis not present

## 2012-04-29 DIAGNOSIS — I10 Essential (primary) hypertension: Secondary | ICD-10-CM | POA: Diagnosis not present

## 2012-05-01 DIAGNOSIS — I10 Essential (primary) hypertension: Secondary | ICD-10-CM | POA: Diagnosis not present

## 2012-05-01 DIAGNOSIS — F329 Major depressive disorder, single episode, unspecified: Secondary | ICD-10-CM | POA: Diagnosis not present

## 2012-05-01 DIAGNOSIS — G4730 Sleep apnea, unspecified: Secondary | ICD-10-CM | POA: Diagnosis not present

## 2012-05-01 DIAGNOSIS — E119 Type 2 diabetes mellitus without complications: Secondary | ICD-10-CM | POA: Diagnosis not present

## 2012-05-01 DIAGNOSIS — I498 Other specified cardiac arrhythmias: Secondary | ICD-10-CM | POA: Diagnosis not present

## 2012-05-05 DIAGNOSIS — G4730 Sleep apnea, unspecified: Secondary | ICD-10-CM | POA: Diagnosis not present

## 2012-05-05 DIAGNOSIS — E119 Type 2 diabetes mellitus without complications: Secondary | ICD-10-CM | POA: Diagnosis not present

## 2012-05-05 DIAGNOSIS — F329 Major depressive disorder, single episode, unspecified: Secondary | ICD-10-CM | POA: Diagnosis not present

## 2012-05-05 DIAGNOSIS — I10 Essential (primary) hypertension: Secondary | ICD-10-CM | POA: Diagnosis not present

## 2012-05-05 DIAGNOSIS — I498 Other specified cardiac arrhythmias: Secondary | ICD-10-CM | POA: Diagnosis not present

## 2012-05-14 DIAGNOSIS — F329 Major depressive disorder, single episode, unspecified: Secondary | ICD-10-CM | POA: Diagnosis not present

## 2012-05-14 DIAGNOSIS — I498 Other specified cardiac arrhythmias: Secondary | ICD-10-CM | POA: Diagnosis not present

## 2012-05-14 DIAGNOSIS — E119 Type 2 diabetes mellitus without complications: Secondary | ICD-10-CM | POA: Diagnosis not present

## 2012-05-14 DIAGNOSIS — I10 Essential (primary) hypertension: Secondary | ICD-10-CM | POA: Diagnosis not present

## 2012-05-14 DIAGNOSIS — G4730 Sleep apnea, unspecified: Secondary | ICD-10-CM | POA: Diagnosis not present

## 2012-05-20 DIAGNOSIS — G4710 Hypersomnia, unspecified: Secondary | ICD-10-CM | POA: Diagnosis not present

## 2012-05-20 DIAGNOSIS — G4730 Sleep apnea, unspecified: Secondary | ICD-10-CM | POA: Diagnosis not present

## 2012-05-20 DIAGNOSIS — R5381 Other malaise: Secondary | ICD-10-CM | POA: Diagnosis not present

## 2012-05-20 DIAGNOSIS — R0609 Other forms of dyspnea: Secondary | ICD-10-CM | POA: Diagnosis not present

## 2012-05-20 DIAGNOSIS — R0989 Other specified symptoms and signs involving the circulatory and respiratory systems: Secondary | ICD-10-CM | POA: Diagnosis not present

## 2012-05-20 DIAGNOSIS — E559 Vitamin D deficiency, unspecified: Secondary | ICD-10-CM | POA: Diagnosis not present

## 2012-05-21 DIAGNOSIS — I10 Essential (primary) hypertension: Secondary | ICD-10-CM | POA: Diagnosis not present

## 2012-05-21 DIAGNOSIS — E119 Type 2 diabetes mellitus without complications: Secondary | ICD-10-CM | POA: Diagnosis not present

## 2012-05-21 DIAGNOSIS — I498 Other specified cardiac arrhythmias: Secondary | ICD-10-CM | POA: Diagnosis not present

## 2012-05-21 DIAGNOSIS — F329 Major depressive disorder, single episode, unspecified: Secondary | ICD-10-CM | POA: Diagnosis not present

## 2012-05-21 DIAGNOSIS — G4730 Sleep apnea, unspecified: Secondary | ICD-10-CM | POA: Diagnosis not present

## 2012-05-29 DIAGNOSIS — E119 Type 2 diabetes mellitus without complications: Secondary | ICD-10-CM | POA: Diagnosis not present

## 2012-05-29 DIAGNOSIS — F329 Major depressive disorder, single episode, unspecified: Secondary | ICD-10-CM | POA: Diagnosis not present

## 2012-05-29 DIAGNOSIS — G4730 Sleep apnea, unspecified: Secondary | ICD-10-CM | POA: Diagnosis not present

## 2012-05-29 DIAGNOSIS — I10 Essential (primary) hypertension: Secondary | ICD-10-CM | POA: Diagnosis not present

## 2012-05-29 DIAGNOSIS — I498 Other specified cardiac arrhythmias: Secondary | ICD-10-CM | POA: Diagnosis not present

## 2012-06-04 DIAGNOSIS — I498 Other specified cardiac arrhythmias: Secondary | ICD-10-CM | POA: Diagnosis not present

## 2012-06-04 DIAGNOSIS — F329 Major depressive disorder, single episode, unspecified: Secondary | ICD-10-CM | POA: Diagnosis not present

## 2012-06-04 DIAGNOSIS — G4730 Sleep apnea, unspecified: Secondary | ICD-10-CM | POA: Diagnosis not present

## 2012-06-04 DIAGNOSIS — I10 Essential (primary) hypertension: Secondary | ICD-10-CM | POA: Diagnosis not present

## 2012-06-04 DIAGNOSIS — E119 Type 2 diabetes mellitus without complications: Secondary | ICD-10-CM | POA: Diagnosis not present

## 2012-06-04 DIAGNOSIS — N390 Urinary tract infection, site not specified: Secondary | ICD-10-CM | POA: Diagnosis not present

## 2012-06-05 DIAGNOSIS — G4710 Hypersomnia, unspecified: Secondary | ICD-10-CM | POA: Diagnosis not present

## 2012-06-05 DIAGNOSIS — G4730 Sleep apnea, unspecified: Secondary | ICD-10-CM | POA: Diagnosis not present

## 2012-06-11 DIAGNOSIS — I10 Essential (primary) hypertension: Secondary | ICD-10-CM | POA: Diagnosis not present

## 2012-06-11 DIAGNOSIS — G4730 Sleep apnea, unspecified: Secondary | ICD-10-CM | POA: Diagnosis not present

## 2012-06-11 DIAGNOSIS — I498 Other specified cardiac arrhythmias: Secondary | ICD-10-CM | POA: Diagnosis not present

## 2012-06-11 DIAGNOSIS — E119 Type 2 diabetes mellitus without complications: Secondary | ICD-10-CM | POA: Diagnosis not present

## 2012-06-11 DIAGNOSIS — F329 Major depressive disorder, single episode, unspecified: Secondary | ICD-10-CM | POA: Diagnosis not present

## 2012-06-13 DIAGNOSIS — G4710 Hypersomnia, unspecified: Secondary | ICD-10-CM | POA: Diagnosis not present

## 2012-06-13 DIAGNOSIS — G4730 Sleep apnea, unspecified: Secondary | ICD-10-CM | POA: Diagnosis not present

## 2012-06-18 DIAGNOSIS — N390 Urinary tract infection, site not specified: Secondary | ICD-10-CM | POA: Diagnosis not present

## 2012-06-18 DIAGNOSIS — R300 Dysuria: Secondary | ICD-10-CM | POA: Diagnosis not present

## 2012-07-01 DIAGNOSIS — G4710 Hypersomnia, unspecified: Secondary | ICD-10-CM | POA: Diagnosis not present

## 2012-07-01 DIAGNOSIS — G4730 Sleep apnea, unspecified: Secondary | ICD-10-CM | POA: Diagnosis not present

## 2012-07-15 DIAGNOSIS — G4710 Hypersomnia, unspecified: Secondary | ICD-10-CM | POA: Diagnosis not present

## 2012-07-31 DIAGNOSIS — D539 Nutritional anemia, unspecified: Secondary | ICD-10-CM | POA: Diagnosis not present

## 2012-07-31 DIAGNOSIS — Z6841 Body Mass Index (BMI) 40.0 and over, adult: Secondary | ICD-10-CM | POA: Diagnosis not present

## 2012-07-31 DIAGNOSIS — E785 Hyperlipidemia, unspecified: Secondary | ICD-10-CM | POA: Diagnosis not present

## 2012-07-31 DIAGNOSIS — I10 Essential (primary) hypertension: Secondary | ICD-10-CM | POA: Diagnosis not present

## 2012-07-31 DIAGNOSIS — E119 Type 2 diabetes mellitus without complications: Secondary | ICD-10-CM | POA: Diagnosis not present

## 2012-08-12 DIAGNOSIS — R0989 Other specified symptoms and signs involving the circulatory and respiratory systems: Secondary | ICD-10-CM | POA: Diagnosis not present

## 2012-08-12 DIAGNOSIS — G4710 Hypersomnia, unspecified: Secondary | ICD-10-CM | POA: Diagnosis not present

## 2012-08-12 DIAGNOSIS — R5383 Other fatigue: Secondary | ICD-10-CM | POA: Diagnosis not present

## 2012-08-12 DIAGNOSIS — G4730 Sleep apnea, unspecified: Secondary | ICD-10-CM | POA: Diagnosis not present

## 2012-08-12 DIAGNOSIS — R5381 Other malaise: Secondary | ICD-10-CM | POA: Diagnosis not present

## 2012-08-12 DIAGNOSIS — E559 Vitamin D deficiency, unspecified: Secondary | ICD-10-CM | POA: Diagnosis not present

## 2012-08-12 DIAGNOSIS — R0609 Other forms of dyspnea: Secondary | ICD-10-CM | POA: Diagnosis not present

## 2012-08-13 DIAGNOSIS — G4730 Sleep apnea, unspecified: Secondary | ICD-10-CM | POA: Diagnosis not present

## 2012-08-18 DIAGNOSIS — I472 Ventricular tachycardia, unspecified: Secondary | ICD-10-CM | POA: Diagnosis not present

## 2012-08-18 DIAGNOSIS — E119 Type 2 diabetes mellitus without complications: Secondary | ICD-10-CM | POA: Diagnosis not present

## 2012-08-18 DIAGNOSIS — R079 Chest pain, unspecified: Secondary | ICD-10-CM | POA: Diagnosis not present

## 2012-08-18 DIAGNOSIS — E039 Hypothyroidism, unspecified: Secondary | ICD-10-CM | POA: Diagnosis not present

## 2012-08-18 DIAGNOSIS — I10 Essential (primary) hypertension: Secondary | ICD-10-CM | POA: Diagnosis not present

## 2012-08-19 DIAGNOSIS — R0609 Other forms of dyspnea: Secondary | ICD-10-CM | POA: Diagnosis not present

## 2012-08-19 DIAGNOSIS — G4730 Sleep apnea, unspecified: Secondary | ICD-10-CM | POA: Diagnosis not present

## 2012-08-19 DIAGNOSIS — G4710 Hypersomnia, unspecified: Secondary | ICD-10-CM | POA: Diagnosis not present

## 2012-08-19 DIAGNOSIS — R5381 Other malaise: Secondary | ICD-10-CM | POA: Diagnosis not present

## 2012-08-20 DIAGNOSIS — G4710 Hypersomnia, unspecified: Secondary | ICD-10-CM | POA: Diagnosis not present

## 2012-08-28 DIAGNOSIS — N390 Urinary tract infection, site not specified: Secondary | ICD-10-CM | POA: Diagnosis not present

## 2012-08-28 DIAGNOSIS — J019 Acute sinusitis, unspecified: Secondary | ICD-10-CM | POA: Diagnosis not present

## 2012-08-28 DIAGNOSIS — J309 Allergic rhinitis, unspecified: Secondary | ICD-10-CM | POA: Diagnosis not present

## 2012-08-28 DIAGNOSIS — Z6841 Body Mass Index (BMI) 40.0 and over, adult: Secondary | ICD-10-CM | POA: Diagnosis not present

## 2012-10-07 DIAGNOSIS — R079 Chest pain, unspecified: Secondary | ICD-10-CM | POA: Diagnosis not present

## 2012-10-07 DIAGNOSIS — E119 Type 2 diabetes mellitus without complications: Secondary | ICD-10-CM | POA: Diagnosis not present

## 2012-10-07 DIAGNOSIS — E039 Hypothyroidism, unspecified: Secondary | ICD-10-CM | POA: Diagnosis not present

## 2012-10-07 DIAGNOSIS — R52 Pain, unspecified: Secondary | ICD-10-CM | POA: Diagnosis not present

## 2012-10-07 DIAGNOSIS — I10 Essential (primary) hypertension: Secondary | ICD-10-CM | POA: Diagnosis not present

## 2012-10-07 DIAGNOSIS — Z79899 Other long term (current) drug therapy: Secondary | ICD-10-CM | POA: Diagnosis not present

## 2012-10-28 DIAGNOSIS — R634 Abnormal weight loss: Secondary | ICD-10-CM | POA: Diagnosis not present

## 2012-10-28 DIAGNOSIS — R197 Diarrhea, unspecified: Secondary | ICD-10-CM | POA: Diagnosis not present

## 2012-10-29 DIAGNOSIS — R197 Diarrhea, unspecified: Secondary | ICD-10-CM | POA: Diagnosis not present

## 2012-11-04 DIAGNOSIS — E785 Hyperlipidemia, unspecified: Secondary | ICD-10-CM | POA: Diagnosis not present

## 2012-11-04 DIAGNOSIS — D539 Nutritional anemia, unspecified: Secondary | ICD-10-CM | POA: Diagnosis not present

## 2012-11-04 DIAGNOSIS — E119 Type 2 diabetes mellitus without complications: Secondary | ICD-10-CM | POA: Diagnosis not present

## 2012-11-04 DIAGNOSIS — Z6841 Body Mass Index (BMI) 40.0 and over, adult: Secondary | ICD-10-CM | POA: Diagnosis not present

## 2012-11-12 IMAGING — CR DG CHEST 1V
1 series · 1 of 1 positions shown · non-contrast
Comparison: None.

CLINICAL DATA: Preop for humerus fracture.

CHEST - 1 VIEW

[view not recorded]
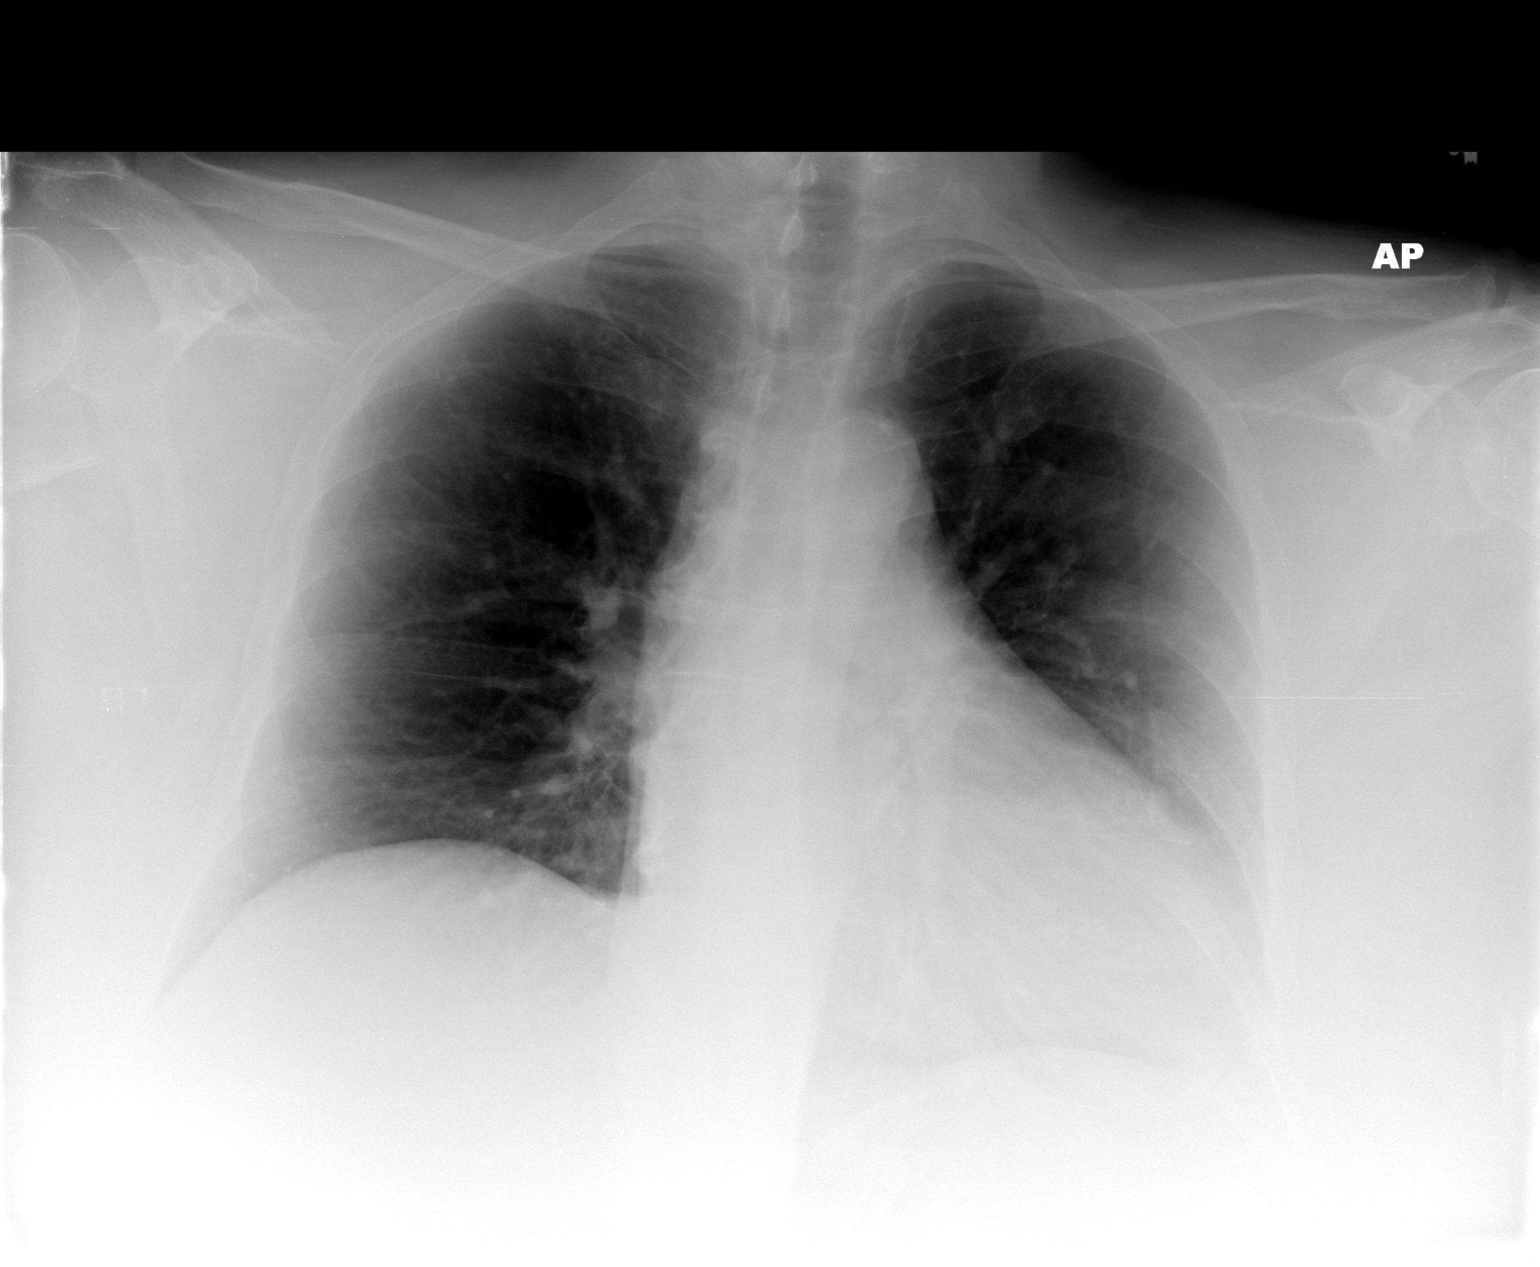

[1 of 1 positions shown; findings below may reference images not displayed]

FINDINGS: Heart size top normal to minimally enlarged.

Mild central pulmonary vascular prominence.

Calcified aorta.

No segmental infiltrate, pulmonary edema or gross pneumothorax.

Limited evaluation right lung base.
IMPRESSION: Heart size top normal to minimally enlarged.

Mild central pulmonary vascular prominence.

Calcified aorta.

No segmental infiltrate, pulmonary edema or gross pneumothorax.

Limited evaluation right lung base.

## 2012-11-13 IMAGING — RF DG HUMERUS 2V *R*
1 series · 3 of 3 positions shown · non-contrast
Comparison: None.

CLINICAL DATA: Operative fixation of a right humerus fracture.

RIGHT HUMERUS - 2+ VIEW

[Series 1: run · 3 of 3 slices shown]
[im 1/3]
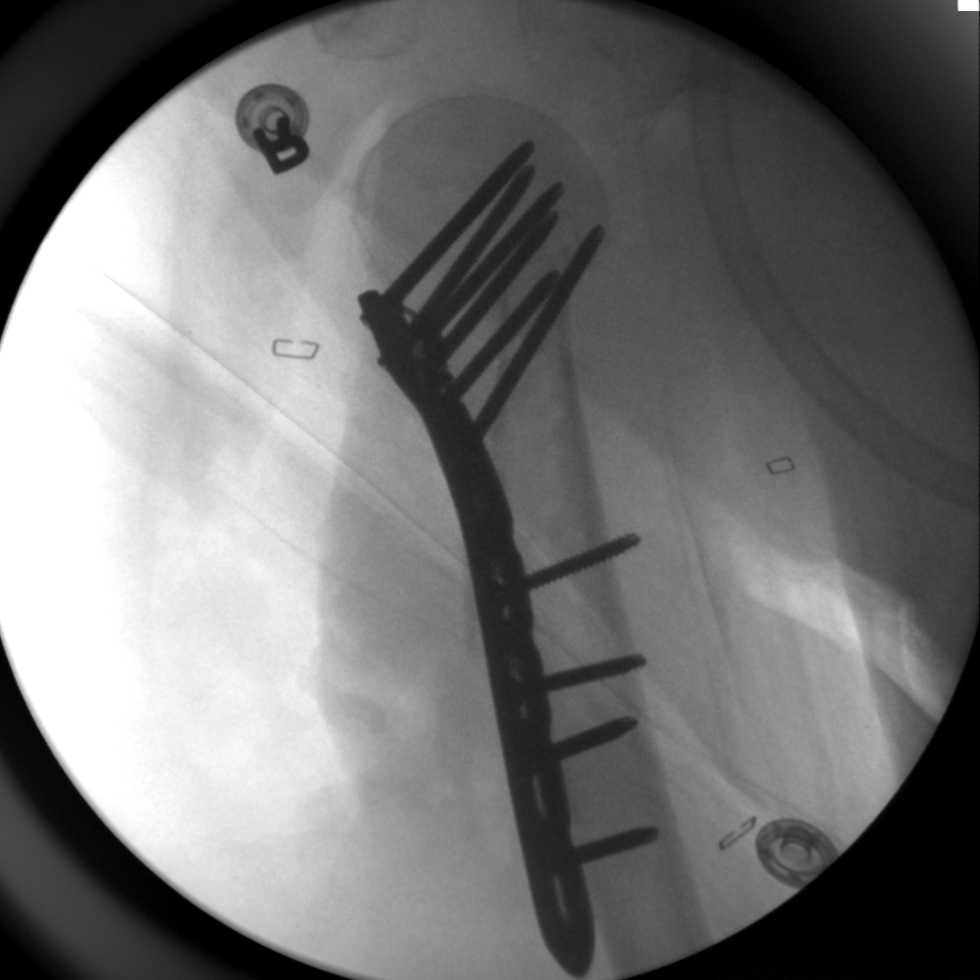
[im 2/3]
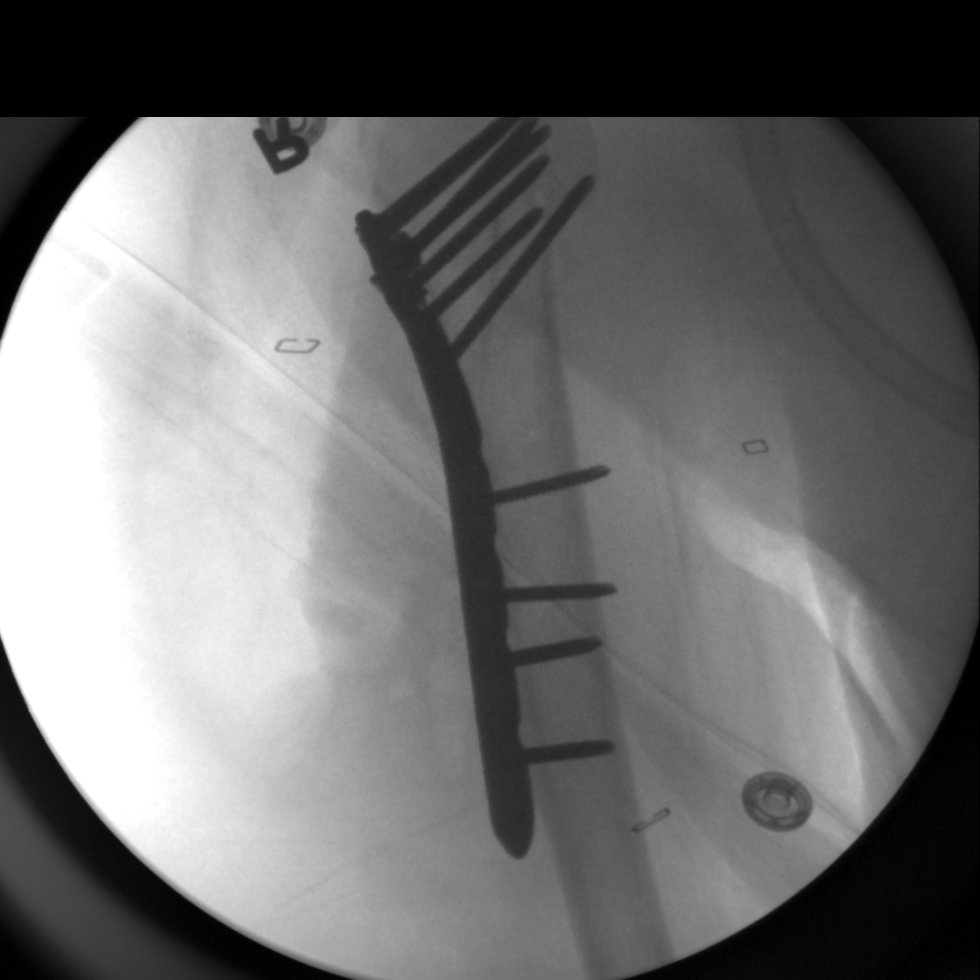
[im 3/3]
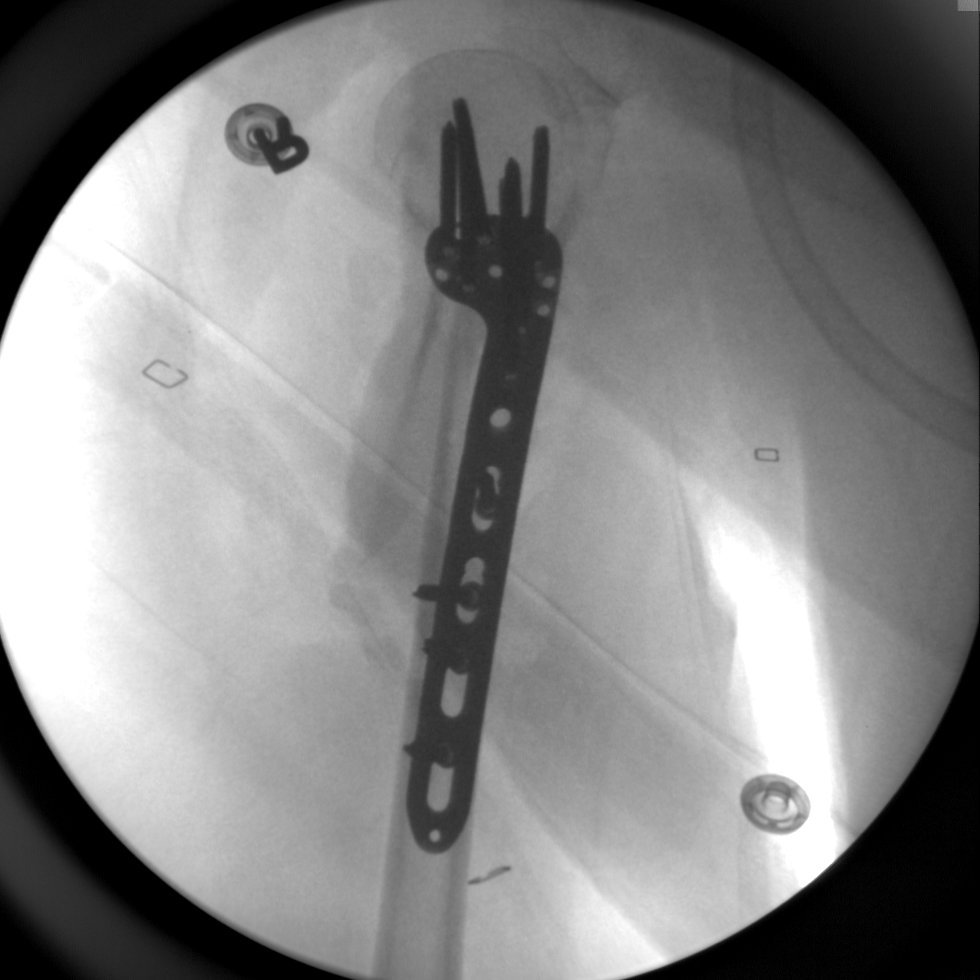

[3 of 3 positions shown; findings below may reference images not displayed]

FINDINGS: Three C-arm views of the right humerus demonstrate screw
and plate fixation of a suboptimally visualized oblique fracture of
the proximal humeral shaft and metadiaphysis region.  On one of the
views, there is a suggestion of displacement and angulation of the
distal portion of the proximal fragment.  The exact orientation
cannot be determined on these views.
IMPRESSION: Hardware fixation of a proximal humerus fracture, as described
above.  Routine views are recommended when clinically feasible.

## 2012-11-13 IMAGING — CR DG SHOULDER 2+V PORT*R*
1 series · 1 of 1 positions shown · non-contrast
Comparison: 10/31/2010

CLINICAL DATA: Status post right shoulder ORIF

PORTABLE RIGHT SHOULDER - 2+ VIEW

[AP]
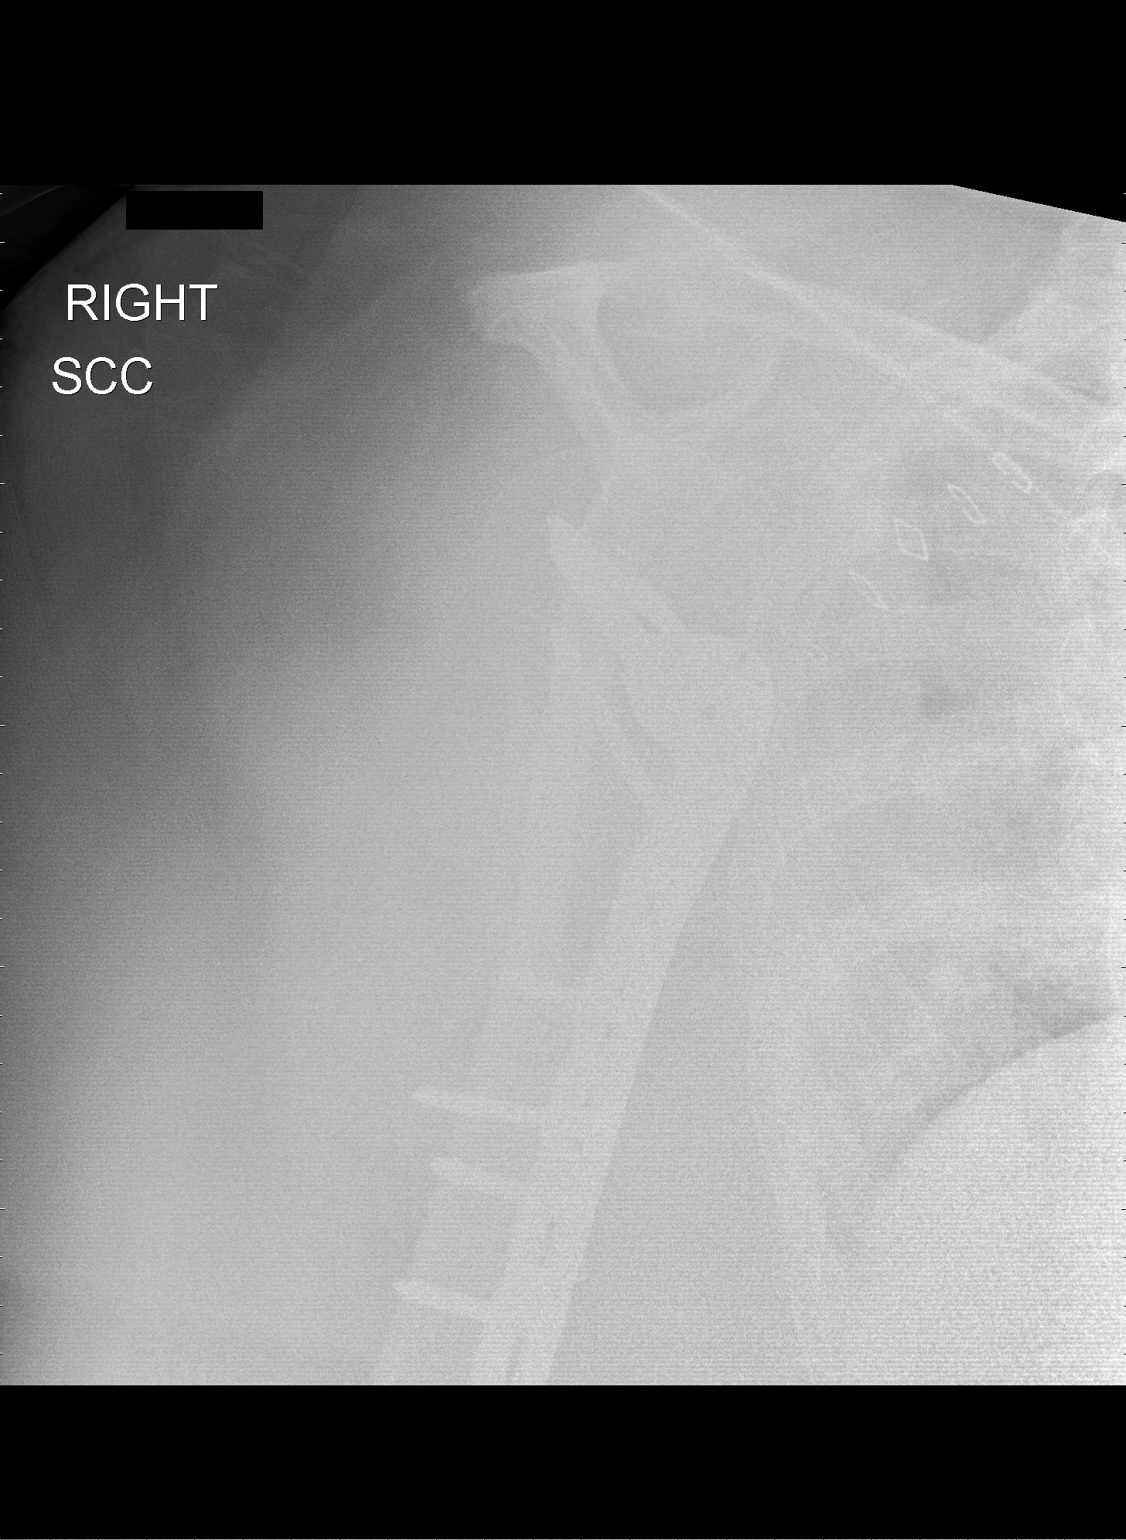

[1 of 1 positions shown; findings below may reference images not displayed]

FINDINGS: Single portable radiograph of the right shoulder is
obtained in the trans scapular Y orientation.  A side plate and
screw device reduces the proximal humeral fracture.  No obvious
complicating features identified.
IMPRESSION: 1.  Status post proximal humerus ORIF.

## 2012-11-17 DIAGNOSIS — N3090 Cystitis, unspecified without hematuria: Secondary | ICD-10-CM | POA: Diagnosis not present

## 2012-11-17 DIAGNOSIS — N3289 Other specified disorders of bladder: Secondary | ICD-10-CM | POA: Diagnosis not present

## 2012-11-24 DIAGNOSIS — R110 Nausea: Secondary | ICD-10-CM | POA: Diagnosis not present

## 2012-11-25 DIAGNOSIS — Z79899 Other long term (current) drug therapy: Secondary | ICD-10-CM | POA: Diagnosis not present

## 2012-11-25 DIAGNOSIS — R079 Chest pain, unspecified: Secondary | ICD-10-CM | POA: Diagnosis not present

## 2012-11-25 DIAGNOSIS — I10 Essential (primary) hypertension: Secondary | ICD-10-CM | POA: Diagnosis not present

## 2012-11-25 DIAGNOSIS — S298XXA Other specified injuries of thorax, initial encounter: Secondary | ICD-10-CM | POA: Diagnosis not present

## 2012-11-25 DIAGNOSIS — M546 Pain in thoracic spine: Secondary | ICD-10-CM | POA: Diagnosis not present

## 2012-11-25 DIAGNOSIS — W010XXA Fall on same level from slipping, tripping and stumbling without subsequent striking against object, initial encounter: Secondary | ICD-10-CM | POA: Diagnosis not present

## 2012-11-25 DIAGNOSIS — S52599A Other fractures of lower end of unspecified radius, initial encounter for closed fracture: Secondary | ICD-10-CM | POA: Diagnosis not present

## 2012-11-25 DIAGNOSIS — S5290XA Unspecified fracture of unspecified forearm, initial encounter for closed fracture: Secondary | ICD-10-CM | POA: Diagnosis not present

## 2012-11-25 DIAGNOSIS — T148XXA Other injury of unspecified body region, initial encounter: Secondary | ICD-10-CM | POA: Diagnosis not present

## 2012-11-25 DIAGNOSIS — K219 Gastro-esophageal reflux disease without esophagitis: Secondary | ICD-10-CM | POA: Diagnosis not present

## 2012-11-25 DIAGNOSIS — IMO0002 Reserved for concepts with insufficient information to code with codable children: Secondary | ICD-10-CM | POA: Diagnosis not present

## 2012-11-25 DIAGNOSIS — M545 Low back pain: Secondary | ICD-10-CM | POA: Diagnosis not present

## 2012-11-25 DIAGNOSIS — E119 Type 2 diabetes mellitus without complications: Secondary | ICD-10-CM | POA: Diagnosis not present

## 2012-11-25 DIAGNOSIS — E7800 Pure hypercholesterolemia, unspecified: Secondary | ICD-10-CM | POA: Diagnosis not present

## 2012-11-25 DIAGNOSIS — S4980XA Other specified injuries of shoulder and upper arm, unspecified arm, initial encounter: Secondary | ICD-10-CM | POA: Diagnosis not present

## 2012-11-25 DIAGNOSIS — M25519 Pain in unspecified shoulder: Secondary | ICD-10-CM | POA: Diagnosis not present

## 2012-12-03 DIAGNOSIS — S52599A Other fractures of lower end of unspecified radius, initial encounter for closed fracture: Secondary | ICD-10-CM | POA: Diagnosis not present

## 2012-12-10 DIAGNOSIS — S52599A Other fractures of lower end of unspecified radius, initial encounter for closed fracture: Secondary | ICD-10-CM | POA: Diagnosis not present

## 2012-12-31 DIAGNOSIS — I10 Essential (primary) hypertension: Secondary | ICD-10-CM | POA: Diagnosis not present

## 2012-12-31 DIAGNOSIS — E119 Type 2 diabetes mellitus without complications: Secondary | ICD-10-CM | POA: Diagnosis not present

## 2012-12-31 DIAGNOSIS — E039 Hypothyroidism, unspecified: Secondary | ICD-10-CM | POA: Diagnosis not present

## 2012-12-31 DIAGNOSIS — R079 Chest pain, unspecified: Secondary | ICD-10-CM | POA: Diagnosis not present

## 2013-01-16 DIAGNOSIS — S52599A Other fractures of lower end of unspecified radius, initial encounter for closed fracture: Secondary | ICD-10-CM | POA: Diagnosis not present

## 2013-01-20 DIAGNOSIS — N390 Urinary tract infection, site not specified: Secondary | ICD-10-CM | POA: Diagnosis not present

## 2013-01-20 DIAGNOSIS — Z6841 Body Mass Index (BMI) 40.0 and over, adult: Secondary | ICD-10-CM | POA: Diagnosis not present

## 2013-01-20 DIAGNOSIS — J019 Acute sinusitis, unspecified: Secondary | ICD-10-CM | POA: Diagnosis not present

## 2013-01-20 DIAGNOSIS — R21 Rash and other nonspecific skin eruption: Secondary | ICD-10-CM | POA: Diagnosis not present

## 2013-01-22 DIAGNOSIS — F3289 Other specified depressive episodes: Secondary | ICD-10-CM | POA: Diagnosis not present

## 2013-01-22 DIAGNOSIS — E119 Type 2 diabetes mellitus without complications: Secondary | ICD-10-CM | POA: Diagnosis not present

## 2013-01-22 DIAGNOSIS — N390 Urinary tract infection, site not specified: Secondary | ICD-10-CM | POA: Diagnosis not present

## 2013-01-22 DIAGNOSIS — R262 Difficulty in walking, not elsewhere classified: Secondary | ICD-10-CM | POA: Diagnosis not present

## 2013-01-22 DIAGNOSIS — S5290XD Unspecified fracture of unspecified forearm, subsequent encounter for closed fracture with routine healing: Secondary | ICD-10-CM | POA: Diagnosis not present

## 2013-01-22 DIAGNOSIS — Z5189 Encounter for other specified aftercare: Secondary | ICD-10-CM | POA: Diagnosis not present

## 2013-02-04 DIAGNOSIS — I472 Ventricular tachycardia: Secondary | ICD-10-CM | POA: Diagnosis not present

## 2013-02-04 DIAGNOSIS — I10 Essential (primary) hypertension: Secondary | ICD-10-CM | POA: Diagnosis not present

## 2013-02-04 DIAGNOSIS — Z79899 Other long term (current) drug therapy: Secondary | ICD-10-CM | POA: Diagnosis not present

## 2013-02-04 DIAGNOSIS — I4729 Other ventricular tachycardia: Secondary | ICD-10-CM | POA: Diagnosis not present

## 2013-02-04 DIAGNOSIS — E119 Type 2 diabetes mellitus without complications: Secondary | ICD-10-CM | POA: Diagnosis not present

## 2013-02-10 DIAGNOSIS — E119 Type 2 diabetes mellitus without complications: Secondary | ICD-10-CM | POA: Diagnosis not present

## 2013-02-10 DIAGNOSIS — Z6841 Body Mass Index (BMI) 40.0 and over, adult: Secondary | ICD-10-CM | POA: Diagnosis not present

## 2013-02-10 DIAGNOSIS — E785 Hyperlipidemia, unspecified: Secondary | ICD-10-CM | POA: Diagnosis not present

## 2013-02-10 DIAGNOSIS — I10 Essential (primary) hypertension: Secondary | ICD-10-CM | POA: Diagnosis not present

## 2013-02-10 DIAGNOSIS — D539 Nutritional anemia, unspecified: Secondary | ICD-10-CM | POA: Diagnosis not present

## 2013-02-10 DIAGNOSIS — R300 Dysuria: Secondary | ICD-10-CM | POA: Diagnosis not present

## 2013-03-04 DIAGNOSIS — N390 Urinary tract infection, site not specified: Secondary | ICD-10-CM | POA: Diagnosis not present

## 2013-03-06 DIAGNOSIS — S5290XD Unspecified fracture of unspecified forearm, subsequent encounter for closed fracture with routine healing: Secondary | ICD-10-CM | POA: Diagnosis not present

## 2013-03-06 DIAGNOSIS — S52599A Other fractures of lower end of unspecified radius, initial encounter for closed fracture: Secondary | ICD-10-CM | POA: Diagnosis not present

## 2013-03-11 DIAGNOSIS — I10 Essential (primary) hypertension: Secondary | ICD-10-CM | POA: Diagnosis not present

## 2013-03-11 DIAGNOSIS — Z79899 Other long term (current) drug therapy: Secondary | ICD-10-CM | POA: Diagnosis not present

## 2013-03-11 DIAGNOSIS — R079 Chest pain, unspecified: Secondary | ICD-10-CM | POA: Diagnosis not present

## 2013-03-11 DIAGNOSIS — I4729 Other ventricular tachycardia: Secondary | ICD-10-CM | POA: Diagnosis not present

## 2013-03-11 DIAGNOSIS — I472 Ventricular tachycardia: Secondary | ICD-10-CM | POA: Diagnosis not present

## 2013-03-11 DIAGNOSIS — E119 Type 2 diabetes mellitus without complications: Secondary | ICD-10-CM | POA: Diagnosis not present

## 2013-03-23 DIAGNOSIS — E119 Type 2 diabetes mellitus without complications: Secondary | ICD-10-CM | POA: Diagnosis not present

## 2013-03-23 DIAGNOSIS — F3289 Other specified depressive episodes: Secondary | ICD-10-CM | POA: Diagnosis not present

## 2013-03-23 DIAGNOSIS — Z5189 Encounter for other specified aftercare: Secondary | ICD-10-CM | POA: Diagnosis not present

## 2013-03-23 DIAGNOSIS — F329 Major depressive disorder, single episode, unspecified: Secondary | ICD-10-CM | POA: Diagnosis not present

## 2013-03-23 DIAGNOSIS — R262 Difficulty in walking, not elsewhere classified: Secondary | ICD-10-CM | POA: Diagnosis not present

## 2013-03-23 DIAGNOSIS — S5290XD Unspecified fracture of unspecified forearm, subsequent encounter for closed fracture with routine healing: Secondary | ICD-10-CM | POA: Diagnosis not present

## 2013-03-23 DIAGNOSIS — N390 Urinary tract infection, site not specified: Secondary | ICD-10-CM | POA: Diagnosis not present

## 2013-03-25 DIAGNOSIS — S52599A Other fractures of lower end of unspecified radius, initial encounter for closed fracture: Secondary | ICD-10-CM | POA: Diagnosis not present

## 2013-03-25 DIAGNOSIS — Z5189 Encounter for other specified aftercare: Secondary | ICD-10-CM | POA: Diagnosis not present

## 2013-03-25 DIAGNOSIS — N390 Urinary tract infection, site not specified: Secondary | ICD-10-CM | POA: Diagnosis not present

## 2013-03-25 DIAGNOSIS — M25519 Pain in unspecified shoulder: Secondary | ICD-10-CM | POA: Diagnosis not present

## 2013-03-25 DIAGNOSIS — F3289 Other specified depressive episodes: Secondary | ICD-10-CM | POA: Diagnosis not present

## 2013-03-25 DIAGNOSIS — S5290XD Unspecified fracture of unspecified forearm, subsequent encounter for closed fracture with routine healing: Secondary | ICD-10-CM | POA: Diagnosis not present

## 2013-03-25 DIAGNOSIS — E119 Type 2 diabetes mellitus without complications: Secondary | ICD-10-CM | POA: Diagnosis not present

## 2013-03-25 DIAGNOSIS — F329 Major depressive disorder, single episode, unspecified: Secondary | ICD-10-CM | POA: Diagnosis not present

## 2013-03-25 DIAGNOSIS — R262 Difficulty in walking, not elsewhere classified: Secondary | ICD-10-CM | POA: Diagnosis not present

## 2013-03-26 DIAGNOSIS — E119 Type 2 diabetes mellitus without complications: Secondary | ICD-10-CM | POA: Diagnosis not present

## 2013-03-26 DIAGNOSIS — F329 Major depressive disorder, single episode, unspecified: Secondary | ICD-10-CM | POA: Diagnosis not present

## 2013-03-26 DIAGNOSIS — S5290XD Unspecified fracture of unspecified forearm, subsequent encounter for closed fracture with routine healing: Secondary | ICD-10-CM | POA: Diagnosis not present

## 2013-03-26 DIAGNOSIS — N390 Urinary tract infection, site not specified: Secondary | ICD-10-CM | POA: Diagnosis not present

## 2013-03-26 DIAGNOSIS — F3289 Other specified depressive episodes: Secondary | ICD-10-CM | POA: Diagnosis not present

## 2013-03-26 DIAGNOSIS — R262 Difficulty in walking, not elsewhere classified: Secondary | ICD-10-CM | POA: Diagnosis not present

## 2013-03-26 DIAGNOSIS — Z5189 Encounter for other specified aftercare: Secondary | ICD-10-CM | POA: Diagnosis not present

## 2013-03-30 DIAGNOSIS — Z5189 Encounter for other specified aftercare: Secondary | ICD-10-CM | POA: Diagnosis not present

## 2013-03-30 DIAGNOSIS — E119 Type 2 diabetes mellitus without complications: Secondary | ICD-10-CM | POA: Diagnosis not present

## 2013-03-30 DIAGNOSIS — F329 Major depressive disorder, single episode, unspecified: Secondary | ICD-10-CM | POA: Diagnosis not present

## 2013-03-30 DIAGNOSIS — S5290XD Unspecified fracture of unspecified forearm, subsequent encounter for closed fracture with routine healing: Secondary | ICD-10-CM | POA: Diagnosis not present

## 2013-03-30 DIAGNOSIS — R262 Difficulty in walking, not elsewhere classified: Secondary | ICD-10-CM | POA: Diagnosis not present

## 2013-03-30 DIAGNOSIS — N390 Urinary tract infection, site not specified: Secondary | ICD-10-CM | POA: Diagnosis not present

## 2013-03-30 DIAGNOSIS — F3289 Other specified depressive episodes: Secondary | ICD-10-CM | POA: Diagnosis not present

## 2013-04-01 DIAGNOSIS — E119 Type 2 diabetes mellitus without complications: Secondary | ICD-10-CM | POA: Diagnosis not present

## 2013-04-01 DIAGNOSIS — Z5189 Encounter for other specified aftercare: Secondary | ICD-10-CM | POA: Diagnosis not present

## 2013-04-01 DIAGNOSIS — F329 Major depressive disorder, single episode, unspecified: Secondary | ICD-10-CM | POA: Diagnosis not present

## 2013-04-01 DIAGNOSIS — S5290XD Unspecified fracture of unspecified forearm, subsequent encounter for closed fracture with routine healing: Secondary | ICD-10-CM | POA: Diagnosis not present

## 2013-04-01 DIAGNOSIS — N390 Urinary tract infection, site not specified: Secondary | ICD-10-CM | POA: Diagnosis not present

## 2013-04-01 DIAGNOSIS — F3289 Other specified depressive episodes: Secondary | ICD-10-CM | POA: Diagnosis not present

## 2013-04-01 DIAGNOSIS — R262 Difficulty in walking, not elsewhere classified: Secondary | ICD-10-CM | POA: Diagnosis not present

## 2013-04-02 DIAGNOSIS — N390 Urinary tract infection, site not specified: Secondary | ICD-10-CM | POA: Diagnosis not present

## 2013-04-02 DIAGNOSIS — Z5189 Encounter for other specified aftercare: Secondary | ICD-10-CM | POA: Diagnosis not present

## 2013-04-02 DIAGNOSIS — E119 Type 2 diabetes mellitus without complications: Secondary | ICD-10-CM | POA: Diagnosis not present

## 2013-04-02 DIAGNOSIS — F3289 Other specified depressive episodes: Secondary | ICD-10-CM | POA: Diagnosis not present

## 2013-04-02 DIAGNOSIS — R262 Difficulty in walking, not elsewhere classified: Secondary | ICD-10-CM | POA: Diagnosis not present

## 2013-04-02 DIAGNOSIS — S5290XD Unspecified fracture of unspecified forearm, subsequent encounter for closed fracture with routine healing: Secondary | ICD-10-CM | POA: Diagnosis not present

## 2013-04-02 DIAGNOSIS — F329 Major depressive disorder, single episode, unspecified: Secondary | ICD-10-CM | POA: Diagnosis not present

## 2013-04-06 DIAGNOSIS — E119 Type 2 diabetes mellitus without complications: Secondary | ICD-10-CM | POA: Diagnosis not present

## 2013-04-06 DIAGNOSIS — Z5189 Encounter for other specified aftercare: Secondary | ICD-10-CM | POA: Diagnosis not present

## 2013-04-06 DIAGNOSIS — F3289 Other specified depressive episodes: Secondary | ICD-10-CM | POA: Diagnosis not present

## 2013-04-06 DIAGNOSIS — N390 Urinary tract infection, site not specified: Secondary | ICD-10-CM | POA: Diagnosis not present

## 2013-04-06 DIAGNOSIS — F329 Major depressive disorder, single episode, unspecified: Secondary | ICD-10-CM | POA: Diagnosis not present

## 2013-04-06 DIAGNOSIS — S5290XD Unspecified fracture of unspecified forearm, subsequent encounter for closed fracture with routine healing: Secondary | ICD-10-CM | POA: Diagnosis not present

## 2013-04-06 DIAGNOSIS — R262 Difficulty in walking, not elsewhere classified: Secondary | ICD-10-CM | POA: Diagnosis not present

## 2013-04-08 DIAGNOSIS — N390 Urinary tract infection, site not specified: Secondary | ICD-10-CM | POA: Diagnosis not present

## 2013-04-08 DIAGNOSIS — F329 Major depressive disorder, single episode, unspecified: Secondary | ICD-10-CM | POA: Diagnosis not present

## 2013-04-08 DIAGNOSIS — E119 Type 2 diabetes mellitus without complications: Secondary | ICD-10-CM | POA: Diagnosis not present

## 2013-04-08 DIAGNOSIS — Z5189 Encounter for other specified aftercare: Secondary | ICD-10-CM | POA: Diagnosis not present

## 2013-04-08 DIAGNOSIS — R262 Difficulty in walking, not elsewhere classified: Secondary | ICD-10-CM | POA: Diagnosis not present

## 2013-04-08 DIAGNOSIS — S5290XD Unspecified fracture of unspecified forearm, subsequent encounter for closed fracture with routine healing: Secondary | ICD-10-CM | POA: Diagnosis not present

## 2013-04-08 DIAGNOSIS — F3289 Other specified depressive episodes: Secondary | ICD-10-CM | POA: Diagnosis not present

## 2013-04-13 DIAGNOSIS — R079 Chest pain, unspecified: Secondary | ICD-10-CM | POA: Diagnosis not present

## 2013-04-13 DIAGNOSIS — E039 Hypothyroidism, unspecified: Secondary | ICD-10-CM | POA: Diagnosis not present

## 2013-04-13 DIAGNOSIS — E119 Type 2 diabetes mellitus without complications: Secondary | ICD-10-CM | POA: Diagnosis not present

## 2013-04-13 DIAGNOSIS — I10 Essential (primary) hypertension: Secondary | ICD-10-CM | POA: Diagnosis not present

## 2013-04-14 DIAGNOSIS — F3289 Other specified depressive episodes: Secondary | ICD-10-CM | POA: Diagnosis not present

## 2013-04-14 DIAGNOSIS — F329 Major depressive disorder, single episode, unspecified: Secondary | ICD-10-CM | POA: Diagnosis not present

## 2013-04-14 DIAGNOSIS — R262 Difficulty in walking, not elsewhere classified: Secondary | ICD-10-CM | POA: Diagnosis not present

## 2013-04-14 DIAGNOSIS — Z5189 Encounter for other specified aftercare: Secondary | ICD-10-CM | POA: Diagnosis not present

## 2013-04-14 DIAGNOSIS — N390 Urinary tract infection, site not specified: Secondary | ICD-10-CM | POA: Diagnosis not present

## 2013-04-14 DIAGNOSIS — E119 Type 2 diabetes mellitus without complications: Secondary | ICD-10-CM | POA: Diagnosis not present

## 2013-04-14 DIAGNOSIS — S5290XD Unspecified fracture of unspecified forearm, subsequent encounter for closed fracture with routine healing: Secondary | ICD-10-CM | POA: Diagnosis not present

## 2013-04-16 DIAGNOSIS — R262 Difficulty in walking, not elsewhere classified: Secondary | ICD-10-CM | POA: Diagnosis not present

## 2013-04-16 DIAGNOSIS — Z5189 Encounter for other specified aftercare: Secondary | ICD-10-CM | POA: Diagnosis not present

## 2013-04-16 DIAGNOSIS — E119 Type 2 diabetes mellitus without complications: Secondary | ICD-10-CM | POA: Diagnosis not present

## 2013-04-16 DIAGNOSIS — S5290XD Unspecified fracture of unspecified forearm, subsequent encounter for closed fracture with routine healing: Secondary | ICD-10-CM | POA: Diagnosis not present

## 2013-04-16 DIAGNOSIS — F329 Major depressive disorder, single episode, unspecified: Secondary | ICD-10-CM | POA: Diagnosis not present

## 2013-04-16 DIAGNOSIS — N390 Urinary tract infection, site not specified: Secondary | ICD-10-CM | POA: Diagnosis not present

## 2013-04-16 DIAGNOSIS — F3289 Other specified depressive episodes: Secondary | ICD-10-CM | POA: Diagnosis not present

## 2013-05-09 DIAGNOSIS — N390 Urinary tract infection, site not specified: Secondary | ICD-10-CM | POA: Diagnosis not present

## 2013-05-09 DIAGNOSIS — E119 Type 2 diabetes mellitus without complications: Secondary | ICD-10-CM | POA: Diagnosis not present

## 2013-05-09 DIAGNOSIS — N3000 Acute cystitis without hematuria: Secondary | ICD-10-CM | POA: Diagnosis not present

## 2013-05-13 DIAGNOSIS — E119 Type 2 diabetes mellitus without complications: Secondary | ICD-10-CM | POA: Diagnosis not present

## 2013-05-13 DIAGNOSIS — I10 Essential (primary) hypertension: Secondary | ICD-10-CM | POA: Diagnosis not present

## 2013-05-13 DIAGNOSIS — N390 Urinary tract infection, site not specified: Secondary | ICD-10-CM | POA: Diagnosis not present

## 2013-05-13 DIAGNOSIS — Z6841 Body Mass Index (BMI) 40.0 and over, adult: Secondary | ICD-10-CM | POA: Diagnosis not present

## 2013-05-13 DIAGNOSIS — D539 Nutritional anemia, unspecified: Secondary | ICD-10-CM | POA: Diagnosis not present

## 2013-05-13 DIAGNOSIS — E785 Hyperlipidemia, unspecified: Secondary | ICD-10-CM | POA: Diagnosis not present

## 2013-07-14 DIAGNOSIS — R310 Gross hematuria: Secondary | ICD-10-CM | POA: Diagnosis not present

## 2013-07-14 DIAGNOSIS — N3289 Other specified disorders of bladder: Secondary | ICD-10-CM | POA: Diagnosis not present

## 2013-07-14 DIAGNOSIS — N3090 Cystitis, unspecified without hematuria: Secondary | ICD-10-CM | POA: Diagnosis not present

## 2013-07-20 DIAGNOSIS — R310 Gross hematuria: Secondary | ICD-10-CM | POA: Diagnosis not present

## 2013-07-20 DIAGNOSIS — R160 Hepatomegaly, not elsewhere classified: Secondary | ICD-10-CM | POA: Diagnosis not present

## 2013-07-20 DIAGNOSIS — R319 Hematuria, unspecified: Secondary | ICD-10-CM | POA: Diagnosis not present

## 2013-07-28 DIAGNOSIS — N3289 Other specified disorders of bladder: Secondary | ICD-10-CM | POA: Diagnosis not present

## 2013-07-28 DIAGNOSIS — N3090 Cystitis, unspecified without hematuria: Secondary | ICD-10-CM | POA: Diagnosis not present

## 2013-07-28 DIAGNOSIS — R310 Gross hematuria: Secondary | ICD-10-CM | POA: Diagnosis not present

## 2013-08-11 DIAGNOSIS — Z79899 Other long term (current) drug therapy: Secondary | ICD-10-CM | POA: Diagnosis not present

## 2013-08-11 DIAGNOSIS — I472 Ventricular tachycardia: Secondary | ICD-10-CM | POA: Diagnosis not present

## 2013-08-11 DIAGNOSIS — E119 Type 2 diabetes mellitus without complications: Secondary | ICD-10-CM | POA: Diagnosis not present

## 2013-08-11 DIAGNOSIS — I4729 Other ventricular tachycardia: Secondary | ICD-10-CM | POA: Diagnosis not present

## 2013-08-11 DIAGNOSIS — I10 Essential (primary) hypertension: Secondary | ICD-10-CM | POA: Diagnosis not present

## 2013-08-11 DIAGNOSIS — R079 Chest pain, unspecified: Secondary | ICD-10-CM | POA: Diagnosis not present

## 2013-08-17 DIAGNOSIS — D539 Nutritional anemia, unspecified: Secondary | ICD-10-CM | POA: Diagnosis not present

## 2013-08-17 DIAGNOSIS — E785 Hyperlipidemia, unspecified: Secondary | ICD-10-CM | POA: Diagnosis not present

## 2013-08-17 DIAGNOSIS — Z6841 Body Mass Index (BMI) 40.0 and over, adult: Secondary | ICD-10-CM | POA: Diagnosis not present

## 2013-08-17 DIAGNOSIS — Z23 Encounter for immunization: Secondary | ICD-10-CM | POA: Diagnosis not present

## 2013-08-17 DIAGNOSIS — E119 Type 2 diabetes mellitus without complications: Secondary | ICD-10-CM | POA: Diagnosis not present

## 2013-08-17 DIAGNOSIS — I10 Essential (primary) hypertension: Secondary | ICD-10-CM | POA: Diagnosis not present

## 2013-09-09 DIAGNOSIS — S9030XA Contusion of unspecified foot, initial encounter: Secondary | ICD-10-CM | POA: Diagnosis not present

## 2013-09-11 DIAGNOSIS — N390 Urinary tract infection, site not specified: Secondary | ICD-10-CM | POA: Diagnosis not present

## 2013-09-29 DIAGNOSIS — R110 Nausea: Secondary | ICD-10-CM | POA: Diagnosis not present

## 2013-09-29 DIAGNOSIS — K5289 Other specified noninfective gastroenteritis and colitis: Secondary | ICD-10-CM | POA: Diagnosis not present

## 2013-09-29 DIAGNOSIS — R5383 Other fatigue: Secondary | ICD-10-CM | POA: Diagnosis not present

## 2013-09-29 DIAGNOSIS — R5381 Other malaise: Secondary | ICD-10-CM | POA: Diagnosis not present

## 2013-09-29 DIAGNOSIS — R197 Diarrhea, unspecified: Secondary | ICD-10-CM | POA: Diagnosis not present

## 2013-09-29 DIAGNOSIS — R42 Dizziness and giddiness: Secondary | ICD-10-CM | POA: Diagnosis not present

## 2013-10-05 DIAGNOSIS — N390 Urinary tract infection, site not specified: Secondary | ICD-10-CM | POA: Diagnosis not present

## 2013-10-08 DIAGNOSIS — N390 Urinary tract infection, site not specified: Secondary | ICD-10-CM | POA: Diagnosis not present

## 2013-11-03 DIAGNOSIS — J0100 Acute maxillary sinusitis, unspecified: Secondary | ICD-10-CM | POA: Diagnosis not present

## 2013-11-20 DIAGNOSIS — F329 Major depressive disorder, single episode, unspecified: Secondary | ICD-10-CM | POA: Diagnosis not present

## 2013-11-20 DIAGNOSIS — D539 Nutritional anemia, unspecified: Secondary | ICD-10-CM | POA: Diagnosis not present

## 2013-11-20 DIAGNOSIS — L089 Local infection of the skin and subcutaneous tissue, unspecified: Secondary | ICD-10-CM | POA: Diagnosis not present

## 2013-11-20 DIAGNOSIS — E119 Type 2 diabetes mellitus without complications: Secondary | ICD-10-CM | POA: Diagnosis not present

## 2013-11-20 DIAGNOSIS — R300 Dysuria: Secondary | ICD-10-CM | POA: Diagnosis not present

## 2013-11-20 DIAGNOSIS — E785 Hyperlipidemia, unspecified: Secondary | ICD-10-CM | POA: Diagnosis not present

## 2013-11-20 DIAGNOSIS — N390 Urinary tract infection, site not specified: Secondary | ICD-10-CM | POA: Diagnosis not present

## 2013-12-14 DIAGNOSIS — E119 Type 2 diabetes mellitus without complications: Secondary | ICD-10-CM | POA: Diagnosis not present

## 2013-12-14 DIAGNOSIS — I472 Ventricular tachycardia: Secondary | ICD-10-CM | POA: Diagnosis not present

## 2013-12-14 DIAGNOSIS — Z6841 Body Mass Index (BMI) 40.0 and over, adult: Secondary | ICD-10-CM | POA: Diagnosis not present

## 2013-12-14 DIAGNOSIS — Z79899 Other long term (current) drug therapy: Secondary | ICD-10-CM | POA: Diagnosis not present

## 2013-12-14 DIAGNOSIS — E039 Hypothyroidism, unspecified: Secondary | ICD-10-CM | POA: Diagnosis not present

## 2013-12-14 DIAGNOSIS — I10 Essential (primary) hypertension: Secondary | ICD-10-CM | POA: Diagnosis not present

## 2014-02-26 DIAGNOSIS — E119 Type 2 diabetes mellitus without complications: Secondary | ICD-10-CM | POA: Diagnosis not present

## 2014-02-26 DIAGNOSIS — I10 Essential (primary) hypertension: Secondary | ICD-10-CM | POA: Diagnosis not present

## 2014-02-26 DIAGNOSIS — R0600 Dyspnea, unspecified: Secondary | ICD-10-CM | POA: Diagnosis not present

## 2014-02-26 DIAGNOSIS — R079 Chest pain, unspecified: Secondary | ICD-10-CM | POA: Diagnosis not present

## 2014-02-26 DIAGNOSIS — D539 Nutritional anemia, unspecified: Secondary | ICD-10-CM | POA: Diagnosis not present

## 2014-02-26 DIAGNOSIS — E785 Hyperlipidemia, unspecified: Secondary | ICD-10-CM | POA: Diagnosis not present

## 2014-02-26 DIAGNOSIS — R300 Dysuria: Secondary | ICD-10-CM | POA: Diagnosis not present

## 2014-03-10 DIAGNOSIS — I10 Essential (primary) hypertension: Secondary | ICD-10-CM | POA: Diagnosis not present

## 2014-03-10 DIAGNOSIS — R079 Chest pain, unspecified: Secondary | ICD-10-CM | POA: Diagnosis not present

## 2014-03-17 DIAGNOSIS — R079 Chest pain, unspecified: Secondary | ICD-10-CM | POA: Diagnosis not present

## 2014-03-17 DIAGNOSIS — I350 Nonrheumatic aortic (valve) stenosis: Secondary | ICD-10-CM | POA: Diagnosis not present

## 2014-03-17 DIAGNOSIS — I361 Nonrheumatic tricuspid (valve) insufficiency: Secondary | ICD-10-CM | POA: Diagnosis not present

## 2014-03-17 DIAGNOSIS — I071 Rheumatic tricuspid insufficiency: Secondary | ICD-10-CM | POA: Diagnosis not present

## 2014-03-18 DIAGNOSIS — I071 Rheumatic tricuspid insufficiency: Secondary | ICD-10-CM | POA: Diagnosis not present

## 2014-03-18 DIAGNOSIS — R079 Chest pain, unspecified: Secondary | ICD-10-CM | POA: Diagnosis not present

## 2014-04-06 DIAGNOSIS — N390 Urinary tract infection, site not specified: Secondary | ICD-10-CM | POA: Diagnosis not present

## 2014-04-15 ENCOUNTER — Other Ambulatory Visit: Payer: Self-pay | Admitting: Unknown Physician Specialty

## 2014-04-15 DIAGNOSIS — R195 Other fecal abnormalities: Secondary | ICD-10-CM | POA: Diagnosis not present

## 2014-04-15 DIAGNOSIS — E119 Type 2 diabetes mellitus without complications: Secondary | ICD-10-CM | POA: Diagnosis not present

## 2014-04-15 DIAGNOSIS — R197 Diarrhea, unspecified: Secondary | ICD-10-CM | POA: Diagnosis not present

## 2014-04-15 DIAGNOSIS — I10 Essential (primary) hypertension: Secondary | ICD-10-CM | POA: Diagnosis not present

## 2014-04-15 DIAGNOSIS — I2510 Atherosclerotic heart disease of native coronary artery without angina pectoris: Secondary | ICD-10-CM | POA: Diagnosis not present

## 2014-04-15 DIAGNOSIS — K219 Gastro-esophageal reflux disease without esophagitis: Secondary | ICD-10-CM | POA: Diagnosis not present

## 2014-04-18 DIAGNOSIS — R197 Diarrhea, unspecified: Secondary | ICD-10-CM | POA: Diagnosis not present

## 2014-04-18 DIAGNOSIS — R10817 Generalized abdominal tenderness: Secondary | ICD-10-CM | POA: Diagnosis not present

## 2014-04-18 DIAGNOSIS — E780 Pure hypercholesterolemia: Secondary | ICD-10-CM | POA: Diagnosis not present

## 2014-04-18 DIAGNOSIS — R109 Unspecified abdominal pain: Secondary | ICD-10-CM | POA: Diagnosis not present

## 2014-04-18 DIAGNOSIS — Z7901 Long term (current) use of anticoagulants: Secondary | ICD-10-CM | POA: Diagnosis not present

## 2014-04-18 DIAGNOSIS — E119 Type 2 diabetes mellitus without complications: Secondary | ICD-10-CM | POA: Diagnosis not present

## 2014-04-18 DIAGNOSIS — I10 Essential (primary) hypertension: Secondary | ICD-10-CM | POA: Diagnosis not present

## 2014-04-18 DIAGNOSIS — R1011 Right upper quadrant pain: Secondary | ICD-10-CM | POA: Diagnosis not present

## 2014-04-18 DIAGNOSIS — R1013 Epigastric pain: Secondary | ICD-10-CM | POA: Diagnosis not present

## 2014-04-18 DIAGNOSIS — R195 Other fecal abnormalities: Secondary | ICD-10-CM | POA: Diagnosis not present

## 2014-04-18 DIAGNOSIS — R112 Nausea with vomiting, unspecified: Secondary | ICD-10-CM | POA: Diagnosis not present

## 2014-04-18 DIAGNOSIS — K2970 Gastritis, unspecified, without bleeding: Secondary | ICD-10-CM | POA: Diagnosis not present

## 2014-04-21 DIAGNOSIS — N3289 Other specified disorders of bladder: Secondary | ICD-10-CM | POA: Diagnosis not present

## 2014-04-21 DIAGNOSIS — R109 Unspecified abdominal pain: Secondary | ICD-10-CM | POA: Diagnosis not present

## 2014-04-22 DIAGNOSIS — K5730 Diverticulosis of large intestine without perforation or abscess without bleeding: Secondary | ICD-10-CM | POA: Diagnosis not present

## 2014-04-22 DIAGNOSIS — R195 Other fecal abnormalities: Secondary | ICD-10-CM | POA: Diagnosis not present

## 2014-04-22 DIAGNOSIS — K921 Melena: Secondary | ICD-10-CM | POA: Diagnosis not present

## 2014-04-22 DIAGNOSIS — R197 Diarrhea, unspecified: Secondary | ICD-10-CM | POA: Diagnosis not present

## 2014-04-29 DIAGNOSIS — R197 Diarrhea, unspecified: Secondary | ICD-10-CM | POA: Diagnosis not present

## 2014-04-29 DIAGNOSIS — D649 Anemia, unspecified: Secondary | ICD-10-CM | POA: Diagnosis not present

## 2014-04-29 DIAGNOSIS — R1084 Generalized abdominal pain: Secondary | ICD-10-CM | POA: Diagnosis not present

## 2014-06-02 DIAGNOSIS — Z6841 Body Mass Index (BMI) 40.0 and over, adult: Secondary | ICD-10-CM | POA: Diagnosis not present

## 2014-06-02 DIAGNOSIS — J019 Acute sinusitis, unspecified: Secondary | ICD-10-CM | POA: Diagnosis not present

## 2014-06-02 DIAGNOSIS — F329 Major depressive disorder, single episode, unspecified: Secondary | ICD-10-CM | POA: Diagnosis not present

## 2014-06-02 DIAGNOSIS — E785 Hyperlipidemia, unspecified: Secondary | ICD-10-CM | POA: Diagnosis not present

## 2014-06-02 DIAGNOSIS — E119 Type 2 diabetes mellitus without complications: Secondary | ICD-10-CM | POA: Diagnosis not present

## 2014-06-02 DIAGNOSIS — R42 Dizziness and giddiness: Secondary | ICD-10-CM | POA: Diagnosis not present

## 2014-06-02 DIAGNOSIS — D539 Nutritional anemia, unspecified: Secondary | ICD-10-CM | POA: Diagnosis not present

## 2014-06-02 DIAGNOSIS — I10 Essential (primary) hypertension: Secondary | ICD-10-CM | POA: Diagnosis not present

## 2014-07-23 DIAGNOSIS — I472 Ventricular tachycardia, unspecified: Secondary | ICD-10-CM

## 2014-07-23 DIAGNOSIS — E119 Type 2 diabetes mellitus without complications: Secondary | ICD-10-CM

## 2014-07-23 DIAGNOSIS — I4729 Other ventricular tachycardia: Secondary | ICD-10-CM

## 2014-07-23 DIAGNOSIS — R072 Precordial pain: Secondary | ICD-10-CM

## 2014-07-23 DIAGNOSIS — I10 Essential (primary) hypertension: Secondary | ICD-10-CM | POA: Insufficient documentation

## 2014-07-23 DIAGNOSIS — D509 Iron deficiency anemia, unspecified: Secondary | ICD-10-CM | POA: Insufficient documentation

## 2014-07-23 HISTORY — DX: Morbid (severe) obesity due to excess calories: E66.01

## 2014-07-23 HISTORY — DX: Essential (primary) hypertension: I10

## 2014-07-23 HISTORY — DX: Other ventricular tachycardia: I47.29

## 2014-07-23 HISTORY — DX: Precordial pain: R07.2

## 2014-07-23 HISTORY — DX: Ventricular tachycardia: I47.2

## 2014-07-23 HISTORY — DX: Type 2 diabetes mellitus without complications: E11.9

## 2014-07-23 HISTORY — DX: Iron deficiency anemia, unspecified: D50.9

## 2014-07-23 HISTORY — DX: Ventricular tachycardia, unspecified: I47.20

## 2014-08-03 DIAGNOSIS — D649 Anemia, unspecified: Secondary | ICD-10-CM | POA: Diagnosis not present

## 2014-08-12 DIAGNOSIS — D638 Anemia in other chronic diseases classified elsewhere: Secondary | ICD-10-CM | POA: Diagnosis not present

## 2014-08-12 DIAGNOSIS — K219 Gastro-esophageal reflux disease without esophagitis: Secondary | ICD-10-CM | POA: Diagnosis not present

## 2014-08-12 DIAGNOSIS — K589 Irritable bowel syndrome without diarrhea: Secondary | ICD-10-CM | POA: Diagnosis not present

## 2014-09-08 DIAGNOSIS — E785 Hyperlipidemia, unspecified: Secondary | ICD-10-CM | POA: Diagnosis not present

## 2014-09-08 DIAGNOSIS — M79609 Pain in unspecified limb: Secondary | ICD-10-CM | POA: Diagnosis not present

## 2014-09-08 DIAGNOSIS — Z6841 Body Mass Index (BMI) 40.0 and over, adult: Secondary | ICD-10-CM | POA: Diagnosis not present

## 2014-09-08 DIAGNOSIS — E119 Type 2 diabetes mellitus without complications: Secondary | ICD-10-CM | POA: Diagnosis not present

## 2014-09-08 DIAGNOSIS — D539 Nutritional anemia, unspecified: Secondary | ICD-10-CM | POA: Diagnosis not present

## 2014-09-08 DIAGNOSIS — F329 Major depressive disorder, single episode, unspecified: Secondary | ICD-10-CM | POA: Diagnosis not present

## 2014-09-08 DIAGNOSIS — I10 Essential (primary) hypertension: Secondary | ICD-10-CM | POA: Diagnosis not present

## 2014-10-01 DIAGNOSIS — Z96651 Presence of right artificial knee joint: Secondary | ICD-10-CM | POA: Diagnosis not present

## 2014-10-01 DIAGNOSIS — S92415A Nondisplaced fracture of proximal phalanx of left great toe, initial encounter for closed fracture: Secondary | ICD-10-CM | POA: Diagnosis not present

## 2014-10-01 DIAGNOSIS — S8001XA Contusion of right knee, initial encounter: Secondary | ICD-10-CM | POA: Diagnosis not present

## 2014-10-01 DIAGNOSIS — M79673 Pain in unspecified foot: Secondary | ICD-10-CM | POA: Diagnosis not present

## 2014-10-01 DIAGNOSIS — M25561 Pain in right knee: Secondary | ICD-10-CM | POA: Diagnosis not present

## 2014-10-01 DIAGNOSIS — M79674 Pain in right toe(s): Secondary | ICD-10-CM | POA: Diagnosis not present

## 2014-11-29 ENCOUNTER — Encounter: Payer: Self-pay | Admitting: Podiatry

## 2014-11-29 ENCOUNTER — Ambulatory Visit (INDEPENDENT_AMBULATORY_CARE_PROVIDER_SITE_OTHER): Payer: Medicare Other | Admitting: Podiatry

## 2014-11-29 ENCOUNTER — Ambulatory Visit (INDEPENDENT_AMBULATORY_CARE_PROVIDER_SITE_OTHER): Payer: Medicare Other

## 2014-11-29 VITALS — BP 150/101 | HR 78 | Resp 14

## 2014-11-29 DIAGNOSIS — R52 Pain, unspecified: Secondary | ICD-10-CM

## 2014-11-29 DIAGNOSIS — S92912A Unspecified fracture of left toe(s), initial encounter for closed fracture: Secondary | ICD-10-CM | POA: Diagnosis not present

## 2014-11-29 NOTE — Progress Notes (Signed)
Subjective:     Patient ID: Vickie Ferguson, female   DOB: 1955-06-01, 59 y.o.   MRN: [id number]  HPI this patient presents the office with two complaints. She complains first of having a piece of glass in the outside bottom of her left foot. She states that that his been present for weeks and is painful as she walks on her left foot. She also relates that she had a fall at home where she injured the toes on both feet. She states this happened in September and the swelling in the pain has resolved, but she presents the office for further evaluation of the injury in her forefoot   Review of Systems     Objective:   Physical Exam GENERAL APPEARANCE: Alert, conversant. Appropriately groomed. No acute distress.  VASCULAR: Pedal pulses palpable but diminished at  [facility] and PT bilateral.  Capillary refill time is immediate to all digits,  Normal temperature gradient.  Digital hair growth is present bilateral  NEUROLOGIC: sensation is normal to 5.07 monofilament at 5/5 sites bilateral.  Light touch is intact bilateral, Muscle strength normal.  MUSCULOSKELETAL: acceptable muscle strength, tone and stability bilateral.  Intrinsic muscluature intact bilateral.  Rectus appearance of foot and digits noted bilateral.  There is swollen and enlarged second toe left foot.  DERMATOLOGIC: skin color, texture, and turgor are within normal limits.  No preulcerative lesions or ulcers  are seen, no interdigital maceration noted.  No open lesions present.  Digital nails are asymptomatic. No drainage noted. No entrance of glass noted on bottom of left foot.      Assessment:    Fracture Second toe left foot.  IE  Xray reveals fractured proximal phalanx second toe left foot.  No FB noted clinically or on x-ray.  Her x-ray does reveal dislocation of navicular-cunieform joint.  She says this was possibly  the result of her fall.  I might agree with her since her LOPs was WNL. She is also diabetic and this may be due to joint  changes in her left foot.  This may lead her to walk on the outside of her left foot which may give the impression of pain from FB. Dispensed elastic sock to help support her foot.  Gardiner Barefoot DPM    Plan:

## 2014-12-02 DIAGNOSIS — E119 Type 2 diabetes mellitus without complications: Secondary | ICD-10-CM | POA: Diagnosis not present

## 2014-12-02 DIAGNOSIS — D509 Iron deficiency anemia, unspecified: Secondary | ICD-10-CM | POA: Diagnosis not present

## 2014-12-02 DIAGNOSIS — I10 Essential (primary) hypertension: Secondary | ICD-10-CM | POA: Diagnosis not present

## 2014-12-02 DIAGNOSIS — R072 Precordial pain: Secondary | ICD-10-CM | POA: Diagnosis not present

## 2014-12-02 DIAGNOSIS — I472 Ventricular tachycardia: Secondary | ICD-10-CM | POA: Diagnosis not present

## 2014-12-15 DIAGNOSIS — J019 Acute sinusitis, unspecified: Secondary | ICD-10-CM | POA: Diagnosis not present

## 2014-12-15 DIAGNOSIS — R300 Dysuria: Secondary | ICD-10-CM | POA: Diagnosis not present

## 2014-12-15 DIAGNOSIS — E785 Hyperlipidemia, unspecified: Secondary | ICD-10-CM | POA: Diagnosis not present

## 2014-12-15 DIAGNOSIS — E119 Type 2 diabetes mellitus without complications: Secondary | ICD-10-CM | POA: Diagnosis not present

## 2014-12-15 DIAGNOSIS — Z6841 Body Mass Index (BMI) 40.0 and over, adult: Secondary | ICD-10-CM | POA: Diagnosis not present

## 2014-12-15 DIAGNOSIS — I10 Essential (primary) hypertension: Secondary | ICD-10-CM | POA: Diagnosis not present

## 2014-12-28 DIAGNOSIS — S51811A Laceration without foreign body of right forearm, initial encounter: Secondary | ICD-10-CM | POA: Diagnosis not present

## 2014-12-28 DIAGNOSIS — R51 Headache: Secondary | ICD-10-CM | POA: Diagnosis not present

## 2014-12-28 DIAGNOSIS — M79603 Pain in arm, unspecified: Secondary | ICD-10-CM | POA: Diagnosis not present

## 2014-12-28 DIAGNOSIS — M542 Cervicalgia: Secondary | ICD-10-CM | POA: Diagnosis not present

## 2014-12-28 DIAGNOSIS — S5001XA Contusion of right elbow, initial encounter: Secondary | ICD-10-CM | POA: Diagnosis not present

## 2014-12-28 DIAGNOSIS — Z23 Encounter for immunization: Secondary | ICD-10-CM | POA: Diagnosis not present

## 2014-12-28 DIAGNOSIS — S098XXA Other specified injuries of head, initial encounter: Secondary | ICD-10-CM | POA: Diagnosis not present

## 2015-03-17 DIAGNOSIS — I10 Essential (primary) hypertension: Secondary | ICD-10-CM | POA: Diagnosis not present

## 2015-03-17 DIAGNOSIS — E119 Type 2 diabetes mellitus without complications: Secondary | ICD-10-CM | POA: Diagnosis not present

## 2015-03-17 DIAGNOSIS — Z6841 Body Mass Index (BMI) 40.0 and over, adult: Secondary | ICD-10-CM | POA: Diagnosis not present

## 2015-03-17 DIAGNOSIS — Z1389 Encounter for screening for other disorder: Secondary | ICD-10-CM | POA: Diagnosis not present

## 2015-03-17 DIAGNOSIS — G8929 Other chronic pain: Secondary | ICD-10-CM | POA: Diagnosis not present

## 2015-03-17 DIAGNOSIS — K219 Gastro-esophageal reflux disease without esophagitis: Secondary | ICD-10-CM | POA: Diagnosis not present

## 2015-03-17 DIAGNOSIS — F329 Major depressive disorder, single episode, unspecified: Secondary | ICD-10-CM | POA: Diagnosis not present

## 2015-03-17 DIAGNOSIS — E785 Hyperlipidemia, unspecified: Secondary | ICD-10-CM | POA: Diagnosis not present

## 2015-03-17 DIAGNOSIS — D539 Nutritional anemia, unspecified: Secondary | ICD-10-CM | POA: Diagnosis not present

## 2015-04-05 DIAGNOSIS — I10 Essential (primary) hypertension: Secondary | ICD-10-CM | POA: Diagnosis not present

## 2015-04-05 DIAGNOSIS — D539 Nutritional anemia, unspecified: Secondary | ICD-10-CM | POA: Diagnosis not present

## 2015-04-05 DIAGNOSIS — Z6841 Body Mass Index (BMI) 40.0 and over, adult: Secondary | ICD-10-CM | POA: Diagnosis not present

## 2015-04-05 DIAGNOSIS — I6789 Other cerebrovascular disease: Secondary | ICD-10-CM | POA: Diagnosis not present

## 2015-04-05 DIAGNOSIS — R262 Difficulty in walking, not elsewhere classified: Secondary | ICD-10-CM | POA: Diagnosis not present

## 2015-04-12 DIAGNOSIS — I361 Nonrheumatic tricuspid (valve) insufficiency: Secondary | ICD-10-CM | POA: Diagnosis not present

## 2015-04-12 DIAGNOSIS — M6281 Muscle weakness (generalized): Secondary | ICD-10-CM | POA: Diagnosis not present

## 2015-04-12 DIAGNOSIS — Z79891 Long term (current) use of opiate analgesic: Secondary | ICD-10-CM | POA: Diagnosis not present

## 2015-04-12 DIAGNOSIS — E119 Type 2 diabetes mellitus without complications: Secondary | ICD-10-CM | POA: Diagnosis not present

## 2015-04-12 DIAGNOSIS — I69392 Facial weakness following cerebral infarction: Secondary | ICD-10-CM | POA: Diagnosis not present

## 2015-04-12 DIAGNOSIS — I6789 Other cerebrovascular disease: Secondary | ICD-10-CM | POA: Diagnosis not present

## 2015-04-12 DIAGNOSIS — Z6841 Body Mass Index (BMI) 40.0 and over, adult: Secondary | ICD-10-CM | POA: Diagnosis not present

## 2015-04-12 DIAGNOSIS — I69398 Other sequelae of cerebral infarction: Secondary | ICD-10-CM | POA: Diagnosis not present

## 2015-04-12 DIAGNOSIS — I69393 Ataxia following cerebral infarction: Secondary | ICD-10-CM | POA: Diagnosis not present

## 2015-04-12 DIAGNOSIS — I6523 Occlusion and stenosis of bilateral carotid arteries: Secondary | ICD-10-CM | POA: Diagnosis not present

## 2015-04-12 DIAGNOSIS — F329 Major depressive disorder, single episode, unspecified: Secondary | ICD-10-CM | POA: Diagnosis not present

## 2015-04-12 DIAGNOSIS — I10 Essential (primary) hypertension: Secondary | ICD-10-CM | POA: Diagnosis not present

## 2015-04-12 DIAGNOSIS — Z9181 History of falling: Secondary | ICD-10-CM | POA: Diagnosis not present

## 2015-04-12 DIAGNOSIS — Z7902 Long term (current) use of antithrombotics/antiplatelets: Secondary | ICD-10-CM | POA: Diagnosis not present

## 2015-04-12 DIAGNOSIS — I639 Cerebral infarction, unspecified: Secondary | ICD-10-CM | POA: Diagnosis not present

## 2015-04-12 DIAGNOSIS — Z7984 Long term (current) use of oral hypoglycemic drugs: Secondary | ICD-10-CM | POA: Diagnosis not present

## 2015-04-14 DIAGNOSIS — I10 Essential (primary) hypertension: Secondary | ICD-10-CM | POA: Diagnosis not present

## 2015-04-14 DIAGNOSIS — M6281 Muscle weakness (generalized): Secondary | ICD-10-CM | POA: Diagnosis not present

## 2015-04-14 DIAGNOSIS — I69392 Facial weakness following cerebral infarction: Secondary | ICD-10-CM | POA: Diagnosis not present

## 2015-04-14 DIAGNOSIS — I69393 Ataxia following cerebral infarction: Secondary | ICD-10-CM | POA: Diagnosis not present

## 2015-04-14 DIAGNOSIS — I69398 Other sequelae of cerebral infarction: Secondary | ICD-10-CM | POA: Diagnosis not present

## 2015-04-14 DIAGNOSIS — E119 Type 2 diabetes mellitus without complications: Secondary | ICD-10-CM | POA: Diagnosis not present

## 2015-04-18 DIAGNOSIS — I69398 Other sequelae of cerebral infarction: Secondary | ICD-10-CM | POA: Diagnosis not present

## 2015-04-18 DIAGNOSIS — I69392 Facial weakness following cerebral infarction: Secondary | ICD-10-CM | POA: Diagnosis not present

## 2015-04-18 DIAGNOSIS — I10 Essential (primary) hypertension: Secondary | ICD-10-CM | POA: Diagnosis not present

## 2015-04-18 DIAGNOSIS — E119 Type 2 diabetes mellitus without complications: Secondary | ICD-10-CM | POA: Diagnosis not present

## 2015-04-18 DIAGNOSIS — I69393 Ataxia following cerebral infarction: Secondary | ICD-10-CM | POA: Diagnosis not present

## 2015-04-18 DIAGNOSIS — M6281 Muscle weakness (generalized): Secondary | ICD-10-CM | POA: Diagnosis not present

## 2015-04-19 DIAGNOSIS — D539 Nutritional anemia, unspecified: Secondary | ICD-10-CM | POA: Diagnosis not present

## 2015-04-19 DIAGNOSIS — I69392 Facial weakness following cerebral infarction: Secondary | ICD-10-CM | POA: Diagnosis not present

## 2015-04-19 DIAGNOSIS — I69393 Ataxia following cerebral infarction: Secondary | ICD-10-CM | POA: Diagnosis not present

## 2015-04-19 DIAGNOSIS — I10 Essential (primary) hypertension: Secondary | ICD-10-CM | POA: Diagnosis not present

## 2015-04-19 DIAGNOSIS — M25551 Pain in right hip: Secondary | ICD-10-CM | POA: Diagnosis not present

## 2015-04-19 DIAGNOSIS — I6789 Other cerebrovascular disease: Secondary | ICD-10-CM | POA: Diagnosis not present

## 2015-04-19 DIAGNOSIS — M6281 Muscle weakness (generalized): Secondary | ICD-10-CM | POA: Diagnosis not present

## 2015-04-19 DIAGNOSIS — I69398 Other sequelae of cerebral infarction: Secondary | ICD-10-CM | POA: Diagnosis not present

## 2015-04-19 DIAGNOSIS — E119 Type 2 diabetes mellitus without complications: Secondary | ICD-10-CM | POA: Diagnosis not present

## 2015-04-19 DIAGNOSIS — Z6841 Body Mass Index (BMI) 40.0 and over, adult: Secondary | ICD-10-CM | POA: Diagnosis not present

## 2015-04-20 DIAGNOSIS — I69393 Ataxia following cerebral infarction: Secondary | ICD-10-CM | POA: Diagnosis not present

## 2015-04-20 DIAGNOSIS — M6281 Muscle weakness (generalized): Secondary | ICD-10-CM | POA: Diagnosis not present

## 2015-04-20 DIAGNOSIS — I10 Essential (primary) hypertension: Secondary | ICD-10-CM | POA: Diagnosis not present

## 2015-04-20 DIAGNOSIS — I69392 Facial weakness following cerebral infarction: Secondary | ICD-10-CM | POA: Diagnosis not present

## 2015-04-20 DIAGNOSIS — I69398 Other sequelae of cerebral infarction: Secondary | ICD-10-CM | POA: Diagnosis not present

## 2015-04-20 DIAGNOSIS — E119 Type 2 diabetes mellitus without complications: Secondary | ICD-10-CM | POA: Diagnosis not present

## 2015-04-21 DIAGNOSIS — I10 Essential (primary) hypertension: Secondary | ICD-10-CM | POA: Diagnosis not present

## 2015-04-21 DIAGNOSIS — Z794 Long term (current) use of insulin: Secondary | ICD-10-CM | POA: Diagnosis not present

## 2015-04-21 DIAGNOSIS — I69398 Other sequelae of cerebral infarction: Secondary | ICD-10-CM | POA: Diagnosis not present

## 2015-04-21 DIAGNOSIS — I69392 Facial weakness following cerebral infarction: Secondary | ICD-10-CM | POA: Diagnosis not present

## 2015-04-21 DIAGNOSIS — I472 Ventricular tachycardia: Secondary | ICD-10-CM | POA: Diagnosis not present

## 2015-04-21 DIAGNOSIS — I69393 Ataxia following cerebral infarction: Secondary | ICD-10-CM | POA: Diagnosis not present

## 2015-04-21 DIAGNOSIS — E119 Type 2 diabetes mellitus without complications: Secondary | ICD-10-CM | POA: Diagnosis not present

## 2015-04-21 DIAGNOSIS — D508 Other iron deficiency anemias: Secondary | ICD-10-CM | POA: Diagnosis not present

## 2015-04-21 DIAGNOSIS — M6281 Muscle weakness (generalized): Secondary | ICD-10-CM | POA: Diagnosis not present

## 2015-04-25 DIAGNOSIS — E119 Type 2 diabetes mellitus without complications: Secondary | ICD-10-CM | POA: Diagnosis not present

## 2015-04-25 DIAGNOSIS — I69393 Ataxia following cerebral infarction: Secondary | ICD-10-CM | POA: Diagnosis not present

## 2015-04-25 DIAGNOSIS — I10 Essential (primary) hypertension: Secondary | ICD-10-CM | POA: Diagnosis not present

## 2015-04-25 DIAGNOSIS — I69398 Other sequelae of cerebral infarction: Secondary | ICD-10-CM | POA: Diagnosis not present

## 2015-04-25 DIAGNOSIS — I69392 Facial weakness following cerebral infarction: Secondary | ICD-10-CM | POA: Diagnosis not present

## 2015-04-25 DIAGNOSIS — M6281 Muscle weakness (generalized): Secondary | ICD-10-CM | POA: Diagnosis not present

## 2015-04-26 DIAGNOSIS — M6281 Muscle weakness (generalized): Secondary | ICD-10-CM | POA: Diagnosis not present

## 2015-04-26 DIAGNOSIS — E119 Type 2 diabetes mellitus without complications: Secondary | ICD-10-CM | POA: Diagnosis not present

## 2015-04-26 DIAGNOSIS — I69393 Ataxia following cerebral infarction: Secondary | ICD-10-CM | POA: Diagnosis not present

## 2015-04-26 DIAGNOSIS — I69392 Facial weakness following cerebral infarction: Secondary | ICD-10-CM | POA: Diagnosis not present

## 2015-04-26 DIAGNOSIS — I10 Essential (primary) hypertension: Secondary | ICD-10-CM | POA: Diagnosis not present

## 2015-04-26 DIAGNOSIS — I69398 Other sequelae of cerebral infarction: Secondary | ICD-10-CM | POA: Diagnosis not present

## 2015-04-27 DIAGNOSIS — I69392 Facial weakness following cerebral infarction: Secondary | ICD-10-CM | POA: Diagnosis not present

## 2015-04-27 DIAGNOSIS — M6281 Muscle weakness (generalized): Secondary | ICD-10-CM | POA: Diagnosis not present

## 2015-04-27 DIAGNOSIS — I69393 Ataxia following cerebral infarction: Secondary | ICD-10-CM | POA: Diagnosis not present

## 2015-04-27 DIAGNOSIS — E119 Type 2 diabetes mellitus without complications: Secondary | ICD-10-CM | POA: Diagnosis not present

## 2015-04-27 DIAGNOSIS — I10 Essential (primary) hypertension: Secondary | ICD-10-CM | POA: Diagnosis not present

## 2015-04-27 DIAGNOSIS — I69398 Other sequelae of cerebral infarction: Secondary | ICD-10-CM | POA: Diagnosis not present

## 2015-04-28 DIAGNOSIS — M6281 Muscle weakness (generalized): Secondary | ICD-10-CM | POA: Diagnosis not present

## 2015-04-28 DIAGNOSIS — I69393 Ataxia following cerebral infarction: Secondary | ICD-10-CM | POA: Diagnosis not present

## 2015-04-28 DIAGNOSIS — E119 Type 2 diabetes mellitus without complications: Secondary | ICD-10-CM | POA: Diagnosis not present

## 2015-04-28 DIAGNOSIS — I10 Essential (primary) hypertension: Secondary | ICD-10-CM | POA: Diagnosis not present

## 2015-04-28 DIAGNOSIS — I69392 Facial weakness following cerebral infarction: Secondary | ICD-10-CM | POA: Diagnosis not present

## 2015-04-28 DIAGNOSIS — I69398 Other sequelae of cerebral infarction: Secondary | ICD-10-CM | POA: Diagnosis not present

## 2015-05-03 DIAGNOSIS — I69393 Ataxia following cerebral infarction: Secondary | ICD-10-CM | POA: Diagnosis not present

## 2015-05-03 DIAGNOSIS — I10 Essential (primary) hypertension: Secondary | ICD-10-CM | POA: Diagnosis not present

## 2015-05-03 DIAGNOSIS — M6281 Muscle weakness (generalized): Secondary | ICD-10-CM | POA: Diagnosis not present

## 2015-05-03 DIAGNOSIS — I69398 Other sequelae of cerebral infarction: Secondary | ICD-10-CM | POA: Diagnosis not present

## 2015-05-03 DIAGNOSIS — I69392 Facial weakness following cerebral infarction: Secondary | ICD-10-CM | POA: Diagnosis not present

## 2015-05-03 DIAGNOSIS — E119 Type 2 diabetes mellitus without complications: Secondary | ICD-10-CM | POA: Diagnosis not present

## 2015-05-05 DIAGNOSIS — I10 Essential (primary) hypertension: Secondary | ICD-10-CM | POA: Diagnosis not present

## 2015-05-05 DIAGNOSIS — E119 Type 2 diabetes mellitus without complications: Secondary | ICD-10-CM | POA: Diagnosis not present

## 2015-05-05 DIAGNOSIS — I69392 Facial weakness following cerebral infarction: Secondary | ICD-10-CM | POA: Diagnosis not present

## 2015-05-05 DIAGNOSIS — I69398 Other sequelae of cerebral infarction: Secondary | ICD-10-CM | POA: Diagnosis not present

## 2015-05-05 DIAGNOSIS — I69393 Ataxia following cerebral infarction: Secondary | ICD-10-CM | POA: Diagnosis not present

## 2015-05-05 DIAGNOSIS — M6281 Muscle weakness (generalized): Secondary | ICD-10-CM | POA: Diagnosis not present

## 2015-05-09 DIAGNOSIS — I69398 Other sequelae of cerebral infarction: Secondary | ICD-10-CM | POA: Diagnosis not present

## 2015-05-09 DIAGNOSIS — I10 Essential (primary) hypertension: Secondary | ICD-10-CM | POA: Diagnosis not present

## 2015-05-09 DIAGNOSIS — M6281 Muscle weakness (generalized): Secondary | ICD-10-CM | POA: Diagnosis not present

## 2015-05-09 DIAGNOSIS — I69393 Ataxia following cerebral infarction: Secondary | ICD-10-CM | POA: Diagnosis not present

## 2015-05-09 DIAGNOSIS — I69392 Facial weakness following cerebral infarction: Secondary | ICD-10-CM | POA: Diagnosis not present

## 2015-05-09 DIAGNOSIS — E119 Type 2 diabetes mellitus without complications: Secondary | ICD-10-CM | POA: Diagnosis not present

## 2015-05-11 DIAGNOSIS — I69398 Other sequelae of cerebral infarction: Secondary | ICD-10-CM | POA: Diagnosis not present

## 2015-05-11 DIAGNOSIS — M6281 Muscle weakness (generalized): Secondary | ICD-10-CM | POA: Diagnosis not present

## 2015-05-11 DIAGNOSIS — I69393 Ataxia following cerebral infarction: Secondary | ICD-10-CM | POA: Diagnosis not present

## 2015-05-11 DIAGNOSIS — I69392 Facial weakness following cerebral infarction: Secondary | ICD-10-CM | POA: Diagnosis not present

## 2015-05-11 DIAGNOSIS — E119 Type 2 diabetes mellitus without complications: Secondary | ICD-10-CM | POA: Diagnosis not present

## 2015-05-11 DIAGNOSIS — I10 Essential (primary) hypertension: Secondary | ICD-10-CM | POA: Diagnosis not present

## 2015-06-22 DIAGNOSIS — F329 Major depressive disorder, single episode, unspecified: Secondary | ICD-10-CM | POA: Diagnosis not present

## 2015-06-22 DIAGNOSIS — Z6841 Body Mass Index (BMI) 40.0 and over, adult: Secondary | ICD-10-CM | POA: Diagnosis not present

## 2015-06-22 DIAGNOSIS — I10 Essential (primary) hypertension: Secondary | ICD-10-CM | POA: Diagnosis not present

## 2015-06-22 DIAGNOSIS — D539 Nutritional anemia, unspecified: Secondary | ICD-10-CM | POA: Diagnosis not present

## 2015-06-22 DIAGNOSIS — E119 Type 2 diabetes mellitus without complications: Secondary | ICD-10-CM | POA: Diagnosis not present

## 2015-06-22 DIAGNOSIS — K219 Gastro-esophageal reflux disease without esophagitis: Secondary | ICD-10-CM | POA: Diagnosis not present

## 2015-06-22 DIAGNOSIS — E785 Hyperlipidemia, unspecified: Secondary | ICD-10-CM | POA: Diagnosis not present

## 2015-07-14 DIAGNOSIS — I10 Essential (primary) hypertension: Secondary | ICD-10-CM | POA: Diagnosis not present

## 2015-07-14 DIAGNOSIS — Z6841 Body Mass Index (BMI) 40.0 and over, adult: Secondary | ICD-10-CM | POA: Diagnosis not present

## 2015-07-14 DIAGNOSIS — D539 Nutritional anemia, unspecified: Secondary | ICD-10-CM | POA: Diagnosis not present

## 2015-07-28 DIAGNOSIS — I10 Essential (primary) hypertension: Secondary | ICD-10-CM | POA: Diagnosis not present

## 2015-07-28 DIAGNOSIS — Z794 Long term (current) use of insulin: Secondary | ICD-10-CM | POA: Diagnosis not present

## 2015-07-28 DIAGNOSIS — E119 Type 2 diabetes mellitus without complications: Secondary | ICD-10-CM | POA: Diagnosis not present

## 2015-07-28 DIAGNOSIS — I472 Ventricular tachycardia: Secondary | ICD-10-CM | POA: Diagnosis not present

## 2015-07-28 DIAGNOSIS — R072 Precordial pain: Secondary | ICD-10-CM | POA: Diagnosis not present

## 2015-08-15 DIAGNOSIS — D509 Iron deficiency anemia, unspecified: Secondary | ICD-10-CM | POA: Diagnosis not present

## 2015-08-17 DIAGNOSIS — D509 Iron deficiency anemia, unspecified: Secondary | ICD-10-CM | POA: Diagnosis not present

## 2015-08-22 DIAGNOSIS — D509 Iron deficiency anemia, unspecified: Secondary | ICD-10-CM | POA: Diagnosis not present

## 2015-08-29 DIAGNOSIS — K921 Melena: Secondary | ICD-10-CM | POA: Diagnosis not present

## 2015-09-15 DIAGNOSIS — D649 Anemia, unspecified: Secondary | ICD-10-CM | POA: Diagnosis not present

## 2015-09-26 ENCOUNTER — Encounter: Payer: Self-pay | Admitting: Podiatry

## 2015-09-26 ENCOUNTER — Ambulatory Visit (INDEPENDENT_AMBULATORY_CARE_PROVIDER_SITE_OTHER): Payer: Medicare Other | Admitting: Podiatry

## 2015-09-26 VITALS — BP 151/77 | HR 65 | Resp 16

## 2015-09-26 DIAGNOSIS — Q828 Other specified congenital malformations of skin: Secondary | ICD-10-CM

## 2015-09-26 DIAGNOSIS — R52 Pain, unspecified: Secondary | ICD-10-CM

## 2015-09-26 NOTE — Addendum Note (Signed)
Addended byDeidre Ala, Martavius Lusty L on: 09/26/2015 11:45 AM   Modules accepted: Orders

## 2015-09-26 NOTE — Progress Notes (Signed)
This patient presents the office with chief complaint of a painful left heel. She states that she has a history of stepping on bark months ago. She says this area has become painful and sore and she is having difficulty walking when she wears her shoes. She denies any drainage from the left heel. She has provided no self treatment nor sought any professional help. She is a diabetic, but she is under good control. She presents the office today for evaluation and treatment of this condition   GENERAL APPEARANCE: Alert, conversant. Appropriately groomed. No acute distress.  VASCULAR: Pedal pulses are  Diminished  DP and PT bilateral.  Capillary refill time is immediate to all digits,  Normal temperature gradient.   NEUROLOGIC: sensation is normal to 5.07 monofilament at 5/5 sites bilateral.  Light touch is intact bilateral, Muscle strength normal.  MUSCULOSKELETAL: acceptable muscle strength, tone and stability bilateral.  Intrinsic muscluature intact bilateral.  Rectus appearance of foot and digits noted bilateral.   DERMATOLOGIC: skin color, texture, and turgor are within normal limits.  No preulcerative lesions or ulcers  are seen, no interdigital maceration noted.  No open lesions present.  Digital nails are asymptomatic. No drainage noted. Porokeratotic lesion left heel.  Porokeratosis left heel.   Debride porokeratosis. The left heel is painful to the touch at the site of the porokeratosis the porokeratosis was to provided no evidence of bark or any foreign body is noted.  No redness or drainage is noted. Padding was applied to the poro  keratotic lesion. If this lesion continues to be painful, we should plan on removing and excising the lesion from her left heel.  RTC prn.   Gardiner Barefoot DPM

## 2015-09-28 DIAGNOSIS — G8929 Other chronic pain: Secondary | ICD-10-CM | POA: Diagnosis not present

## 2015-09-28 DIAGNOSIS — D539 Nutritional anemia, unspecified: Secondary | ICD-10-CM | POA: Diagnosis not present

## 2015-09-28 DIAGNOSIS — Z1231 Encounter for screening mammogram for malignant neoplasm of breast: Secondary | ICD-10-CM | POA: Diagnosis not present

## 2015-09-28 DIAGNOSIS — R600 Localized edema: Secondary | ICD-10-CM | POA: Diagnosis not present

## 2015-09-28 DIAGNOSIS — E785 Hyperlipidemia, unspecified: Secondary | ICD-10-CM | POA: Diagnosis not present

## 2015-09-28 DIAGNOSIS — I10 Essential (primary) hypertension: Secondary | ICD-10-CM | POA: Diagnosis not present

## 2015-09-28 DIAGNOSIS — E119 Type 2 diabetes mellitus without complications: Secondary | ICD-10-CM | POA: Diagnosis not present

## 2015-10-10 DIAGNOSIS — D509 Iron deficiency anemia, unspecified: Secondary | ICD-10-CM | POA: Diagnosis not present

## 2015-10-10 DIAGNOSIS — K219 Gastro-esophageal reflux disease without esophagitis: Secondary | ICD-10-CM | POA: Diagnosis not present

## 2015-10-10 DIAGNOSIS — K921 Melena: Secondary | ICD-10-CM | POA: Diagnosis not present

## 2015-10-14 DIAGNOSIS — Z1231 Encounter for screening mammogram for malignant neoplasm of breast: Secondary | ICD-10-CM | POA: Diagnosis not present

## 2015-10-14 DIAGNOSIS — E119 Type 2 diabetes mellitus without complications: Secondary | ICD-10-CM | POA: Diagnosis not present

## 2015-10-14 DIAGNOSIS — J019 Acute sinusitis, unspecified: Secondary | ICD-10-CM | POA: Diagnosis not present

## 2015-10-14 DIAGNOSIS — J309 Allergic rhinitis, unspecified: Secondary | ICD-10-CM | POA: Diagnosis not present

## 2015-10-24 DIAGNOSIS — D509 Iron deficiency anemia, unspecified: Secondary | ICD-10-CM | POA: Diagnosis not present

## 2015-11-01 DIAGNOSIS — I10 Essential (primary) hypertension: Secondary | ICD-10-CM | POA: Diagnosis not present

## 2015-11-01 DIAGNOSIS — E119 Type 2 diabetes mellitus without complications: Secondary | ICD-10-CM | POA: Diagnosis not present

## 2015-11-01 DIAGNOSIS — G4730 Sleep apnea, unspecified: Secondary | ICD-10-CM | POA: Diagnosis not present

## 2015-11-01 DIAGNOSIS — Z8673 Personal history of transient ischemic attack (TIA), and cerebral infarction without residual deficits: Secondary | ICD-10-CM | POA: Diagnosis not present

## 2015-11-01 DIAGNOSIS — D124 Benign neoplasm of descending colon: Secondary | ICD-10-CM | POA: Diagnosis not present

## 2015-11-01 DIAGNOSIS — K589 Irritable bowel syndrome without diarrhea: Secondary | ICD-10-CM | POA: Diagnosis not present

## 2015-11-01 DIAGNOSIS — D509 Iron deficiency anemia, unspecified: Secondary | ICD-10-CM | POA: Diagnosis not present

## 2015-11-01 DIAGNOSIS — Z7984 Long term (current) use of oral hypoglycemic drugs: Secondary | ICD-10-CM | POA: Diagnosis not present

## 2015-11-01 DIAGNOSIS — K5730 Diverticulosis of large intestine without perforation or abscess without bleeding: Secondary | ICD-10-CM | POA: Diagnosis not present

## 2015-11-01 DIAGNOSIS — D126 Benign neoplasm of colon, unspecified: Secondary | ICD-10-CM | POA: Diagnosis not present

## 2015-11-01 DIAGNOSIS — E079 Disorder of thyroid, unspecified: Secondary | ICD-10-CM | POA: Diagnosis not present

## 2015-11-01 DIAGNOSIS — D122 Benign neoplasm of ascending colon: Secondary | ICD-10-CM | POA: Diagnosis not present

## 2015-11-01 DIAGNOSIS — K2970 Gastritis, unspecified, without bleeding: Secondary | ICD-10-CM | POA: Diagnosis not present

## 2015-11-01 DIAGNOSIS — Z79899 Other long term (current) drug therapy: Secondary | ICD-10-CM | POA: Diagnosis not present

## 2015-11-03 DIAGNOSIS — Z1231 Encounter for screening mammogram for malignant neoplasm of breast: Secondary | ICD-10-CM | POA: Diagnosis not present

## 2015-11-14 ENCOUNTER — Ambulatory Visit (INDEPENDENT_AMBULATORY_CARE_PROVIDER_SITE_OTHER): Payer: Medicare Other | Admitting: Podiatry

## 2015-11-14 ENCOUNTER — Encounter: Payer: Self-pay | Admitting: Podiatry

## 2015-11-14 VITALS — Ht 65.0 in | Wt 258.0 lb

## 2015-11-14 DIAGNOSIS — R52 Pain, unspecified: Secondary | ICD-10-CM

## 2015-11-14 DIAGNOSIS — Q828 Other specified congenital malformations of skin: Secondary | ICD-10-CM

## 2015-11-14 NOTE — Progress Notes (Signed)
This patient presents the office with chief complaint of a painful right heel.  She says this area has become painful and sore and she is having difficulty walking when she wears her shoes. She denies any drainage from the right  heel. She has provided no self treatment nor sought any professional help. She is a diabetic, but she is under good control. She presents the office today for evaluation and treatment of this condition   GENERAL APPEARANCE: Alert, conversant. Appropriately groomed. No acute distress.  VASCULAR: Pedal pulses are  Diminished  DP and PT bilateral.  Capillary refill time is immediate to all digits,  Normal temperature gradient.   NEUROLOGIC: sensation is normal to 5.07 monofilament at 5/5 sites bilateral.  Light touch is intact bilateral, Muscle strength normal.  MUSCULOSKELETAL: acceptable muscle strength, tone and stability bilateral.  Intrinsic muscluature intact bilateral.  Rectus appearance of foot and digits noted bilateral.   DERMATOLOGIC: skin color, texture, and turgor are within normal limits.  No preulcerative lesions or ulcers  are seen, no interdigital maceration noted.  No open lesions present.  Digital nails are asymptomatic. No drainage noted. Porokeratotic lesion left heel and painful porokeratosis right heel.  Porokeratosis right  heel.   Debride porokeratosis heels  B/L.    RTC prn.   Gardiner Barefoot DPM

## 2015-11-21 DIAGNOSIS — R072 Precordial pain: Secondary | ICD-10-CM | POA: Diagnosis not present

## 2015-11-21 DIAGNOSIS — I472 Ventricular tachycardia: Secondary | ICD-10-CM | POA: Diagnosis not present

## 2015-11-21 DIAGNOSIS — E119 Type 2 diabetes mellitus without complications: Secondary | ICD-10-CM | POA: Diagnosis not present

## 2015-11-21 DIAGNOSIS — Z794 Long term (current) use of insulin: Secondary | ICD-10-CM | POA: Diagnosis not present

## 2015-11-21 DIAGNOSIS — I10 Essential (primary) hypertension: Secondary | ICD-10-CM | POA: Diagnosis not present

## 2015-12-05 DIAGNOSIS — H26491 Other secondary cataract, right eye: Secondary | ICD-10-CM | POA: Diagnosis not present

## 2015-12-22 DIAGNOSIS — D509 Iron deficiency anemia, unspecified: Secondary | ICD-10-CM | POA: Diagnosis not present

## 2015-12-22 DIAGNOSIS — D510 Vitamin B12 deficiency anemia due to intrinsic factor deficiency: Secondary | ICD-10-CM | POA: Diagnosis not present

## 2015-12-28 DIAGNOSIS — D126 Benign neoplasm of colon, unspecified: Secondary | ICD-10-CM | POA: Diagnosis not present

## 2015-12-28 DIAGNOSIS — D649 Anemia, unspecified: Secondary | ICD-10-CM | POA: Diagnosis not present

## 2015-12-28 DIAGNOSIS — K5730 Diverticulosis of large intestine without perforation or abscess without bleeding: Secondary | ICD-10-CM | POA: Diagnosis not present

## 2015-12-28 DIAGNOSIS — D510 Vitamin B12 deficiency anemia due to intrinsic factor deficiency: Secondary | ICD-10-CM | POA: Diagnosis not present

## 2015-12-29 DIAGNOSIS — Z2821 Immunization not carried out because of patient refusal: Secondary | ICD-10-CM | POA: Diagnosis not present

## 2015-12-29 DIAGNOSIS — E785 Hyperlipidemia, unspecified: Secondary | ICD-10-CM | POA: Diagnosis not present

## 2015-12-29 DIAGNOSIS — F419 Anxiety disorder, unspecified: Secondary | ICD-10-CM | POA: Diagnosis not present

## 2015-12-29 DIAGNOSIS — I10 Essential (primary) hypertension: Secondary | ICD-10-CM | POA: Diagnosis not present

## 2015-12-29 DIAGNOSIS — K219 Gastro-esophageal reflux disease without esophagitis: Secondary | ICD-10-CM | POA: Diagnosis not present

## 2015-12-29 DIAGNOSIS — E119 Type 2 diabetes mellitus without complications: Secondary | ICD-10-CM | POA: Diagnosis not present

## 2015-12-29 DIAGNOSIS — D539 Nutritional anemia, unspecified: Secondary | ICD-10-CM | POA: Diagnosis not present

## 2015-12-29 DIAGNOSIS — G8929 Other chronic pain: Secondary | ICD-10-CM | POA: Diagnosis not present

## 2016-01-11 DIAGNOSIS — I472 Ventricular tachycardia: Secondary | ICD-10-CM | POA: Diagnosis not present

## 2016-01-11 DIAGNOSIS — E119 Type 2 diabetes mellitus without complications: Secondary | ICD-10-CM | POA: Diagnosis not present

## 2016-01-11 DIAGNOSIS — I10 Essential (primary) hypertension: Secondary | ICD-10-CM | POA: Diagnosis not present

## 2016-01-11 DIAGNOSIS — R072 Precordial pain: Secondary | ICD-10-CM | POA: Diagnosis not present

## 2016-01-11 DIAGNOSIS — Z794 Long term (current) use of insulin: Secondary | ICD-10-CM | POA: Diagnosis not present

## 2016-01-24 DIAGNOSIS — R072 Precordial pain: Secondary | ICD-10-CM | POA: Diagnosis not present

## 2016-01-24 DIAGNOSIS — I472 Ventricular tachycardia: Secondary | ICD-10-CM | POA: Diagnosis not present

## 2016-01-24 DIAGNOSIS — I10 Essential (primary) hypertension: Secondary | ICD-10-CM | POA: Diagnosis not present

## 2016-01-24 DIAGNOSIS — E119 Type 2 diabetes mellitus without complications: Secondary | ICD-10-CM | POA: Diagnosis not present

## 2016-01-24 DIAGNOSIS — Z794 Long term (current) use of insulin: Secondary | ICD-10-CM | POA: Diagnosis not present

## 2016-01-26 DIAGNOSIS — Z0000 Encounter for general adult medical examination without abnormal findings: Secondary | ICD-10-CM | POA: Diagnosis not present

## 2016-01-26 DIAGNOSIS — Z9181 History of falling: Secondary | ICD-10-CM | POA: Diagnosis not present

## 2016-01-26 DIAGNOSIS — E669 Obesity, unspecified: Secondary | ICD-10-CM | POA: Diagnosis not present

## 2016-01-26 DIAGNOSIS — N959 Unspecified menopausal and perimenopausal disorder: Secondary | ICD-10-CM | POA: Diagnosis not present

## 2016-01-26 DIAGNOSIS — Z136 Encounter for screening for cardiovascular disorders: Secondary | ICD-10-CM | POA: Diagnosis not present

## 2016-01-26 DIAGNOSIS — Z23 Encounter for immunization: Secondary | ICD-10-CM | POA: Diagnosis not present

## 2016-01-26 DIAGNOSIS — Z1389 Encounter for screening for other disorder: Secondary | ICD-10-CM | POA: Diagnosis not present

## 2016-02-03 DIAGNOSIS — H26491 Other secondary cataract, right eye: Secondary | ICD-10-CM | POA: Diagnosis not present

## 2016-02-03 DIAGNOSIS — E119 Type 2 diabetes mellitus without complications: Secondary | ICD-10-CM | POA: Diagnosis not present

## 2016-02-23 DIAGNOSIS — D649 Anemia, unspecified: Secondary | ICD-10-CM | POA: Diagnosis not present

## 2016-02-24 DIAGNOSIS — R5383 Other fatigue: Secondary | ICD-10-CM | POA: Diagnosis not present

## 2016-02-24 DIAGNOSIS — D509 Iron deficiency anemia, unspecified: Secondary | ICD-10-CM | POA: Diagnosis not present

## 2016-02-27 DIAGNOSIS — Z79899 Other long term (current) drug therapy: Secondary | ICD-10-CM | POA: Diagnosis not present

## 2016-03-19 DIAGNOSIS — E119 Type 2 diabetes mellitus without complications: Secondary | ICD-10-CM | POA: Diagnosis not present

## 2016-03-19 DIAGNOSIS — I10 Essential (primary) hypertension: Secondary | ICD-10-CM | POA: Diagnosis not present

## 2016-03-19 DIAGNOSIS — R072 Precordial pain: Secondary | ICD-10-CM | POA: Diagnosis not present

## 2016-03-19 DIAGNOSIS — I472 Ventricular tachycardia: Secondary | ICD-10-CM | POA: Diagnosis not present

## 2016-03-29 DIAGNOSIS — E785 Hyperlipidemia, unspecified: Secondary | ICD-10-CM | POA: Diagnosis not present

## 2016-03-29 DIAGNOSIS — I10 Essential (primary) hypertension: Secondary | ICD-10-CM | POA: Diagnosis not present

## 2016-03-29 DIAGNOSIS — K219 Gastro-esophageal reflux disease without esophagitis: Secondary | ICD-10-CM | POA: Diagnosis not present

## 2016-03-29 DIAGNOSIS — F329 Major depressive disorder, single episode, unspecified: Secondary | ICD-10-CM | POA: Diagnosis not present

## 2016-03-29 DIAGNOSIS — M79601 Pain in right arm: Secondary | ICD-10-CM | POA: Diagnosis not present

## 2016-03-29 DIAGNOSIS — D539 Nutritional anemia, unspecified: Secondary | ICD-10-CM | POA: Diagnosis not present

## 2016-03-29 DIAGNOSIS — Z79891 Long term (current) use of opiate analgesic: Secondary | ICD-10-CM | POA: Diagnosis not present

## 2016-03-29 DIAGNOSIS — E119 Type 2 diabetes mellitus without complications: Secondary | ICD-10-CM | POA: Diagnosis not present

## 2016-04-30 DIAGNOSIS — R300 Dysuria: Secondary | ICD-10-CM | POA: Diagnosis not present

## 2016-04-30 DIAGNOSIS — R110 Nausea: Secondary | ICD-10-CM | POA: Diagnosis not present

## 2016-04-30 DIAGNOSIS — Z6841 Body Mass Index (BMI) 40.0 and over, adult: Secondary | ICD-10-CM | POA: Diagnosis not present

## 2016-04-30 DIAGNOSIS — I10 Essential (primary) hypertension: Secondary | ICD-10-CM | POA: Diagnosis not present

## 2016-04-30 DIAGNOSIS — R109 Unspecified abdominal pain: Secondary | ICD-10-CM | POA: Diagnosis not present

## 2016-05-02 DIAGNOSIS — D3502 Benign neoplasm of left adrenal gland: Secondary | ICD-10-CM | POA: Diagnosis not present

## 2016-05-02 DIAGNOSIS — I700 Atherosclerosis of aorta: Secondary | ICD-10-CM | POA: Diagnosis not present

## 2016-05-02 DIAGNOSIS — K5730 Diverticulosis of large intestine without perforation or abscess without bleeding: Secondary | ICD-10-CM | POA: Diagnosis not present

## 2016-05-02 DIAGNOSIS — R109 Unspecified abdominal pain: Secondary | ICD-10-CM | POA: Diagnosis not present

## 2016-05-02 DIAGNOSIS — M4856XA Collapsed vertebra, not elsewhere classified, lumbar region, initial encounter for fracture: Secondary | ICD-10-CM | POA: Diagnosis not present

## 2016-06-11 DIAGNOSIS — R635 Abnormal weight gain: Secondary | ICD-10-CM | POA: Diagnosis not present

## 2016-06-11 DIAGNOSIS — Z6841 Body Mass Index (BMI) 40.0 and over, adult: Secondary | ICD-10-CM | POA: Diagnosis not present

## 2016-06-11 DIAGNOSIS — R0600 Dyspnea, unspecified: Secondary | ICD-10-CM | POA: Diagnosis not present

## 2016-06-11 DIAGNOSIS — I10 Essential (primary) hypertension: Secondary | ICD-10-CM | POA: Diagnosis not present

## 2016-06-11 DIAGNOSIS — R5381 Other malaise: Secondary | ICD-10-CM | POA: Diagnosis not present

## 2016-06-11 DIAGNOSIS — R300 Dysuria: Secondary | ICD-10-CM | POA: Diagnosis not present

## 2016-06-12 DIAGNOSIS — R5381 Other malaise: Secondary | ICD-10-CM | POA: Diagnosis not present

## 2016-06-12 DIAGNOSIS — R0600 Dyspnea, unspecified: Secondary | ICD-10-CM | POA: Diagnosis not present

## 2016-06-20 DIAGNOSIS — I472 Ventricular tachycardia: Secondary | ICD-10-CM | POA: Diagnosis not present

## 2016-07-16 DIAGNOSIS — E785 Hyperlipidemia, unspecified: Secondary | ICD-10-CM | POA: Diagnosis not present

## 2016-07-16 DIAGNOSIS — I10 Essential (primary) hypertension: Secondary | ICD-10-CM | POA: Diagnosis not present

## 2016-07-16 DIAGNOSIS — E039 Hypothyroidism, unspecified: Secondary | ICD-10-CM | POA: Diagnosis not present

## 2016-07-16 DIAGNOSIS — E119 Type 2 diabetes mellitus without complications: Secondary | ICD-10-CM | POA: Diagnosis not present

## 2016-07-16 DIAGNOSIS — D539 Nutritional anemia, unspecified: Secondary | ICD-10-CM | POA: Diagnosis not present

## 2016-08-07 DIAGNOSIS — H2512 Age-related nuclear cataract, left eye: Secondary | ICD-10-CM | POA: Diagnosis not present

## 2016-08-21 DIAGNOSIS — H25812 Combined forms of age-related cataract, left eye: Secondary | ICD-10-CM | POA: Diagnosis not present

## 2016-08-21 DIAGNOSIS — H2522 Age-related cataract, morgagnian type, left eye: Secondary | ICD-10-CM | POA: Diagnosis not present

## 2016-08-21 DIAGNOSIS — H578 Other specified disorders of eye and adnexa: Secondary | ICD-10-CM | POA: Diagnosis not present

## 2016-08-21 DIAGNOSIS — H2512 Age-related nuclear cataract, left eye: Secondary | ICD-10-CM | POA: Diagnosis not present

## 2016-09-04 DIAGNOSIS — D509 Iron deficiency anemia, unspecified: Secondary | ICD-10-CM | POA: Diagnosis not present

## 2016-09-06 DIAGNOSIS — D509 Iron deficiency anemia, unspecified: Secondary | ICD-10-CM | POA: Diagnosis not present

## 2016-09-24 DIAGNOSIS — Z6841 Body Mass Index (BMI) 40.0 and over, adult: Secondary | ICD-10-CM | POA: Diagnosis not present

## 2016-09-24 DIAGNOSIS — E119 Type 2 diabetes mellitus without complications: Secondary | ICD-10-CM | POA: Diagnosis not present

## 2016-09-24 DIAGNOSIS — E039 Hypothyroidism, unspecified: Secondary | ICD-10-CM | POA: Diagnosis not present

## 2016-09-24 DIAGNOSIS — M79601 Pain in right arm: Secondary | ICD-10-CM | POA: Diagnosis not present

## 2016-09-24 DIAGNOSIS — R300 Dysuria: Secondary | ICD-10-CM | POA: Diagnosis not present

## 2016-11-26 DIAGNOSIS — E119 Type 2 diabetes mellitus without complications: Secondary | ICD-10-CM | POA: Diagnosis not present

## 2016-11-26 DIAGNOSIS — D539 Nutritional anemia, unspecified: Secondary | ICD-10-CM | POA: Diagnosis not present

## 2016-11-26 DIAGNOSIS — Z1231 Encounter for screening mammogram for malignant neoplasm of breast: Secondary | ICD-10-CM | POA: Diagnosis not present

## 2016-11-26 DIAGNOSIS — I10 Essential (primary) hypertension: Secondary | ICD-10-CM | POA: Diagnosis not present

## 2016-11-26 DIAGNOSIS — M1990 Unspecified osteoarthritis, unspecified site: Secondary | ICD-10-CM | POA: Diagnosis not present

## 2016-11-26 DIAGNOSIS — E039 Hypothyroidism, unspecified: Secondary | ICD-10-CM | POA: Diagnosis not present

## 2016-11-26 DIAGNOSIS — E785 Hyperlipidemia, unspecified: Secondary | ICD-10-CM | POA: Diagnosis not present

## 2016-11-26 DIAGNOSIS — M25561 Pain in right knee: Secondary | ICD-10-CM | POA: Diagnosis not present

## 2016-11-26 DIAGNOSIS — F419 Anxiety disorder, unspecified: Secondary | ICD-10-CM | POA: Diagnosis not present

## 2016-12-04 DIAGNOSIS — M25561 Pain in right knee: Secondary | ICD-10-CM | POA: Diagnosis not present

## 2017-01-07 DIAGNOSIS — Z794 Long term (current) use of insulin: Secondary | ICD-10-CM

## 2017-01-07 DIAGNOSIS — Z79899 Other long term (current) drug therapy: Secondary | ICD-10-CM

## 2017-01-07 HISTORY — DX: Other long term (current) drug therapy: Z79.899

## 2017-01-07 HISTORY — DX: Long term (current) use of insulin: Z79.4

## 2017-01-15 DIAGNOSIS — N390 Urinary tract infection, site not specified: Secondary | ICD-10-CM | POA: Diagnosis not present

## 2017-01-15 DIAGNOSIS — Z1231 Encounter for screening mammogram for malignant neoplasm of breast: Secondary | ICD-10-CM | POA: Diagnosis not present

## 2017-02-11 DIAGNOSIS — Z136 Encounter for screening for cardiovascular disorders: Secondary | ICD-10-CM | POA: Diagnosis not present

## 2017-02-11 DIAGNOSIS — Z1231 Encounter for screening mammogram for malignant neoplasm of breast: Secondary | ICD-10-CM | POA: Diagnosis not present

## 2017-02-11 DIAGNOSIS — E669 Obesity, unspecified: Secondary | ICD-10-CM | POA: Diagnosis not present

## 2017-02-11 DIAGNOSIS — Z1331 Encounter for screening for depression: Secondary | ICD-10-CM | POA: Diagnosis not present

## 2017-02-11 DIAGNOSIS — Z9181 History of falling: Secondary | ICD-10-CM | POA: Diagnosis not present

## 2017-02-11 DIAGNOSIS — N959 Unspecified menopausal and perimenopausal disorder: Secondary | ICD-10-CM | POA: Diagnosis not present

## 2017-02-11 DIAGNOSIS — Z0000 Encounter for general adult medical examination without abnormal findings: Secondary | ICD-10-CM | POA: Diagnosis not present

## 2017-02-11 DIAGNOSIS — E785 Hyperlipidemia, unspecified: Secondary | ICD-10-CM | POA: Diagnosis not present

## 2017-02-11 DIAGNOSIS — Z6841 Body Mass Index (BMI) 40.0 and over, adult: Secondary | ICD-10-CM | POA: Diagnosis not present

## 2017-02-26 DIAGNOSIS — E119 Type 2 diabetes mellitus without complications: Secondary | ICD-10-CM | POA: Diagnosis not present

## 2017-02-26 DIAGNOSIS — Z79891 Long term (current) use of opiate analgesic: Secondary | ICD-10-CM | POA: Diagnosis not present

## 2017-02-26 DIAGNOSIS — Z6841 Body Mass Index (BMI) 40.0 and over, adult: Secondary | ICD-10-CM | POA: Diagnosis not present

## 2017-02-26 DIAGNOSIS — D539 Nutritional anemia, unspecified: Secondary | ICD-10-CM | POA: Diagnosis not present

## 2017-02-26 DIAGNOSIS — E785 Hyperlipidemia, unspecified: Secondary | ICD-10-CM | POA: Diagnosis not present

## 2017-02-26 DIAGNOSIS — Z2821 Immunization not carried out because of patient refusal: Secondary | ICD-10-CM | POA: Diagnosis not present

## 2017-03-05 DIAGNOSIS — D649 Anemia, unspecified: Secondary | ICD-10-CM | POA: Diagnosis not present

## 2017-03-06 DIAGNOSIS — D509 Iron deficiency anemia, unspecified: Secondary | ICD-10-CM | POA: Diagnosis not present

## 2017-03-06 DIAGNOSIS — N189 Chronic kidney disease, unspecified: Secondary | ICD-10-CM | POA: Diagnosis not present

## 2017-03-27 DIAGNOSIS — E113293 Type 2 diabetes mellitus with mild nonproliferative diabetic retinopathy without macular edema, bilateral: Secondary | ICD-10-CM | POA: Diagnosis not present

## 2017-04-26 DIAGNOSIS — I472 Ventricular tachycardia: Secondary | ICD-10-CM | POA: Diagnosis not present

## 2017-04-26 DIAGNOSIS — G4733 Obstructive sleep apnea (adult) (pediatric): Secondary | ICD-10-CM

## 2017-04-26 DIAGNOSIS — G459 Transient cerebral ischemic attack, unspecified: Secondary | ICD-10-CM

## 2017-04-26 DIAGNOSIS — R0602 Shortness of breath: Secondary | ICD-10-CM

## 2017-04-26 DIAGNOSIS — I10 Essential (primary) hypertension: Secondary | ICD-10-CM | POA: Diagnosis not present

## 2017-04-26 DIAGNOSIS — D509 Iron deficiency anemia, unspecified: Secondary | ICD-10-CM | POA: Diagnosis not present

## 2017-04-26 HISTORY — DX: Obstructive sleep apnea (adult) (pediatric): G47.33

## 2017-04-26 HISTORY — DX: Transient cerebral ischemic attack, unspecified: G45.9

## 2017-04-26 HISTORY — DX: Shortness of breath: R06.02

## 2017-05-13 DIAGNOSIS — R0602 Shortness of breath: Secondary | ICD-10-CM | POA: Diagnosis not present

## 2017-06-06 ENCOUNTER — Encounter: Payer: Self-pay | Admitting: Cardiology

## 2017-06-06 DIAGNOSIS — D539 Nutritional anemia, unspecified: Secondary | ICD-10-CM | POA: Diagnosis not present

## 2017-06-06 DIAGNOSIS — E119 Type 2 diabetes mellitus without complications: Secondary | ICD-10-CM | POA: Diagnosis not present

## 2017-06-06 DIAGNOSIS — I10 Essential (primary) hypertension: Secondary | ICD-10-CM | POA: Diagnosis not present

## 2017-06-06 DIAGNOSIS — E785 Hyperlipidemia, unspecified: Secondary | ICD-10-CM | POA: Diagnosis not present

## 2017-06-06 DIAGNOSIS — M1990 Unspecified osteoarthritis, unspecified site: Secondary | ICD-10-CM | POA: Diagnosis not present

## 2017-06-09 DIAGNOSIS — Z87440 Personal history of urinary (tract) infections: Secondary | ICD-10-CM | POA: Diagnosis not present

## 2017-06-09 DIAGNOSIS — I10 Essential (primary) hypertension: Secondary | ICD-10-CM | POA: Diagnosis not present

## 2017-06-09 DIAGNOSIS — M1991 Primary osteoarthritis, unspecified site: Secondary | ICD-10-CM | POA: Diagnosis not present

## 2017-06-09 DIAGNOSIS — D539 Nutritional anemia, unspecified: Secondary | ICD-10-CM | POA: Diagnosis not present

## 2017-06-09 DIAGNOSIS — Z79891 Long term (current) use of opiate analgesic: Secondary | ICD-10-CM | POA: Diagnosis not present

## 2017-06-09 DIAGNOSIS — Z7984 Long term (current) use of oral hypoglycemic drugs: Secondary | ICD-10-CM | POA: Diagnosis not present

## 2017-06-09 DIAGNOSIS — F419 Anxiety disorder, unspecified: Secondary | ICD-10-CM | POA: Diagnosis not present

## 2017-06-09 DIAGNOSIS — K219 Gastro-esophageal reflux disease without esophagitis: Secondary | ICD-10-CM | POA: Diagnosis not present

## 2017-06-09 DIAGNOSIS — Z9181 History of falling: Secondary | ICD-10-CM | POA: Diagnosis not present

## 2017-06-09 DIAGNOSIS — F329 Major depressive disorder, single episode, unspecified: Secondary | ICD-10-CM | POA: Diagnosis not present

## 2017-06-09 DIAGNOSIS — Z8673 Personal history of transient ischemic attack (TIA), and cerebral infarction without residual deficits: Secondary | ICD-10-CM | POA: Diagnosis not present

## 2017-06-09 DIAGNOSIS — Z6841 Body Mass Index (BMI) 40.0 and over, adult: Secondary | ICD-10-CM | POA: Diagnosis not present

## 2017-06-09 DIAGNOSIS — Z7902 Long term (current) use of antithrombotics/antiplatelets: Secondary | ICD-10-CM | POA: Diagnosis not present

## 2017-06-09 DIAGNOSIS — K589 Irritable bowel syndrome without diarrhea: Secondary | ICD-10-CM | POA: Diagnosis not present

## 2017-06-09 DIAGNOSIS — E119 Type 2 diabetes mellitus without complications: Secondary | ICD-10-CM | POA: Diagnosis not present

## 2017-06-11 DIAGNOSIS — D539 Nutritional anemia, unspecified: Secondary | ICD-10-CM | POA: Diagnosis not present

## 2017-06-11 DIAGNOSIS — M1991 Primary osteoarthritis, unspecified site: Secondary | ICD-10-CM | POA: Diagnosis not present

## 2017-06-11 DIAGNOSIS — K219 Gastro-esophageal reflux disease without esophagitis: Secondary | ICD-10-CM | POA: Diagnosis not present

## 2017-06-11 DIAGNOSIS — F419 Anxiety disorder, unspecified: Secondary | ICD-10-CM | POA: Diagnosis not present

## 2017-06-11 DIAGNOSIS — I10 Essential (primary) hypertension: Secondary | ICD-10-CM | POA: Diagnosis not present

## 2017-06-11 DIAGNOSIS — E119 Type 2 diabetes mellitus without complications: Secondary | ICD-10-CM | POA: Diagnosis not present

## 2017-06-13 DIAGNOSIS — I10 Essential (primary) hypertension: Secondary | ICD-10-CM | POA: Diagnosis not present

## 2017-06-13 DIAGNOSIS — E119 Type 2 diabetes mellitus without complications: Secondary | ICD-10-CM | POA: Diagnosis not present

## 2017-06-13 DIAGNOSIS — M1991 Primary osteoarthritis, unspecified site: Secondary | ICD-10-CM | POA: Diagnosis not present

## 2017-06-13 DIAGNOSIS — K219 Gastro-esophageal reflux disease without esophagitis: Secondary | ICD-10-CM | POA: Diagnosis not present

## 2017-06-13 DIAGNOSIS — F419 Anxiety disorder, unspecified: Secondary | ICD-10-CM | POA: Diagnosis not present

## 2017-06-13 DIAGNOSIS — D539 Nutritional anemia, unspecified: Secondary | ICD-10-CM | POA: Diagnosis not present

## 2017-06-18 DIAGNOSIS — F419 Anxiety disorder, unspecified: Secondary | ICD-10-CM | POA: Diagnosis not present

## 2017-06-18 DIAGNOSIS — M1991 Primary osteoarthritis, unspecified site: Secondary | ICD-10-CM | POA: Diagnosis not present

## 2017-06-18 DIAGNOSIS — D539 Nutritional anemia, unspecified: Secondary | ICD-10-CM | POA: Diagnosis not present

## 2017-06-18 DIAGNOSIS — K219 Gastro-esophageal reflux disease without esophagitis: Secondary | ICD-10-CM | POA: Diagnosis not present

## 2017-06-18 DIAGNOSIS — I10 Essential (primary) hypertension: Secondary | ICD-10-CM | POA: Diagnosis not present

## 2017-06-18 DIAGNOSIS — E119 Type 2 diabetes mellitus without complications: Secondary | ICD-10-CM | POA: Diagnosis not present

## 2017-06-20 DIAGNOSIS — D539 Nutritional anemia, unspecified: Secondary | ICD-10-CM | POA: Diagnosis not present

## 2017-06-20 DIAGNOSIS — K219 Gastro-esophageal reflux disease without esophagitis: Secondary | ICD-10-CM | POA: Diagnosis not present

## 2017-06-20 DIAGNOSIS — I10 Essential (primary) hypertension: Secondary | ICD-10-CM | POA: Diagnosis not present

## 2017-06-20 DIAGNOSIS — M1991 Primary osteoarthritis, unspecified site: Secondary | ICD-10-CM | POA: Diagnosis not present

## 2017-06-20 DIAGNOSIS — E119 Type 2 diabetes mellitus without complications: Secondary | ICD-10-CM | POA: Diagnosis not present

## 2017-06-20 DIAGNOSIS — F419 Anxiety disorder, unspecified: Secondary | ICD-10-CM | POA: Diagnosis not present

## 2017-06-24 DIAGNOSIS — F419 Anxiety disorder, unspecified: Secondary | ICD-10-CM | POA: Diagnosis not present

## 2017-06-24 DIAGNOSIS — K219 Gastro-esophageal reflux disease without esophagitis: Secondary | ICD-10-CM | POA: Diagnosis not present

## 2017-06-24 DIAGNOSIS — I10 Essential (primary) hypertension: Secondary | ICD-10-CM | POA: Diagnosis not present

## 2017-06-24 DIAGNOSIS — D539 Nutritional anemia, unspecified: Secondary | ICD-10-CM | POA: Diagnosis not present

## 2017-06-24 DIAGNOSIS — M1991 Primary osteoarthritis, unspecified site: Secondary | ICD-10-CM | POA: Diagnosis not present

## 2017-06-24 DIAGNOSIS — E119 Type 2 diabetes mellitus without complications: Secondary | ICD-10-CM | POA: Diagnosis not present

## 2017-06-27 DIAGNOSIS — K219 Gastro-esophageal reflux disease without esophagitis: Secondary | ICD-10-CM | POA: Diagnosis not present

## 2017-06-27 DIAGNOSIS — M1991 Primary osteoarthritis, unspecified site: Secondary | ICD-10-CM | POA: Diagnosis not present

## 2017-06-27 DIAGNOSIS — F419 Anxiety disorder, unspecified: Secondary | ICD-10-CM | POA: Diagnosis not present

## 2017-06-27 DIAGNOSIS — E119 Type 2 diabetes mellitus without complications: Secondary | ICD-10-CM | POA: Diagnosis not present

## 2017-06-27 DIAGNOSIS — I10 Essential (primary) hypertension: Secondary | ICD-10-CM | POA: Diagnosis not present

## 2017-06-27 DIAGNOSIS — D539 Nutritional anemia, unspecified: Secondary | ICD-10-CM | POA: Diagnosis not present

## 2017-07-02 ENCOUNTER — Encounter: Payer: Self-pay | Admitting: Cardiology

## 2017-07-02 ENCOUNTER — Ambulatory Visit (INDEPENDENT_AMBULATORY_CARE_PROVIDER_SITE_OTHER): Payer: Medicare Other | Admitting: Cardiology

## 2017-07-02 VITALS — BP 130/70 | HR 57 | Ht 65.0 in | Wt 298.0 lb

## 2017-07-02 DIAGNOSIS — G4733 Obstructive sleep apnea (adult) (pediatric): Secondary | ICD-10-CM | POA: Diagnosis not present

## 2017-07-02 DIAGNOSIS — I472 Ventricular tachycardia, unspecified: Secondary | ICD-10-CM

## 2017-07-02 DIAGNOSIS — R072 Precordial pain: Secondary | ICD-10-CM | POA: Diagnosis not present

## 2017-07-02 DIAGNOSIS — R0602 Shortness of breath: Secondary | ICD-10-CM

## 2017-07-02 DIAGNOSIS — I4729 Other ventricular tachycardia: Secondary | ICD-10-CM

## 2017-07-02 DIAGNOSIS — I10 Essential (primary) hypertension: Secondary | ICD-10-CM | POA: Diagnosis not present

## 2017-07-02 NOTE — Patient Instructions (Signed)
Medication Instructions:  Your physician recommends that you continue on your current medications as directed. Please refer to the Current Medication list given to you today.  Labwork: None  Testing/Procedures: None  Follow-Up: Your physician recommends that you schedule a follow-up appointment in: 5 months  Any Other Special Instructions Will Be Listed Below (If Applicable).     If you need a refill on your cardiac medications before your next appointment, please call your pharmacy.   [facility]  Ashley A, RN, BSN  

## 2017-07-02 NOTE — Progress Notes (Signed)
Cardiology Office Note:    Date:  07/02/2017   ID:  Vickie Ferguson, DOB 1955-11-18, MRN [id number]  PCP:  Nicoletta Dress, MD  Cardiologist:  Jenne Campus, MD    Referring MD: Nicoletta Dress, MD   No chief complaint on file. Doing well described to have weakness and fatigue  History of Present Illness:    Vickie Ferguson is a 62 y.o. female with past medical history significant for palpitations also atypical chest pain.  Morbid obesity.  Overall she seems to be doing well denies having any palpitations no dizziness no passing out.  Recently she had echocardiogram done in May which showed preserved left ventricular ejection fraction.  There was no significant valvular pathology.  Overall she is doing well.  History reviewed. No pertinent past medical history.  Past Surgical History:  Procedure Laterality Date  . CARDIAC CATHETERIZATION      Current Medications: Current Meds  Medication Sig  . ALPRAZolam (XANAX) 1 MG tablet Take 1 mg by mouth every 8 (eight) hours as needed. For anxiety   . atorvastatin (LIPITOR) 40 MG tablet Take 40 mg by mouth at bedtime.    . clopidogrel (PLAVIX) 75 MG tablet Take 1 tablet by mouth daily.  Marland Kitchen dexlansoprazole (DEXILANT) 60 MG capsule Take 1 tablet by mouth daily.  Marland Kitchen diltiazem (CARDIZEM CD) 120 MG 24 hr capsule Take 120 mg by mouth daily.    . fluticasone (FLONASE) 50 MCG/ACT nasal spray Place 2 sprays into the nose daily as needed. For allergies   . furosemide (LASIX) 40 MG tablet Take 40 mg by mouth 2 (two) times daily.    . hydroxypropyl methylcellulose (ISOPTO TEARS) 2.5 % ophthalmic solution Place 2 drops into both eyes 3 (three) times daily as needed. For dry eyes   . levothyroxine (SYNTHROID, LEVOTHROID) 75 MCG tablet Take 1 tablet by mouth daily.  . metFORMIN (GLUCOPHAGE) 500 MG tablet Take 500 mg by mouth 3 (three) times daily.    . metoprolol (LOPRESSOR) 50 MG tablet Take 75 mg by mouth 2 (two) times daily.    . nitroGLYCERIN  (NITROSTAT) 0.4 MG SL tablet Place 1 tablet under the tongue every 5 (five) minutes as needed.  . ondansetron (ZOFRAN) 4 MG tablet Take 4 mg by mouth 3 (three) times daily as needed. For nausea   . oxyCODONE-acetaminophen (PERCOCET) 5-325 MG per tablet Take 2 tablets by mouth every 4 (four) hours as needed. For pain   . pioglitazone (ACTOS) 45 MG tablet Take 45 mg by mouth daily.    . polyethylene glycol (MIRALAX / GLYCOLAX) packet Take 17 g by mouth every other day.       Allergies:   Propafenone; Codeine; Doxycycline; Isosorbide nitrate; Other; Procainamide; Rythmol [propafenone hcl]; and Sertraline   Social History   Socioeconomic History  . Marital status: Legally Separated    Spouse name: Not on file  . Number of children: Not on file  . Years of education: Not on file  . Highest education level: Not on file  Occupational History  . Not on file  Social Needs  . Financial resource strain: Not on file  . Food insecurity:    Worry: Not on file    Inability: Not on file  . Transportation needs:    Medical: Not on file    Non-medical: Not on file  Tobacco Use  . Smoking status: Never Smoker  . Smokeless tobacco: Never Used  Substance and Sexual Activity  . Alcohol use:  No    Alcohol/week: 0.0 oz  . Drug use: No  . Sexual activity: Not on file  Lifestyle  . Physical activity:    Days per week: Not on file    Minutes per session: Not on file  . Stress: Not on file  Relationships  . Social connections:    Talks on phone: Not on file    Gets together: Not on file    Attends religious service: Not on file    Active member of club or organization: Not on file    Attends meetings of clubs or organizations: Not on file    Relationship status: Not on file  Other Topics Concern  . Not on file  Social History Narrative  . Not on file     Family History: [name] family history includes Heart disease in her mother. ROS:   Please see the history of present illness.      All 14 point review of systems negative except as described per history of present illness  EKGs/Labs/Other Studies Reviewed:     EKG shows sinus bradycardia rate of 57, right bundle branch block, criteria for left ventricular hypertrophy.  There is a T inversion in lead V1 V2.   Echocardiogram from May 2019: There is mild aortic sclerosis noted, with no evidence of stenosis. Trace aortic regurgitation. Tricuspid valve is structurally normal. Mild tricuspid regurgitation. RVSP 35 mm Hg. Normal left ventricular systolic function Ejection fraction is visually estimated at 55-60% Mild septal left ventricular hypertrophy  Recent Labs: No results found for requested labs within last 8760 hours.  Recent Lipid Panel No results found for: CHOL, TRIG, HDL, CHOLHDL, VLDL, LDLCALC, LDLDIRECT  Physical Exam:    VS:  Ht $R'5\' 5"'pg$  (1.651 m)   Wt 298 lb (135.2 kg)   BMI 49.59 kg/m     Wt Readings from Last 3 Encounters:  07/02/17 298 lb (135.2 kg)  11/14/15 258 lb (117 kg)  10/31/10 (!) 364 lb 13.8 oz (165.5 kg)     GEN:  Well nourished, well developed in no acute distress HEENT: Normal NECK: No JVD; No carotid bruits LYMPHATICS: No lymphadenopathy CARDIAC: RRR, n systolic murmur best heard right upper portion of the sternum grade 1/6 s, no rubs, no gallops RESPIRATORY:  Clear to auscultation without rales, wheezing or rhonchi  ABDOMEN: Soft, non-tender, non-distended MUSCULOSKELETAL:  No edema; No deformity  SKIN: Warm and dry LOWER EXTREMITIES: no swelling NEUROLOGIC:  Alert and oriented x 3 PSYCHIATRIC:  Normal affect   ASSESSMENT:    1. Shortness of breath   2. Precordial pain   3. Essential hypertension   4. Ventricular tachycardia (paroxysmal) ([place])   5. OSA (obstructive sleep apnea)    PLAN:    In order of problems listed above:  1. Shortness of breath: As usual.  She decided to participate in rehab program which I congratulated her for it and strongly encouraged to  continue. 2. Precordial chest pain: Denies having any. 3. Essential hypertension blood pressure seems to be well controlled continue present management. 4. Ventricular tachycardia: No palpitations no dizziness we will continue monitoring echocardiogram showed preserved left ventricular ejection fraction. 5. Obstructive sleep apnea followed by internal medicine team.   Medication Adjustments/Labs and Tests Ordered: Current medicines are reviewed at length with the patient today.  Concerns regarding medicines are outlined above.  No orders of the defined types were placed in this encounter.  Medication changes: No orders of the defined types were placed in this  encounter.   Signed, Park Liter, MD, [facility] 07/02/2017 9:30 AM    [employer]

## 2017-07-03 DIAGNOSIS — D539 Nutritional anemia, unspecified: Secondary | ICD-10-CM | POA: Diagnosis not present

## 2017-07-03 DIAGNOSIS — I10 Essential (primary) hypertension: Secondary | ICD-10-CM | POA: Diagnosis not present

## 2017-07-03 DIAGNOSIS — E119 Type 2 diabetes mellitus without complications: Secondary | ICD-10-CM | POA: Diagnosis not present

## 2017-07-03 DIAGNOSIS — M1991 Primary osteoarthritis, unspecified site: Secondary | ICD-10-CM | POA: Diagnosis not present

## 2017-07-03 DIAGNOSIS — F419 Anxiety disorder, unspecified: Secondary | ICD-10-CM | POA: Diagnosis not present

## 2017-07-03 DIAGNOSIS — K219 Gastro-esophageal reflux disease without esophagitis: Secondary | ICD-10-CM | POA: Diagnosis not present

## 2017-07-05 DIAGNOSIS — F419 Anxiety disorder, unspecified: Secondary | ICD-10-CM | POA: Diagnosis not present

## 2017-07-05 DIAGNOSIS — K219 Gastro-esophageal reflux disease without esophagitis: Secondary | ICD-10-CM | POA: Diagnosis not present

## 2017-07-05 DIAGNOSIS — M1991 Primary osteoarthritis, unspecified site: Secondary | ICD-10-CM | POA: Diagnosis not present

## 2017-07-05 DIAGNOSIS — D539 Nutritional anemia, unspecified: Secondary | ICD-10-CM | POA: Diagnosis not present

## 2017-07-05 DIAGNOSIS — E119 Type 2 diabetes mellitus without complications: Secondary | ICD-10-CM | POA: Diagnosis not present

## 2017-07-05 DIAGNOSIS — I10 Essential (primary) hypertension: Secondary | ICD-10-CM | POA: Diagnosis not present

## 2017-07-10 DIAGNOSIS — K219 Gastro-esophageal reflux disease without esophagitis: Secondary | ICD-10-CM | POA: Diagnosis not present

## 2017-07-10 DIAGNOSIS — M1991 Primary osteoarthritis, unspecified site: Secondary | ICD-10-CM | POA: Diagnosis not present

## 2017-07-10 DIAGNOSIS — E119 Type 2 diabetes mellitus without complications: Secondary | ICD-10-CM | POA: Diagnosis not present

## 2017-07-10 DIAGNOSIS — I10 Essential (primary) hypertension: Secondary | ICD-10-CM | POA: Diagnosis not present

## 2017-07-10 DIAGNOSIS — F419 Anxiety disorder, unspecified: Secondary | ICD-10-CM | POA: Diagnosis not present

## 2017-07-10 DIAGNOSIS — D539 Nutritional anemia, unspecified: Secondary | ICD-10-CM | POA: Diagnosis not present

## 2017-07-17 DIAGNOSIS — N390 Urinary tract infection, site not specified: Secondary | ICD-10-CM | POA: Diagnosis not present

## 2017-08-19 DIAGNOSIS — R002 Palpitations: Secondary | ICD-10-CM | POA: Diagnosis not present

## 2017-08-19 DIAGNOSIS — N390 Urinary tract infection, site not specified: Secondary | ICD-10-CM | POA: Diagnosis not present

## 2017-08-19 DIAGNOSIS — R300 Dysuria: Secondary | ICD-10-CM | POA: Diagnosis not present

## 2017-09-06 DIAGNOSIS — E538 Deficiency of other specified B group vitamins: Secondary | ICD-10-CM | POA: Diagnosis not present

## 2017-09-06 DIAGNOSIS — N189 Chronic kidney disease, unspecified: Secondary | ICD-10-CM | POA: Diagnosis not present

## 2017-09-06 DIAGNOSIS — R319 Hematuria, unspecified: Secondary | ICD-10-CM | POA: Diagnosis not present

## 2017-09-06 DIAGNOSIS — D509 Iron deficiency anemia, unspecified: Secondary | ICD-10-CM | POA: Diagnosis not present

## 2017-09-10 DIAGNOSIS — I10 Essential (primary) hypertension: Secondary | ICD-10-CM | POA: Diagnosis not present

## 2017-09-10 DIAGNOSIS — Z1339 Encounter for screening examination for other mental health and behavioral disorders: Secondary | ICD-10-CM | POA: Diagnosis not present

## 2017-09-10 DIAGNOSIS — E785 Hyperlipidemia, unspecified: Secondary | ICD-10-CM | POA: Diagnosis not present

## 2017-09-10 DIAGNOSIS — E119 Type 2 diabetes mellitus without complications: Secondary | ICD-10-CM | POA: Diagnosis not present

## 2017-09-10 DIAGNOSIS — R310 Gross hematuria: Secondary | ICD-10-CM | POA: Diagnosis not present

## 2017-10-04 DIAGNOSIS — N390 Urinary tract infection, site not specified: Secondary | ICD-10-CM | POA: Diagnosis not present

## 2017-10-04 DIAGNOSIS — Z79899 Other long term (current) drug therapy: Secondary | ICD-10-CM | POA: Diagnosis not present

## 2017-10-04 DIAGNOSIS — R319 Hematuria, unspecified: Secondary | ICD-10-CM | POA: Diagnosis not present

## 2017-10-04 DIAGNOSIS — R310 Gross hematuria: Secondary | ICD-10-CM | POA: Diagnosis not present

## 2017-10-04 DIAGNOSIS — N952 Postmenopausal atrophic vaginitis: Secondary | ICD-10-CM | POA: Diagnosis not present

## 2017-11-05 DIAGNOSIS — E113293 Type 2 diabetes mellitus with mild nonproliferative diabetic retinopathy without macular edema, bilateral: Secondary | ICD-10-CM | POA: Diagnosis not present

## 2017-11-15 DIAGNOSIS — R3129 Other microscopic hematuria: Secondary | ICD-10-CM | POA: Diagnosis not present

## 2017-11-15 DIAGNOSIS — N390 Urinary tract infection, site not specified: Secondary | ICD-10-CM | POA: Diagnosis not present

## 2017-12-12 DIAGNOSIS — E785 Hyperlipidemia, unspecified: Secondary | ICD-10-CM | POA: Diagnosis not present

## 2017-12-12 DIAGNOSIS — E039 Hypothyroidism, unspecified: Secondary | ICD-10-CM | POA: Diagnosis not present

## 2017-12-12 DIAGNOSIS — D539 Nutritional anemia, unspecified: Secondary | ICD-10-CM | POA: Diagnosis not present

## 2017-12-12 DIAGNOSIS — E119 Type 2 diabetes mellitus without complications: Secondary | ICD-10-CM | POA: Diagnosis not present

## 2017-12-12 DIAGNOSIS — I10 Essential (primary) hypertension: Secondary | ICD-10-CM | POA: Diagnosis not present

## 2017-12-13 ENCOUNTER — Encounter: Payer: Self-pay | Admitting: Cardiology

## 2017-12-13 ENCOUNTER — Ambulatory Visit (INDEPENDENT_AMBULATORY_CARE_PROVIDER_SITE_OTHER): Payer: Medicare Other | Admitting: Cardiology

## 2017-12-13 VITALS — BP 118/68 | HR 64 | Ht 63.0 in | Wt 295.0 lb

## 2017-12-13 DIAGNOSIS — I472 Ventricular tachycardia: Secondary | ICD-10-CM

## 2017-12-13 DIAGNOSIS — Z794 Long term (current) use of insulin: Secondary | ICD-10-CM | POA: Diagnosis not present

## 2017-12-13 DIAGNOSIS — I10 Essential (primary) hypertension: Secondary | ICD-10-CM | POA: Diagnosis not present

## 2017-12-13 DIAGNOSIS — I4729 Other ventricular tachycardia: Secondary | ICD-10-CM

## 2017-12-13 DIAGNOSIS — R0602 Shortness of breath: Secondary | ICD-10-CM | POA: Diagnosis not present

## 2017-12-13 DIAGNOSIS — G4733 Obstructive sleep apnea (adult) (pediatric): Secondary | ICD-10-CM

## 2017-12-13 MED ORDER — NITROGLYCERIN 0.4 MG SL SUBL: 0.4000 mg | SUBLINGUAL_TABLET | SUBLINGUAL | 6 refills | Status: DC | PRN

## 2017-12-13 NOTE — Progress Notes (Signed)
Cardiology Office Note:    Date:  12/13/2017   ID:  [name], DOB 08/04/1955, MRN [id number]  PCP:  Nicoletta Dress, MD  Cardiologist:  Jenne Campus, MD    Referring MD: Nicoletta Dress, MD   Chief Complaint  Patient presents with  . Follow-up  Doing well  History of Present Illness:    Vickie Ferguson is a 62 y.o. female with morbid obesity, diabetes, hypothyroidism, kidney failure, history of ventricular tachycardia.  Comes to the office for follow-up doing well denies of any chest pain tightness squeezing pressure pain chest to be discomfort 7 she has his defibrillator creatinine is elevated.  No past medical history on file.  Past Surgical History:  Procedure Laterality Date  . CARDIAC CATHETERIZATION      Current Medications: Current Meds  Medication Sig  . ALPRAZolam (XANAX) 1 MG tablet Take 1 mg by mouth every 8 (eight) hours as needed. For anxiety   . atorvastatin (LIPITOR) 40 MG tablet Take 40 mg by mouth at bedtime.    . clopidogrel (PLAVIX) 75 MG tablet Take 1 tablet by mouth daily.  Marland Kitchen dexlansoprazole (DEXILANT) 60 MG capsule Take 1 tablet by mouth daily.  Marland Kitchen diltiazem (CARDIZEM CD) 120 MG 24 hr capsule Take 120 mg by mouth daily.    . fluticasone (FLONASE) 50 MCG/ACT nasal spray Place 2 sprays into the nose daily as needed. For allergies   . furosemide (LASIX) 40 MG tablet Take 40 mg by mouth 2 (two) times daily.    . hydroxypropyl methylcellulose (ISOPTO TEARS) 2.5 % ophthalmic solution Place 2 drops into both eyes 3 (three) times daily as needed. For dry eyes   . levothyroxine (SYNTHROID, LEVOTHROID) 75 MCG tablet Take 1 tablet by mouth daily.  . metFORMIN (GLUCOPHAGE) 500 MG tablet Take 500 mg by mouth 3 (three) times daily.    . metoprolol (LOPRESSOR) 50 MG tablet Take 50 mg by mouth 2 (two) times daily.   . nitroGLYCERIN (NITROSTAT) 0.4 MG SL tablet Place 1 tablet (0.4 mg total) under the tongue every 5 (five) minutes as needed.  . ondansetron  (ZOFRAN) 4 MG tablet Take 4 mg by mouth 3 (three) times daily as needed. For nausea   . Oxycodone HCl 10 MG TABS Take 10 mg by mouth every 4 (four) hours as needed.  . pioglitazone (ACTOS) 45 MG tablet Take 45 mg by mouth daily.    . polyethylene glycol (MIRALAX / GLYCOLAX) packet Take 17 g by mouth every other day.    . [DISCONTINUED] nitroGLYCERIN (NITROSTAT) 0.4 MG SL tablet Place 1 tablet under the tongue every 5 (five) minutes as needed.     Allergies:   Propafenone; Codeine; Doxycycline; Isosorbide nitrate; Other; Procainamide; Rythmol [propafenone hcl]; and Sertraline   Social History   Socioeconomic History  . Marital status: Legally Separated    Spouse name: Not on file  . Number of children: Not on file  . Years of education: Not on file  . Highest education level: Not on file  Occupational History  . Not on file  Social Needs  . Financial resource strain: Not on file  . Food insecurity:    Worry: Not on file    Inability: Not on file  . Transportation needs:    Medical: Not on file    Non-medical: Not on file  Tobacco Use  . Smoking status: Never Smoker  . Smokeless tobacco: Never Used  Substance and Sexual Activity  . Alcohol use: No  Alcohol/week: 0.0 standard drinks  . Drug use: No  . Sexual activity: Not on file  Lifestyle  . Physical activity:    Days per week: Not on file    Minutes per session: Not on file  . Stress: Not on file  Relationships  . Social connections:    Talks on phone: Not on file    Gets together: Not on file    Attends religious service: Not on file    Active member of club or organization: Not on file    Attends meetings of clubs or organizations: Not on file    Relationship status: Not on file  Other Topics Concern  . Not on file  Social History Narrative  . Not on file     Family History: [name] family history includes Heart disease in her mother. ROS:   Please see the history of present illness.    All 14 point  review of systems negative except as described per history of present illness  EKGs/Labs/Other Studies Reviewed:      Recent Labs: No results found for requested labs within last 8760 hours.  Recent Lipid Panel No results found for: CHOL, TRIG, HDL, CHOLHDL, VLDL, LDLCALC, LDLDIRECT  Physical Exam:    VS:  BP 118/68   Pulse 64   Ht $R'5\' 3"'vE$  (1.6 m)   Wt 295 lb (133.8 kg)   SpO2 98%   BMI 52.26 kg/m     Wt Readings from Last 3 Encounters:  12/13/17 295 lb (133.8 kg)  07/02/17 298 lb (135.2 kg)  11/14/15 258 lb (117 kg)     GEN:  Well nourished, well developed in no acute distress HEENT: Normal NECK: No JVD; No carotid bruits LYMPHATICS: No lymphadenopathy CARDIAC: RRR, no murmurs, no rubs, no gallops RESPIRATORY:  Clear to auscultation without rales, wheezing or rhonchi  ABDOMEN: Soft, non-tender, non-distended MUSCULOSKELETAL:  No edema; No deformity  SKIN: Warm and dry LOWER EXTREMITIES: no swelling NEUROLOGIC:  Alert and oriented x 3 PSYCHIATRIC:  Normal affect   ASSESSMENT:    1. Essential hypertension   2. Ventricular tachycardia (paroxysmal) ([place])   3. OSA (obstructive sleep apnea)   4. Long term current use of insulin ([place])   5. Morbid (severe) obesity due to excess calories ([place])   6. Shortness of breath    PLAN:    In order of problems listed above:  1. Essential hypertension blood pressure well controlled continue present management. 2. History of ventricle tachycardia denies having any dizziness passing out palpitations. 3. Obstructive sleep apnea managed by internal medicine team. 4. Long-term insulin followed by internal medicine team. 5. Morbid obesity: Aware of it trying to work on weight loss.  However cardiac wise doing well I will see him back in 3 months   Medication Adjustments/Labs and Tests Ordered: Current medicines are reviewed at length with the patient today.  Concerns regarding medicines are outlined above.  No orders of the  defined types were placed in this encounter.  Medication changes:  Meds ordered this encounter  Medications  . nitroGLYCERIN (NITROSTAT) 0.4 MG SL tablet    Sig: Place 1 tablet (0.4 mg total) under the tongue every 5 (five) minutes as needed.    Dispense:  25 tablet    Refill:  6    Signed, Park Liter, MD, [facility] 12/13/2017 9:45 AM    [place]

## 2017-12-13 NOTE — Patient Instructions (Signed)
Medication Instructions:  Your physician recommends that you continue on your current medications as directed. Please refer to the Current Medication list given to you today.  If you need a refill on your cardiac medications before your next appointment, please call your pharmacy.   Lab work: None.  If you have labs (blood work) drawn today and your tests are completely normal, you will receive your results only by: . MyChart Message (if you have MyChart) OR . A paper copy in the mail If you have any lab test that is abnormal or we need to change your treatment, we will call you to review the results.  Testing/Procedures: None.   Follow-Up: At [facility], you and your health needs are our priority.  As part of our continuing mission to provide you with exceptional heart care, we have created designated Provider Care Teams.  These Care Teams include your primary Cardiologist (physician) and Advanced Practice Providers (APPs -  Physician Assistants and Nurse Practitioners) who all work together to provide you with the care you need, when you need it. You will need a follow up appointment in 5 months.  Please call our office 2 months in advance to schedule this appointment.  You may see No primary care provider on file. or another member of our [facility] Provider Team in [place]: Brian Munley, MD . Rajan Revankar, MD  Any Other Special Instructions Will Be Listed Below (If Applicable).    

## 2018-01-27 ENCOUNTER — Telehealth: Payer: Self-pay | Admitting: Cardiology

## 2018-01-27 MED ORDER — METOPROLOL TARTRATE 50 MG PO TABS: 50.0000 mg | ORAL_TABLET | Freq: Two times a day (BID) | ORAL | 1 refills | Status: DC

## 2018-01-27 MED ORDER — DILTIAZEM HCL ER COATED BEADS 120 MG PO CP24: 120.0000 mg | ORAL_CAPSULE | Freq: Every day | ORAL | 1 refills | Status: DC

## 2018-01-27 NOTE — Telephone Encounter (Signed)
Cardizem 120 mg daily refilled and metoprolol tartrate 50 mg twice daily refilled

## 2018-01-27 NOTE — Telephone Encounter (Signed)
°  Medication running low  1. Which medications need to be refilled? (please list name of each medication and dose if known) cardiazem 120mg  once daily, metoprolol tartrate 50mg  twice daily  2. Which pharmacy/location (including street and city if local pharmacy) is medication to be sent to? [facility] on [address] [place]  3. Do they need a 30 day or 90 day supply? [place]

## 2018-01-31 DIAGNOSIS — Z23 Encounter for immunization: Secondary | ICD-10-CM | POA: Diagnosis not present

## 2018-02-13 DIAGNOSIS — Z0000 Encounter for general adult medical examination without abnormal findings: Secondary | ICD-10-CM | POA: Diagnosis not present

## 2018-02-13 DIAGNOSIS — E669 Obesity, unspecified: Secondary | ICD-10-CM | POA: Diagnosis not present

## 2018-02-13 DIAGNOSIS — Z1231 Encounter for screening mammogram for malignant neoplasm of breast: Secondary | ICD-10-CM | POA: Diagnosis not present

## 2018-02-13 DIAGNOSIS — Z136 Encounter for screening for cardiovascular disorders: Secondary | ICD-10-CM | POA: Diagnosis not present

## 2018-02-13 DIAGNOSIS — Z1331 Encounter for screening for depression: Secondary | ICD-10-CM | POA: Diagnosis not present

## 2018-02-13 DIAGNOSIS — N959 Unspecified menopausal and perimenopausal disorder: Secondary | ICD-10-CM | POA: Diagnosis not present

## 2018-02-13 DIAGNOSIS — Z9181 History of falling: Secondary | ICD-10-CM | POA: Diagnosis not present

## 2018-02-13 DIAGNOSIS — E785 Hyperlipidemia, unspecified: Secondary | ICD-10-CM | POA: Diagnosis not present

## 2018-03-07 DIAGNOSIS — D509 Iron deficiency anemia, unspecified: Secondary | ICD-10-CM | POA: Diagnosis not present

## 2018-03-07 DIAGNOSIS — N189 Chronic kidney disease, unspecified: Secondary | ICD-10-CM | POA: Diagnosis not present

## 2018-03-07 DIAGNOSIS — D631 Anemia in chronic kidney disease: Secondary | ICD-10-CM | POA: Diagnosis not present

## 2018-03-11 DIAGNOSIS — Z1231 Encounter for screening mammogram for malignant neoplasm of breast: Secondary | ICD-10-CM | POA: Diagnosis not present

## 2018-03-18 DIAGNOSIS — I10 Essential (primary) hypertension: Secondary | ICD-10-CM | POA: Diagnosis not present

## 2018-03-18 DIAGNOSIS — E785 Hyperlipidemia, unspecified: Secondary | ICD-10-CM | POA: Diagnosis not present

## 2018-03-18 DIAGNOSIS — E039 Hypothyroidism, unspecified: Secondary | ICD-10-CM | POA: Diagnosis not present

## 2018-03-18 DIAGNOSIS — E1122 Type 2 diabetes mellitus with diabetic chronic kidney disease: Secondary | ICD-10-CM | POA: Diagnosis not present

## 2018-03-18 DIAGNOSIS — Z79891 Long term (current) use of opiate analgesic: Secondary | ICD-10-CM | POA: Diagnosis not present

## 2018-04-22 ENCOUNTER — Telehealth: Payer: Self-pay | Admitting: Cardiology

## 2018-04-22 NOTE — Telephone Encounter (Signed)
Virtual Visit Pre-Appointment Phone Call  "(Name), I am calling you today to discuss your upcoming appointment. We are currently trying to limit exposure to the virus that causes COVID-19 by seeing patients at home rather than in the office."  1. "What is the BEST phone number to call the day of the visit?" - include this in appointment notes  2. Do you have or have access to (through a family member/friend) a smartphone with video capability that we can use for your visit?" a. If yes - list this number in appt notes as cell (if different from BEST phone #) and list the appointment type as a VIDEO visit in appointment notes b. If no - list the appointment type as a PHONE visit in appointment notes  3. Confirm consent - "In the setting of the current Covid19 crisis, you are scheduled for a (phone or video) visit with your provider on (date) at (time).  Just as we do with many in-office visits, in order for you to participate in this visit, we must obtain consent.  If you'd like, I can send this to your mychart (if signed up) or email for you to review.  Otherwise, I can obtain your verbal consent now.  All virtual visits are billed to your insurance company just like a normal visit would be.  By agreeing to a virtual visit, we'd like you to understand that the technology does not allow for your provider to perform an examination, and thus may limit your provider's ability to fully assess your condition. If your provider identifies any concerns that need to be evaluated in person, we will make arrangements to do so.  Finally, though the technology is pretty good, we cannot assure that it will always work on either your or our end, and in the setting of a video visit, we may have to convert it to a phone-only visit.  In either situation, we cannot ensure that we have a secure connection.  Are you willing to proceed?" STAFF: Did the patient verbally acknowledge consent to telehealth visit? Document  YES/NO here: YES  4. Advise patient to be prepared - "Two hours prior to your appointment, go ahead and check your blood pressure, pulse, oxygen saturation, and your weight (if you have the equipment to check those) and write them all down. When your visit starts, your provider will ask you for this information. If you have an Apple Watch or Kardia device, please plan to have heart rate information ready on the day of your appointment. Please have a pen and paper handy nearby the day of the visit as well."  5. Give patient instructions for MyChart download to smartphone OR Doximity/Doxy.me as below if video visit (depending on what platform provider is using)  6. Inform patient they will receive a phone call 15 minutes prior to their appointment time (may be from unknown caller ID) so they should be prepared to answer    TELEPHONE CALL NOTE  Vickie Ferguson has been deemed a candidate for a follow-up tele-health visit to limit community exposure during the Covid-19 pandemic. I spoke with the patient via phone to ensure availability of phone/video source, confirm preferred email & phone number, and discuss instructions and expectations.  I reminded Vickie Ferguson to be prepared with any vital sign and/or heart rhythm information that could potentially be obtained via home monitoring, at the time of her visit. I reminded Vickie Ferguson to expect a phone call prior to  her visit.  [name] 04/22/2018 2:47 PM   INSTRUCTIONS FOR DOWNLOADING THE MYCHART APP TO SMARTPHONE  - The patient must first make sure to have activated MyChart and know their login information - If Apple, go to [employer] and type in MyChart in the search bar and download the app. If Android, ask patient to go to [employer] and type in [place] in the search bar and download the app. The app is free but as with any other app downloads, their phone may require them to verify saved payment information or Apple/Android  password.  - The patient will need to then log into the app with their MyChart username and password, and select [facility] as their healthcare provider to link the account. When it is time for your visit, go to the MyChart app, find appointments, and click Begin Video Visit. Be sure to Select Allow for your device to access the Microphone and Camera for your visit. You will then be connected, and your provider will be with you shortly.  **If they have any issues connecting, or need assistance please contact MyChart service desk [phone])**  **If using a computer, in order to ensure the best quality for their visit they will need to use either of the following Internet Browsers: [employer], or Google Chrome**  IF USING DOXIMITY or DOXY.ME - The patient will receive a link just prior to their visit by text.     FULL LENGTH CONSENT FOR TELE-HEALTH VISIT   I hereby voluntarily request, consent and authorize [facility] and its employed or contracted physicians, physician assistants, nurse practitioners or other licensed health care professionals (the Practitioner), to provide me with telemedicine health care services (the Services") as deemed necessary by the treating Practitioner. I acknowledge and consent to receive the Services by the Practitioner via telemedicine. I understand that the telemedicine visit will involve communicating with the Practitioner through live audiovisual communication technology and the disclosure of certain medical information by electronic transmission. I acknowledge that I have been given the opportunity to request an in-person assessment or other available alternative prior to the telemedicine visit and am voluntarily participating in the telemedicine visit.  I understand that I have the right to withhold or withdraw my consent to the use of telemedicine in the course of my care at any time, without affecting my right to future care or treatment,  and that the Practitioner or I may terminate the telemedicine visit at any time. I understand that I have the right to inspect all information obtained and/or recorded in the course of the telemedicine visit and may receive copies of available information for a reasonable fee.  I understand that some of the potential risks of receiving the Services via telemedicine include:   Delay or interruption in medical evaluation due to technological equipment failure or disruption;  Information transmitted may not be sufficient (e.g. poor resolution of images) to allow for appropriate medical decision making by the Practitioner; and/or   In rare instances, security protocols could fail, causing a breach of personal health information.  Furthermore, I acknowledge that it is my responsibility to provide information about my medical history, conditions and care that is complete and accurate to the best of my ability. I acknowledge that Practitioner's advice, recommendations, and/or decision may be based on factors not within their control, such as incomplete or inaccurate data provided by me or distortions of diagnostic images or specimens that may result from electronic transmissions. I  understand that the practice of medicine is not an exact science and that Practitioner makes no warranties or guarantees regarding treatment outcomes. I acknowledge that I will receive a copy of this consent concurrently upon execution via email to the email address I last provided but may also request a printed copy by calling the office of [facility].    I understand that my insurance will be billed for this visit.   I have read or had this consent read to me.  I understand the contents of this consent, which adequately explains the benefits and risks of the Services being provided via telemedicine.   I have been provided ample opportunity to ask questions regarding this consent and the Services and have had my questions  answered to my satisfaction.  I give my informed consent for the services to be provided through the use of telemedicine in my medical care  By participating in this telemedicine visit I agree to the above.

## 2018-05-06 ENCOUNTER — Telehealth (INDEPENDENT_AMBULATORY_CARE_PROVIDER_SITE_OTHER): Payer: Medicare Other | Admitting: Cardiology

## 2018-05-06 ENCOUNTER — Other Ambulatory Visit: Payer: Self-pay

## 2018-05-06 VITALS — BP 113/56 | HR 67 | Wt 286.6 lb

## 2018-05-06 DIAGNOSIS — E119 Type 2 diabetes mellitus without complications: Secondary | ICD-10-CM

## 2018-05-06 DIAGNOSIS — I472 Ventricular tachycardia: Secondary | ICD-10-CM

## 2018-05-06 DIAGNOSIS — I4729 Other ventricular tachycardia: Secondary | ICD-10-CM

## 2018-05-06 DIAGNOSIS — I10 Essential (primary) hypertension: Secondary | ICD-10-CM

## 2018-05-06 DIAGNOSIS — G4733 Obstructive sleep apnea (adult) (pediatric): Secondary | ICD-10-CM

## 2018-05-06 NOTE — Patient Instructions (Signed)
Medication Instructions:  Your physician recommends that you continue on your current medications as directed. Please refer to the Current Medication list given to you today.  If you need a refill on your cardiac medications before your next appointment, please call your pharmacy.   Lab work: None.  If you have labs (blood work) drawn today and your tests are completely normal, you will receive your results only by: Marland Kitchen MyChart Message (if you have MyChart) OR . A paper copy in the mail If you have any lab test that is abnormal or we need to change your treatment, we will call you to review the results.  Testing/Procedures: None.    Follow-Up: At [facility], you and your health needs are our priority.  As part of our continuing mission to provide you with exceptional heart care, we have created designated Provider Care Teams.  These Care Teams include your primary Cardiologist (physician) and Advanced Practice Providers (APPs -  Physician Assistants and Nurse Practitioners) who all work together to provide you with the care you need, when you need it. You will need a follow up appointment in 4 months.  Please call our office 2 months in advance to schedule this appointment.  You may see No primary care provider on file. or another member of our [facility] Provider Team in [place]: Shirlee More, MD . Jyl Heinz, MD  Any Other Special Instructions Will Be Listed Below (If Applicable).

## 2018-05-06 NOTE — Progress Notes (Signed)
Virtual Visit via Video Note   This visit type was conducted due to national recommendations for restrictions regarding the COVID-19 Pandemic (e.g. social distancing) in an effort to limit this patient's exposure and mitigate transmission in our community.  Due to her co-morbid illnesses, this patient is at least at moderate risk for complications without adequate follow up.  This format is felt to be most appropriate for this patient at this time.  All issues noted in this document were discussed and addressed.  A limited physical exam was performed with this format.  Please refer to the patient's chart for her consent to telehealth for [facility].  Evaluation Performed:  Follow-up visit  This visit type was conducted due to national recommendations for restrictions regarding the COVID-19 Pandemic (e.g. social distancing).  This format is felt to be most appropriate for this patient at this time.  All issues noted in this document were discussed and addressed.  No physical exam was performed (except for noted visual exam findings with Video Visits).  Please refer to the patient's chart (MyChart message for video visits and phone note for telephone visits) for the patient's consent to telehealth for [facility].  Date:  05/06/2018  ID: Vickie Ferguson, DOB 1955-06-23, MRN [id number]   Patient Location: [place] [address]   Provider location:   [place] Office  PCP:  Nicoletta Dress, MD  Cardiologist:  Jenne Campus, MD     Chief Complaint: Doing well just tired to be home  History of Present Illness:    Vickie Ferguson is a 63 y.o. female  who presents via audio/video conferencing for a telehealth visit today.  With morbid obesity, dyslipidemia, diabetes, diastolic congestive heart failure, nonsustained ventricular tachycardia she is doing overall well she is upset because she stays home for months already because of coronavirus situation.  Denies  having any chest pain, tightness, pressure, burning in the chest she does have some shortness of breath while walking.  But overall seems to be coping with the situation quite well.   The patient does not have symptoms concerning for COVID-19 infection (fever, chills, cough, or new SHORTNESS OF BREATH).    Prior CV studies:   The following studies were reviewed today:       No past medical history on file.  Past Surgical History:  Procedure Laterality Date  . CARDIAC CATHETERIZATION       Current Meds  Medication Sig  . ALPRAZolam (XANAX) 1 MG tablet Take 1 mg by mouth every 8 (eight) hours as needed. For anxiety   . atorvastatin (LIPITOR) 40 MG tablet Take 40 mg by mouth at bedtime.    . clopidogrel (PLAVIX) 75 MG tablet Take 1 tablet by mouth daily.  Marland Kitchen dexlansoprazole (DEXILANT) 60 MG capsule Take 1 tablet by mouth daily.  Marland Kitchen diltiazem (CARDIZEM CD) 120 MG 24 hr capsule Take 1 capsule (120 mg total) by mouth daily.  . fluticasone (FLONASE) 50 MCG/ACT nasal spray Place 2 sprays into the nose daily as needed. For allergies   . furosemide (LASIX) 40 MG tablet Take 40 mg by mouth 2 (two) times daily.    . hydroxypropyl methylcellulose (ISOPTO TEARS) 2.5 % ophthalmic solution Place 2 drops into both eyes 3 (three) times daily as needed. For dry eyes   . levothyroxine (SYNTHROID, LEVOTHROID) 75 MCG tablet Take 1 tablet by mouth daily.  . metFORMIN (GLUCOPHAGE) 500 MG tablet Take 500 mg by mouth  2 (two) times daily with a meal.   . metoprolol tartrate (LOPRESSOR) 50 MG tablet Take 1 tablet (50 mg total) by mouth 2 (two) times daily.  . nitroGLYCERIN (NITROSTAT) 0.4 MG SL tablet Place 1 tablet (0.4 mg total) under the tongue every 5 (five) minutes as needed.  . ondansetron (ZOFRAN) 4 MG tablet Take 4 mg by mouth 3 (three) times daily as needed. For nausea   . Oxycodone HCl 10 MG TABS Take 10 mg by mouth every 4 (four) hours as needed.  . pioglitazone (ACTOS) 45 MG tablet Take 45 mg by  mouth daily.    . polyethylene glycol (MIRALAX / GLYCOLAX) packet Take 17 g by mouth every other day.        Family History: [name] family history includes Heart disease in her mother.   ROS:   Please see the history of present illness.     All other systems reviewed and are negative.   Labs/Other Tests and Data Reviewed:     Recent Labs: No results found for requested labs within last 8760 hours.  Recent Lipid Panel No results found for: CHOL, TRIG, HDL, CHOLHDL, VLDL, LDLCALC, LDLDIRECT    Exam:    Vital Signs:  BP (!) 113/56   Pulse 67   Wt 286 lb 9.6 oz (130 kg)   BMI 50.77 kg/m     Wt Readings from Last 3 Encounters:  05/06/18 286 lb 9.6 oz (130 kg)  12/13/17 295 lb (133.8 kg)  07/02/17 298 lb (135.2 kg)     Well nourished, well developed in no acute distress. Alert awake and at that time 3.  She is happy to be able to talk to me over the phone we do talk using video link.  She is not in any distress  Diagnosis for this visit:   1. Essential hypertension   2. Ventricular tachycardia (paroxysmal) ([place])   3. OSA (obstructive sleep apnea)   4. Type 2 diabetes mellitus without complication, without long-term current use of insulin ([place])      ASSESSMENT & PLAN:    1.  Essential hypertension blood pressure well controlled continue present management. 2.  Ventricular tachycardia denies having any dizziness or passing out.  Stable. 3.  Obstructive sleep apnea she does have significant problem but is not able to tolerate any equipment.  She described the fact that she has to wake up many times during the night.  And actually does not sleep well.  There was multiple deaths in her family this year and is very stressful on her on top of that the situation with coronavirus stress her a lot.  We spent a great deal amount of month of time talking about it and I gave her some encouragement. 4.  Type 2 diabetes followed by primary care physician apparently recently  her medication had to be cut down because of sugar being low.  Her A1c last time was barely above 5.  COVID-19 Education: The signs and symptoms of COVID-19 were discussed with the patient and how to seek care for testing (follow up with PCP or arrange E-visit).  The importance of social distancing was discussed today.  Patient Risk:   After full review of this patients clinical status, I feel that they are at least moderate risk at this time.  Time:   Today, I have spent 18 minutes with the patient with telehealth technology discussing pt health issues.  I spent 5 minutes reviewing her chart before the visit.  Visit was finished at 1:41 PM.    Medication Adjustments/Labs and Tests Ordered: Current medicines are reviewed at length with the patient today.  Concerns regarding medicines are outlined above.  No orders of the defined types were placed in this encounter.  Medication changes: No orders of the defined types were placed in this encounter.    Disposition: Follow-up in 4 months  Signed, Park Liter, MD, [facility] 05/06/2018 1:42 PM    [place]

## 2018-05-16 ENCOUNTER — Telehealth: Payer: Self-pay

## 2018-05-16 NOTE — Telephone Encounter (Signed)
PMH: obesity (BMI 50%), DLD, DM, diastolic heart failure, SVT Last office visit 5/5 with Dr. Agustin Cree. Echo 05/2017: EF 73-56%, diastolic function not mentioned, mild aortic sclerosis. EKG 07/2017: Sinus brady HR 57 with RBBB.  Proposed plan: Cut morning metoprolol dose in half to $Remov'25mg'CgpUfe$  due to bradycardia and symptomatic dizziness. Office visit for EKG next week with Dr. Agustin Cree. Ask her to keep BP, HR, weight log for the next 3-5 days.  Educate on nitroglycerin for chest pain (she has prescription). Educate on 911 precautions Review orthostatic hypotension precautions.

## 2018-05-16 NOTE — Telephone Encounter (Signed)
Patient informed to call [place] office with information she relayed to me to schedule visit. She states she has not seen her PCP in a few months and will call today. She will keep a log of her BP, HR and weights and will call office on Monday with updates.

## 2018-05-16 NOTE — Telephone Encounter (Signed)
Reviewed with Dr. Geraldo Pitter. Asked that she schedule an appointment to see her PCP as these symptoms are not specific to her heart problems. She may want to request her PCP draw some labs. Ask her to please keep a log of her BP, HR, weight over the next few days and call us Monday with this information.  Thanks! Loel Dubonnet, NP

## 2018-05-16 NOTE — Telephone Encounter (Signed)
Patient thinks she is having orthostatic BP issues. Last night she had heaviness in chest, nauseous, dizziness while in her bedroom. After 1 hour of resting chest pain went away but she still fatigued. Today she is having dizziness, HR 48 BP 125/53. No chest pain, she is fatigued and can not get up to move around due to dizziness. She takes cardizem $RemoveBefore'120mg'MbUndafyjrMGx$  and metoprolol 50 mg 2x's day, furosemide  $RemoveBef'40mg'TVjkfkegNc$  2x's day. Patient is frustrated because she does not know what to do after virtual visit but can not attend daily ADL's. Information forwarded to Dr. Vladimir Faster, NP for advisement???

## 2018-05-19 NOTE — Telephone Encounter (Signed)
I spoke to her doughter on the phone, pt was taking shower  Will try tio bring her to the office tomorrow for EKG and visit

## 2018-05-19 NOTE — Telephone Encounter (Signed)
Called patient to follow up on this call. Patient reports she didn't call Dr. Carolanne Grumbling office because she believes they will only refer her back to Dr. Agustin Cree and she said it was a waste of time. I informed her to just call and if they advised that to call us back. She doesn't plan to follow up with pcp as advised. She still feels dizzy when she gets up and her heart rate drops to the 40's when doing so and she has noticed her blood pressure has gotten as lows as 92/52 when she stands up along with shortness of breath and nausea. This morning her blood pressure is 132/67 and heart rate 68. Will consult with Dr. Agustin Cree.

## 2018-05-19 NOTE — Telephone Encounter (Signed)
Okay that is perfectly fine.  I understand Dr. Agustin Cree will now evaluate this.  Please let me know if I can be of any help.  Thank you and have a good day

## 2018-05-19 NOTE — Telephone Encounter (Signed)
Patient scheduled for appointment tomorrow.

## 2018-05-20 ENCOUNTER — Other Ambulatory Visit: Payer: Self-pay

## 2018-05-20 ENCOUNTER — Ambulatory Visit (INDEPENDENT_AMBULATORY_CARE_PROVIDER_SITE_OTHER): Payer: Medicare Other | Admitting: Cardiology

## 2018-05-20 ENCOUNTER — Encounter: Payer: Self-pay | Admitting: Cardiology

## 2018-05-20 VITALS — BP 124/80 | HR 64 | Wt 287.6 lb

## 2018-05-20 DIAGNOSIS — R072 Precordial pain: Secondary | ICD-10-CM | POA: Diagnosis not present

## 2018-05-20 DIAGNOSIS — I10 Essential (primary) hypertension: Secondary | ICD-10-CM | POA: Diagnosis not present

## 2018-05-20 DIAGNOSIS — Z794 Long term (current) use of insulin: Secondary | ICD-10-CM | POA: Diagnosis not present

## 2018-05-20 DIAGNOSIS — R0602 Shortness of breath: Secondary | ICD-10-CM | POA: Diagnosis not present

## 2018-05-20 DIAGNOSIS — D509 Iron deficiency anemia, unspecified: Secondary | ICD-10-CM

## 2018-05-20 DIAGNOSIS — I4729 Other ventricular tachycardia: Secondary | ICD-10-CM

## 2018-05-20 DIAGNOSIS — I472 Ventricular tachycardia: Secondary | ICD-10-CM

## 2018-05-20 MED ORDER — METOPROLOL TARTRATE 25 MG PO TABS: 25.0000 mg | ORAL_TABLET | Freq: Two times a day (BID) | ORAL | 1 refills | Status: DC

## 2018-05-20 NOTE — Progress Notes (Addendum)
Cardiology Office Note:    Date:  05/20/2018   ID:  Vickie Ferguson, DOB 11/08/1955, MRN [id number]  PCP:  Nicoletta Dress, MD  Cardiologist:  Jenne Campus, MD    Referring MD: Nicoletta Dress, MD   Chief Complaint  Patient presents with  . Bradycardia  . Hypotension  . Dizziness  . Shortness of Breath    Last 2 nights at night when laying down  Have some problem  History of Present Illness:    Vickie Ferguson is a 63 y.o. female complex past medical history which include morbid obesity, dyspnea on exertion, diabetes, dyslipidemia requested to be seen today because for the last few days she experienced some strange symptoms she described to have episode of dizziness and weakness and fatigue happen when she gets up very quickly.  On top of that she described difficulty breathing at night.  Sometimes she feels some heaviness at night she needs to sit up and she will feel better.  Interesting normal physical exam today I do not see any evidence of congestive heart failure and she seems to be doing well at the time of my interview.  Overall she said it is very difficult for her to do anything without getting short of breath.  No past medical history on file.  Past Surgical History:  Procedure Laterality Date  . CARDIAC CATHETERIZATION      Current Medications: Current Meds  Medication Sig  . ALPRAZolam (XANAX) 1 MG tablet Take 1 mg by mouth every 8 (eight) hours as needed. For anxiety   . atorvastatin (LIPITOR) 40 MG tablet Take 40 mg by mouth at bedtime.    . clopidogrel (PLAVIX) 75 MG tablet Take 1 tablet by mouth daily.  Marland Kitchen dexlansoprazole (DEXILANT) 60 MG capsule Take 1 tablet by mouth daily.  Marland Kitchen diltiazem (CARDIZEM CD) 120 MG 24 hr capsule Take 1 capsule (120 mg total) by mouth daily.  . fluticasone (FLONASE) 50 MCG/ACT nasal spray Place 2 sprays into the nose daily as needed. For allergies   . furosemide (LASIX) 40 MG tablet Take 40 mg by mouth 2 (two) times daily.    .  hydroxypropyl methylcellulose (ISOPTO TEARS) 2.5 % ophthalmic solution Place 2 drops into both eyes 3 (three) times daily as needed. For dry eyes   . levothyroxine (SYNTHROID, LEVOTHROID) 75 MCG tablet Take 1 tablet by mouth daily.  . metFORMIN (GLUCOPHAGE) 500 MG tablet Take 500 mg by mouth 2 (two) times daily with a meal.   . metoprolol tartrate (LOPRESSOR) 25 MG tablet Take 1 tablet (25 mg total) by mouth 2 (two) times daily.  . nitroGLYCERIN (NITROSTAT) 0.4 MG SL tablet Place 1 tablet (0.4 mg total) under the tongue every 5 (five) minutes as needed.  . ondansetron (ZOFRAN) 4 MG tablet Take 4 mg by mouth 3 (three) times daily as needed. For nausea   . Oxycodone HCl 10 MG TABS Take 10 mg by mouth every 4 (four) hours as needed.  . pioglitazone (ACTOS) 45 MG tablet Take 45 mg by mouth daily.    . [DISCONTINUED] metoprolol tartrate (LOPRESSOR) 50 MG tablet Take 1 tablet (50 mg total) by mouth 2 (two) times daily.     Allergies:   Propafenone; Codeine; Doxycycline; Isosorbide nitrate; Other; Procainamide; Rythmol [propafenone hcl]; and Sertraline   Social History   Socioeconomic History  . Marital status: Legally Separated    Spouse name: Not on file  . Number of children: Not on file  . Years  of education: Not on file  . Highest education level: Not on file  Occupational History  . Not on file  Social Needs  . Financial resource strain: Not on file  . Food insecurity:    Worry: Not on file    Inability: Not on file  . Transportation needs:    Medical: Not on file    Non-medical: Not on file  Tobacco Use  . Smoking status: Never Smoker  . Smokeless tobacco: Never Used  Substance and Sexual Activity  . Alcohol use: No    Alcohol/week: 0.0 standard drinks  . Drug use: No  . Sexual activity: Not on file  Lifestyle  . Physical activity:    Days per week: Not on file    Minutes per session: Not on file  . Stress: Not on file  Relationships  . Social connections:    Talks on  phone: Not on file    Gets together: Not on file    Attends religious service: Not on file    Active member of club or organization: Not on file    Attends meetings of clubs or organizations: Not on file    Relationship status: Not on file  Other Topics Concern  . Not on file  Social History Narrative  . Not on file     Family History: [name] family history includes Heart disease in her mother. ROS:   Please see the history of present illness.    All 14 point review of systems negative except as described per history of present illness  EKGs/Labs/Other Studies Reviewed:    EKG was technically difficult.  It appears to be sinus rhythm with right bundle branch block and criteria for LVH.  The QS complex morphology is identical to previous QS complex morphology.  Overall heart rate was 61 on that EKG  Recent Labs: No results found for requested labs within last 8760 hours.  Recent Lipid Panel No results found for: CHOL, TRIG, HDL, CHOLHDL, VLDL, LDLCALC, LDLDIRECT  Physical Exam:    VS:  BP 124/80   Pulse 64   Wt 287 lb 9.6 oz (130.5 kg)   SpO2 99%   BMI 50.95 kg/m     Wt Readings from Last 3 Encounters:  05/20/18 287 lb 9.6 oz (130.5 kg)  05/06/18 286 lb 9.6 oz (130 kg)  12/13/17 295 lb (133.8 kg)     GEN:  Well nourished, well developed in no acute distress HEENT: Normal NECK: No JVD; No carotid bruits LYMPHATICS: No lymphadenopathy CARDIAC: RRR, no murmurs, no rubs, no gallops RESPIRATORY:  Clear to auscultation without rales, wheezing or rhonchi  ABDOMEN: Soft, non-tender, non-distended MUSCULOSKELETAL:  No edema; No deformity  SKIN: Warm and dry LOWER EXTREMITIES: no swelling NEUROLOGIC:  Alert and oriented x 3 PSYCHIATRIC:  Normal affect   ASSESSMENT:    1. Essential hypertension   2. Shortness of breath   3. Iron deficiency anemia, unspecified iron deficiency anemia type   4. Precordial pain   5. Ventricular tachycardia (paroxysmal) ([place])   6.  Long term current use of insulin ([place])    PLAN:    In order of problems listed above:  1. Essential hypertension.  Blood pressure appears to be well controlled actually we do have episode of low blood pressure which is obviously concerning.  I will ask her to cut down metoprolol from 50 mg twice daily to 25 mg twice daily.  I told her that this maneuver should bring her heart  rate up as well as blood pressure up but also come back prior because of the rhythm that she had so I want her if she feels worse with this maneuver she need to go back to previous dose of metoprolol. 2. Shortness of breath.  I do not see any evidence of congestive heart failure on my physical exam but her symptoms are concerning.  I will ask her to have proBNP today.  We will do also troponin as well as complete metabolic panel.  I will schedule her to have echocardiogram done as well 3. Iron deficiency anemia.  She does not look particularly pale to be CBC will be done. 4. Precordial chest pain.  I will do troponin I pain does not look typical.  Does not happen with exertion happen usually at rest. 5. 5.  History of ventricular tachycardia no recent palpitations. 6. Long-term use of insulin.  Being followed by internal medicine team  Overall I am somewhat puzzled with her symptomatology.  The multiple potential explanation for her symptoms we will try to investigate and help her the best we can.   Medication Adjustments/Labs and Tests Ordered: Current medicines are reviewed at length with the patient today.  Concerns regarding medicines are outlined above.  Orders Placed This Encounter  Procedures  . Pro b natriuretic peptide (BNP)  . Comp Met (CMET)  . CBC  . Troponin I  . ECHOCARDIOGRAM COMPLETE   Medication changes:  Meds ordered this encounter  Medications  . metoprolol tartrate (LOPRESSOR) 25 MG tablet    Sig: Take 1 tablet (25 mg total) by mouth 2 (two) times daily.    Dispense:  180 tablet    Refill:  1     Signed, Park Liter, MD, [facility] 05/20/2018 11:42 AM    [place]

## 2018-05-20 NOTE — Patient Instructions (Signed)
Medication Instructions:  Your physician has recommended you make the following change in your medication:   Decrease: Metoprolol tartrate to 25 mg twice daily.   If you need a refill on your cardiac medications before your next appointment, please call your pharmacy.   Lab work: Your physician recommends that you return for lab work today: pro bnp, cmp, cbc, and troponin   If you have labs (blood work) drawn today and your tests are completely normal, you will receive your results only by: Marland Kitchen MyChart Message (if you have MyChart) OR . A paper copy in the mail If you have any lab test that is abnormal or we need to change your treatment, we will call you to review the results.  Testing/Procedures: Your physician has requested that you have an echocardiogram. Echocardiography is a painless test that uses sound waves to create images of your heart. It provides your doctor with information about the size and shape of your heart and how well your heart's chambers and valves are working. This procedure takes approximately one hour. There are no restrictions for this procedure.    Follow-Up: At [facility], you and your health needs are our priority.  As part of our continuing mission to provide you with exceptional heart care, we have created designated Provider Care Teams.  These Care Teams include your primary Cardiologist (physician) and Advanced Practice Providers (APPs -  Physician Assistants and Nurse Practitioners) who all work together to provide you with the care you need, when you need it. You will need a follow up appointment in 3 days.  Please call our office 2 months in advance to schedule this appointment.  You may see No primary care provider on file. or another member of our [employer] Provider Team in [place]: Shirlee More, MD . Jyl Heinz, MD  Any Other Special Instructions Will Be Listed Below (If Applicable).   Echocardiogram An echocardiogram is a procedure  that uses painless sound waves (ultrasound) to produce an image of the heart. Images from an echocardiogram can provide important information about:  Signs of coronary artery disease (CAD).  Aneurysm detection. An aneurysm is a weak or damaged part of an artery wall that bulges out from the normal force of blood pumping through the body.  Heart size and shape. Changes in the size or shape of the heart can be associated with certain conditions, including heart failure, aneurysm, and CAD.  Heart muscle function.  Heart valve function.  Signs of a past heart attack.  Fluid buildup around the heart.  Thickening of the heart muscle.  A tumor or infectious growth around the heart valves. Tell a health care provider about:  Any allergies you have.  All medicines you are taking, including vitamins, herbs, eye drops, creams, and over-the-counter medicines.  Any blood disorders you have.  Any surgeries you have had.  Any medical conditions you have.  Whether you are pregnant or may be pregnant. What are the risks? Generally, this is a safe procedure. However, problems may occur, including:  Allergic reaction to dye (contrast) that may be used during the procedure. What happens before the procedure? No specific preparation is needed. You may eat and drink normally. What happens during the procedure?   An IV tube may be inserted into one of your veins.  You may receive contrast through this tube. A contrast is an injection that improves the quality of the pictures from your heart.  A gel will be applied to your chest.  A wand-like tool (transducer) will be moved over your chest. The gel will help to transmit the sound waves from the transducer.  The sound waves will harmlessly bounce off of your heart to allow the heart images to be captured in real-time motion. The images will be recorded on a computer. The procedure may vary among health care providers and hospitals. What  happens after the procedure?  You may return to your normal, everyday life, including diet, activities, and medicines, unless your health care provider tells you not to do that. Summary  An echocardiogram is a procedure that uses painless sound waves (ultrasound) to produce an image of the heart.  Images from an echocardiogram can provide important information about the size and shape of your heart, heart muscle function, heart valve function, and fluid buildup around your heart.  You do not need to do anything to prepare before this procedure. You may eat and drink normally.  After the echocardiogram is completed, you may return to your normal, everyday life, unless your health care provider tells you not to do that. This information is not intended to replace advice given to you by your health care provider. Make sure you discuss any questions you have with your health care provider. Document Released: 12/16/1999 Document Revised: 01/21/2016 Document Reviewed: 01/21/2016 Elsevier Interactive Patient Education  2019 [employer].

## 2018-05-21 LAB — COMPREHENSIVE METABOLIC PANEL
ALT: 9 IU/L (ref 0–32)
AST: 19 IU/L (ref 0–40)
Albumin/Globulin Ratio: 1.3 (ref 1.2–2.2)
Albumin: 4 g/dL (ref 3.8–4.8)
Alkaline Phosphatase: 86 IU/L (ref 39–117)
BUN/Creatinine Ratio: 18 (ref 12–28)
BUN: 32 mg/dL — ABNORMAL HIGH (ref 8–27)
Bilirubin Total: 0.3 mg/dL (ref 0.0–1.2)
CO2: 23 mmol/L (ref 20–29)
Calcium: 9.4 mg/dL (ref 8.7–10.3)
Chloride: 99 mmol/L (ref 96–106)
Creatinine, Ser: 1.82 mg/dL — ABNORMAL HIGH (ref 0.57–1.00)
GFR calc Af Amer: 34 mL/min/{1.73_m2} — ABNORMAL LOW (ref >59)
GFR calc non Af Amer: 29 mL/min/{1.73_m2} — ABNORMAL LOW (ref >59)
Globulin, Total: 3.1 g/dL (ref 1.5–4.5)
Glucose: 127 mg/dL — ABNORMAL HIGH (ref 65–99)
Potassium: 5 mmol/L (ref 3.5–5.2)
Sodium: 137 mmol/L (ref 134–144)
Total Protein: 7.1 g/dL (ref 6.0–8.5)

## 2018-05-21 LAB — CBC
Hematocrit: 33.9 % — ABNORMAL LOW (ref 34.0–46.6)
Hemoglobin: 11.1 g/dL (ref 11.1–15.9)
MCH: 28.2 pg (ref 26.6–33.0)
MCHC: 32.7 g/dL (ref 31.5–35.7)
MCV: 86 fL (ref 79–97)
Platelets: 228 10*3/uL (ref 150–450)
RBC: 3.94 x10E6/uL (ref 3.77–5.28)
RDW: 14.1 % (ref 11.7–15.4)
WBC: 5.9 10*3/uL (ref 3.4–10.8)

## 2018-05-21 LAB — PRO B NATRIURETIC PEPTIDE: NT-Pro BNP: 549 pg/mL — ABNORMAL HIGH (ref 0–287)

## 2018-05-21 LAB — TROPONIN I: Troponin I: <0.01 ng/mL (ref 0.00–0.04)

## 2018-05-22 NOTE — Addendum Note (Signed)
Addended by: Ashok Norris on: 05/22/2018 10:04 AM   Modules accepted: Orders

## 2018-05-23 ENCOUNTER — Other Ambulatory Visit: Payer: Self-pay

## 2018-05-23 ENCOUNTER — Telehealth (INDEPENDENT_AMBULATORY_CARE_PROVIDER_SITE_OTHER): Payer: Medicare Other | Admitting: Cardiology

## 2018-05-23 ENCOUNTER — Encounter: Payer: Self-pay | Admitting: Cardiology

## 2018-05-23 VITALS — BP 98/57 | HR 68 | Wt 285.2 lb

## 2018-05-23 DIAGNOSIS — I10 Essential (primary) hypertension: Secondary | ICD-10-CM

## 2018-05-23 DIAGNOSIS — E119 Type 2 diabetes mellitus without complications: Secondary | ICD-10-CM

## 2018-05-23 DIAGNOSIS — R0602 Shortness of breath: Secondary | ICD-10-CM

## 2018-05-23 MED ORDER — FUROSEMIDE 40 MG PO TABS: ORAL_TABLET | ORAL | 1 refills | Status: DC

## 2018-05-23 NOTE — Addendum Note (Signed)
Addended by: Ashok Norris on: 05/23/2018 02:49 PM   Modules accepted: Orders

## 2018-05-23 NOTE — Progress Notes (Signed)
Virtual Visit via Video Note   This visit type was conducted due to national recommendations for restrictions regarding the COVID-19 Pandemic (e.g. social distancing) in an effort to limit this patient's exposure and mitigate transmission in our community.  Due to her co-morbid illnesses, this patient is at least at moderate risk for complications without adequate follow up.  This format is felt to be most appropriate for this patient at this time.  All issues noted in this document were discussed and addressed.  A limited physical exam was performed with this format.  Please refer to the patient's chart for her consent to telehealth for [facility].  Evaluation Performed:  Follow-up visit  This visit type was conducted due to national recommendations for restrictions regarding the COVID-19 Pandemic (e.g. social distancing).  This format is felt to be most appropriate for this patient at this time.  All issues noted in this document were discussed and addressed.  No physical exam was performed (except for noted visual exam findings with Video Visits).  Please refer to the patient's chart (MyChart message for video visits and phone note for telephone visits) for the patient's consent to telehealth for [facility].  Date:  05/23/2018  ID: SHAKIAH WESTER, DOB 07-Feb-1955, MRN [id number]   Patient Location: [place] [address]   Provider location:   [place] Office  PCP:  Nicoletta Dress, MD  Cardiologist:  Jenne Campus, MD     Chief Complaint: Doing slightly better  History of Present Illness:    Vickie Ferguson is a 63 y.o. female  who presents via audio/video conferencing for a telehealth visit today.  Complex past medical history.  That include diastolic congestive heart failure, palpitations.  I saw her last time when she felt very poorly her heart rate was slow and also she got situation like her blood pressure will drop.  I decided to cut down  metoprolol with some reservations I am most worried about potentially reactivation of her arrhythmia however that did not happen overall she is feeling slightly stronger and slightly better.  I did receive her blood test results and the multiple abnormalities including her creatinine being elevated I will try to cut down her diuretics slowly.  I am concerned about potentially having congestive heart failure.  So asked her to check her weight every day and since she is seeing increasing her weight by 2 pounds in 3 consecutive days to let us know.  Otherwise she is doing well   The patient does not have symptoms concerning for COVID-19 infection (fever, chills, cough, or new SHORTNESS OF BREATH).    Prior CV studies:   The following studies were reviewed today:       No past medical history on file.  Past Surgical History:  Procedure Laterality Date  . CARDIAC CATHETERIZATION       Current Meds  Medication Sig  . ALPRAZolam (XANAX) 1 MG tablet Take 1 mg by mouth every 8 (eight) hours as needed. For anxiety   . atorvastatin (LIPITOR) 40 MG tablet Take 40 mg by mouth at bedtime.    . clopidogrel (PLAVIX) 75 MG tablet Take 1 tablet by mouth daily.  Marland Kitchen dexlansoprazole (DEXILANT) 60 MG capsule Take 1 tablet by mouth daily.  Marland Kitchen diltiazem (CARDIZEM CD) 120 MG 24 hr capsule Take 1 capsule (120 mg total) by mouth daily.  . fluticasone (FLONASE) 50 MCG/ACT nasal spray Place 2 sprays into the nose  daily as needed. For allergies   . furosemide (LASIX) 40 MG tablet Take 40 mg by mouth 2 (two) times daily.    . hydroxypropyl methylcellulose (ISOPTO TEARS) 2.5 % ophthalmic solution Place 2 drops into both eyes 3 (three) times daily as needed. For dry eyes   . levothyroxine (SYNTHROID, LEVOTHROID) 75 MCG tablet Take 1 tablet by mouth daily.  . metFORMIN (GLUCOPHAGE) 500 MG tablet Take 500 mg by mouth 2 (two) times daily with a meal.   . metoprolol tartrate (LOPRESSOR) 25 MG tablet Take 1 tablet (25 mg  total) by mouth 2 (two) times daily. (Patient taking differently: Take 12.5 mg by mouth 2 (two) times daily. )  . nitroGLYCERIN (NITROSTAT) 0.4 MG SL tablet Place 1 tablet (0.4 mg total) under the tongue every 5 (five) minutes as needed.  . ondansetron (ZOFRAN) 4 MG tablet Take 4 mg by mouth 3 (three) times daily as needed. For nausea   . Oxycodone HCl 10 MG TABS Take 10 mg by mouth every 4 (four) hours as needed.  . pioglitazone (ACTOS) 45 MG tablet Take 45 mg by mouth daily.    . polyethylene glycol (MIRALAX / GLYCOLAX) packet Take 17 g by mouth every other day.        Family History: [name] family history includes Heart disease in her mother.   ROS:   Please see the history of present illness.     All other systems reviewed and are negative.   Labs/Other Tests and Data Reviewed:     Recent Labs: 05/20/2018: ALT 9; BUN 32; Creatinine, Ser 1.82; Hemoglobin 11.1; NT-Pro BNP 549; Platelets 228; Potassium 5.0; Sodium 137  Recent Lipid Panel No results found for: CHOL, TRIG, HDL, CHOLHDL, VLDL, LDLCALC, LDLDIRECT    Exam:    Vital Signs:  BP (!) 98/57   Pulse 68   Wt 285 lb 3.2 oz (129.4 kg)   BMI 50.52 kg/m     Wt Readings from Last 3 Encounters:  05/23/18 285 lb 3.2 oz (129.4 kg)  05/20/18 287 lb 9.6 oz (130.5 kg)  05/06/18 286 lb 9.6 oz (130 kg)     Well nourished, well developed in no acute distress. Alert awake oriented x3 talking to me through the video link seems to be doing better.  Denies having any chest pain her legs are elevated.  Diagnosis for this visit:   1. Essential hypertension   2. Type 2 diabetes mellitus without complication, without long-term current use of insulin ([place])   3. Shortness of breath   4. Morbid (severe) obesity due to excess calories ([place])      ASSESSMENT & PLAN:    1.  Essential hypertension blood pressures controlled continue present management. 2.  Type 2 diabetes followed by primary care physician apparently stable.  3.  Shortness of breath denies having any. 4.  Morbid obesity she lost some significant amount of weight within last few years. 5.  Fatigue and tiredness and bradycardia.  Beta-blocker has been reduced slightly with some improvement.  I will cut down diuretic this time to see if it can improve kidney function as well as her overall wellbeing.  COVID-19 Education: The signs and symptoms of COVID-19 were discussed with the patient and how to seek care for testing (follow up with PCP or arrange E-visit).  The importance of social distancing was discussed today.  Patient Risk:   After full review of this patients clinical status, I feel that they are at least moderate risk  at this time.  Time:   Today, I have spent 12 minutes with the patient with telehealth technology discussing pt health issues.  I spent 5 minutes reviewing her chart before the visit.  Visit was finished at 1:45 PM.    Medication Adjustments/Labs and Tests Ordered: Current medicines are reviewed at length with the patient today.  Concerns regarding medicines are outlined above.  No orders of the defined types were placed in this encounter.  Medication changes: No orders of the defined types were placed in this encounter.    Disposition: Follow-up in 1 week  Signed, Park Liter, MD, [facility] 05/23/2018 1:46 PM    [place]

## 2018-05-23 NOTE — Patient Instructions (Signed)
Medication Instructions:  Your physician has recommended you make the following change in your medication:    Decrease: Lasix to 40 mg in the morning and 20 mg in the evening.   If you need a refill on your cardiac medications before your next appointment, please call your pharmacy.   Lab work: None.  If you have labs (blood work) drawn today and your tests are completely normal, you will receive your results only by: Marland Kitchen MyChart Message (if you have MyChart) OR . A paper copy in the mail If you have any lab test that is abnormal or we need to change your treatment, we will call you to review the results.  Testing/Procedures: None.   Follow-Up: At [facility], you and your health needs are our priority.  As part of our continuing mission to provide you with exceptional heart care, we have created designated Provider Care Teams.  These Care Teams include your primary Cardiologist (physician) and Advanced Practice Providers (APPs -  Physician Assistants and Nurse Practitioners) who all work together to provide you with the care you need, when you need it. You will need a follow up appointment in 1 weeks.  Please call our office 2 months in advance to schedule this appointment.  You may see No primary care provider on file. or another member of our [employer] Provider Team in [place]: Shirlee More, MD . Jyl Heinz, MD  Any Other Special Instructions Will Be Listed Below (If Applicable).

## 2018-05-30 ENCOUNTER — Other Ambulatory Visit: Payer: Self-pay

## 2018-05-30 ENCOUNTER — Telehealth (INDEPENDENT_AMBULATORY_CARE_PROVIDER_SITE_OTHER): Payer: Medicare Other | Admitting: Cardiology

## 2018-05-30 ENCOUNTER — Encounter: Payer: Self-pay | Admitting: Cardiology

## 2018-05-30 VITALS — BP 89/48 | HR 67 | Wt 286.0 lb

## 2018-05-30 DIAGNOSIS — I10 Essential (primary) hypertension: Secondary | ICD-10-CM

## 2018-05-30 DIAGNOSIS — R072 Precordial pain: Secondary | ICD-10-CM

## 2018-05-30 DIAGNOSIS — R0602 Shortness of breath: Secondary | ICD-10-CM

## 2018-05-30 NOTE — Progress Notes (Signed)
Virtual Visit via Video Note   This visit type was conducted due to national recommendations for restrictions regarding the COVID-19 Pandemic (e.g. social distancing) in an effort to limit this patient's exposure and mitigate transmission in our community.  Due to her co-morbid illnesses, this patient is at least at moderate risk for complications without adequate follow up.  This format is felt to be most appropriate for this patient at this time.  All issues noted in this document were discussed and addressed.  A limited physical exam was performed with this format.  Please refer to the patient's chart for her consent to telehealth for [facility].  Evaluation Performed:  Follow-up visit  This visit type was conducted due to national recommendations for restrictions regarding the COVID-19 Pandemic (e.g. social distancing).  This format is felt to be most appropriate for this patient at this time.  All issues noted in this document were discussed and addressed.  No physical exam was performed (except for noted visual exam findings with Video Visits).  Please refer to the patient's chart (MyChart message for video visits and phone note for telephone visits) for the patient's consent to telehealth for [facility].  Date:  05/30/2018  ID: AVALEY COOP, DOB 1955/04/01, MRN [id number]   Patient Location: [place] [address]   Provider location:   [place] Office  PCP:  Nicoletta Dress, MD  Cardiologist:  Jenne Campus, MD     Chief Complaint: Not doing well  History of Present Illness:    Vickie Ferguson is a 63 y.o. female  who presents via audio/video conferencing for a telehealth visit today.  Very complex past medical history.  Diastolic congestive heart failure, kidney failure, her last time we reduce dose of beta-blocker she started feeling better after that then last time when I seen her I reduce dose of diuretic because of increasing creatinine  she is now doing well.  She says she feels tired and exhausted does not have any pain just not feeling well.   The patient does not have symptoms concerning for COVID-19 infection (fever, chills, cough, or new SHORTNESS OF BREATH).    Prior CV studies:   The following studies were reviewed today:       No past medical history on file.  Past Surgical History:  Procedure Laterality Date  . CARDIAC CATHETERIZATION       Current Meds  Medication Sig  . ALPRAZolam (XANAX) 1 MG tablet Take 1 mg by mouth every 8 (eight) hours as needed. For anxiety   . atorvastatin (LIPITOR) 40 MG tablet Take 40 mg by mouth at bedtime.    . clopidogrel (PLAVIX) 75 MG tablet Take 1 tablet by mouth daily.  Marland Kitchen dexlansoprazole (DEXILANT) 60 MG capsule Take 1 tablet by mouth daily.  Marland Kitchen diltiazem (CARDIZEM CD) 120 MG 24 hr capsule Take 1 capsule (120 mg total) by mouth daily.  . fluticasone (FLONASE) 50 MCG/ACT nasal spray Place 2 sprays into the nose daily as needed. For allergies   . furosemide (LASIX) 40 MG tablet Take 40 mg in the morning and 20 mg in the evening  . hydroxypropyl methylcellulose (ISOPTO TEARS) 2.5 % ophthalmic solution Place 2 drops into both eyes 3 (three) times daily as needed. For dry eyes   . levothyroxine (SYNTHROID, LEVOTHROID) 75 MCG tablet Take 1 tablet by mouth daily.  . metFORMIN (GLUCOPHAGE) 500 MG tablet Take 500 mg by mouth 2 (two) times  daily with a meal.   . metoprolol tartrate (LOPRESSOR) 25 MG tablet Take 1 tablet (25 mg total) by mouth 2 (two) times daily. (Patient taking differently: Take 12.5 mg by mouth 2 (two) times daily. )  . nitroGLYCERIN (NITROSTAT) 0.4 MG SL tablet Place 1 tablet (0.4 mg total) under the tongue every 5 (five) minutes as needed.  . ondansetron (ZOFRAN) 4 MG tablet Take 4 mg by mouth 3 (three) times daily as needed. For nausea   . Oxycodone HCl 10 MG TABS Take 10 mg by mouth every 4 (four) hours as needed.  . pioglitazone (ACTOS) 45 MG tablet Take  45 mg by mouth daily.    . polyethylene glycol (MIRALAX / GLYCOLAX) packet Take 17 g by mouth every other day.        Family History: [name] family history includes Heart disease in her mother.   ROS:   Please see the history of present illness.     All other systems reviewed and are negative.   Labs/Other Tests and Data Reviewed:     Recent Labs: 05/20/2018: ALT 9; BUN 32; Creatinine, Ser 1.82; Hemoglobin 11.1; NT-Pro BNP 549; Platelets 228; Potassium 5.0; Sodium 137  Recent Lipid Panel No results found for: CHOL, TRIG, HDL, CHOLHDL, VLDL, LDLCALC, LDLDIRECT    Exam:    Vital Signs:  BP (!) 89/48   Pulse 67   Wt 286 lb (129.7 kg)   BMI 50.66 kg/m     Wt Readings from Last 3 Encounters:  05/30/18 286 lb (129.7 kg)  05/23/18 285 lb 3.2 oz (129.4 kg)  05/20/18 287 lb 9.6 oz (130.5 kg)     Well nourished, well developed in no acute distress. Alert awake oriented x3 talking to me over the video link.  Described to have very atypical symptoms she does not have any signs or symptoms of infection.  There is no burning while urinating.  There is no more shortness of breath just not feeling well  Diagnosis for this visit:   1. Essential hypertension   2. Precordial pain   3. Morbid (severe) obesity due to excess calories ([place])   4. Shortness of breath      ASSESSMENT & PLAN:    1.  Essential hypertension blood pressure is actually well controlled in contrary we have problem with low blood pressure. 2.  Precordial chest pain denies having any. 3.  Morbid obesity she lost significant month of weight and doing well. 4.  Shortness of breath.  Denies having any now but overall does not feel well.  I bring him to the office next week to look at her we may be forced to do some blood decide more about her kidney dysfunction.  COVID-19 Education: The signs and symptoms of COVID-19 were discussed with the patient and how to seek care for testing (follow up with PCP or  arrange E-visit).  The importance of social distancing was discussed today.  Patient Risk:   After full review of this patients clinical status, I feel that they are at least moderate risk at this time.  Time:   Today, I have spent 14 minutes with the patient with telehealth technology discussing pt health issues.  I spent 5 minutes reviewing her chart before the visit.  Visit was finished at 3:39 PM.    Medication Adjustments/Labs and Tests Ordered: Current medicines are reviewed at length with the patient today.  Concerns regarding medicines are outlined above.  No orders of the defined types  were placed in this encounter.  Medication changes: No orders of the defined types were placed in this encounter.    Disposition: Follow-up next week in the office  Signed, Park Liter, MD, [facility] 05/30/2018 3:40 PM    [place]

## 2018-05-30 NOTE — Patient Instructions (Signed)
Medication Instructions:  .isntcur  If you need a refill on your cardiac medications before your next appointment, please call your pharmacy.   Lab work: None.  If you have labs (blood work) drawn today and your tests are completely normal, you will receive your results only by: Marland Kitchen MyChart Message (if you have MyChart) OR . A paper copy in the mail If you have any lab test that is abnormal or we need to change your treatment, we will call you to review the results.  Testing/Procedures: None.   Follow-Up: At [facility], you and your health needs are our priority.  As part of our continuing mission to provide you with exceptional heart care, we have created designated Provider Care Teams.  These Care Teams include your primary Cardiologist (physician) and Advanced Practice Providers (APPs -  Physician Assistants and Nurse Practitioners) who all work together to provide you with the care you need, when you need it. You will need a follow up appointment in 1 weeks.  Please call our office 2 months in advance to schedule this appointment.  You may see No primary care provider on file. or another member of our [employer] Provider Team in [place]: Shirlee More, MD . Jyl Heinz, MD  Any Other Special Instructions Will Be Listed Below (If Applicable).

## 2018-06-03 ENCOUNTER — Ambulatory Visit (INDEPENDENT_AMBULATORY_CARE_PROVIDER_SITE_OTHER): Payer: Medicare Other | Admitting: Cardiology

## 2018-06-03 ENCOUNTER — Other Ambulatory Visit: Payer: Self-pay

## 2018-06-03 ENCOUNTER — Encounter: Payer: Self-pay | Admitting: Cardiology

## 2018-06-03 VITALS — BP 122/64 | HR 62 | Ht 63.0 in | Wt 287.4 lb

## 2018-06-03 DIAGNOSIS — R072 Precordial pain: Secondary | ICD-10-CM

## 2018-06-03 DIAGNOSIS — I4729 Other ventricular tachycardia: Secondary | ICD-10-CM

## 2018-06-03 DIAGNOSIS — I472 Ventricular tachycardia, unspecified: Secondary | ICD-10-CM

## 2018-06-03 DIAGNOSIS — G629 Polyneuropathy, unspecified: Secondary | ICD-10-CM

## 2018-06-03 DIAGNOSIS — R5383 Other fatigue: Secondary | ICD-10-CM | POA: Diagnosis not present

## 2018-06-03 DIAGNOSIS — Z79899 Other long term (current) drug therapy: Secondary | ICD-10-CM | POA: Diagnosis not present

## 2018-06-03 DIAGNOSIS — R0602 Shortness of breath: Secondary | ICD-10-CM

## 2018-06-03 DIAGNOSIS — D649 Anemia, unspecified: Secondary | ICD-10-CM

## 2018-06-03 DIAGNOSIS — E119 Type 2 diabetes mellitus without complications: Secondary | ICD-10-CM | POA: Diagnosis not present

## 2018-06-03 DIAGNOSIS — I10 Essential (primary) hypertension: Secondary | ICD-10-CM

## 2018-06-03 DIAGNOSIS — D509 Iron deficiency anemia, unspecified: Secondary | ICD-10-CM | POA: Diagnosis not present

## 2018-06-03 DIAGNOSIS — Z794 Long term (current) use of insulin: Secondary | ICD-10-CM

## 2018-06-03 NOTE — Progress Notes (Signed)
Cardiology Office Note:    Date:  06/03/2018   ID:  Vickie Ferguson, DOB Jan 13, 1955, MRN [id number]  PCP:  Nicoletta Dress, MD  Cardiologist:  Jenne Campus, MD    Referring MD: Nicoletta Dress, MD   No chief complaint on file. I am still not feeling well  History of Present Illness:    Vickie Ferguson is a 63 y.o. female with essential hypertension, obstructive sleep apnea, history of ventricular tachycardia lately she has been having some nonspecific symptoms blood pressure fluctuating sometimes it slow we try all different kind of maneuvers which included reducing beta-blocker which initially helped somewhat but then she started having difficulty again.  We also try to reduce the dose of diuretic because of kidney dysfunction dose is not working she still not doing well I brought her today to the office and in the physical exam I do not see anything striking she is scheduled to have an echocardiogram which will happen next week which will be appropriate move she is also scheduled to have blood work today which I will wait for.  No past medical history on file.  Past Surgical History:  Procedure Laterality Date  . CARDIAC CATHETERIZATION      Current Medications: Current Meds  Medication Sig  . ALPRAZolam (XANAX) 1 MG tablet Take 1 mg by mouth every 8 (eight) hours as needed. For anxiety   . atorvastatin (LIPITOR) 40 MG tablet Take 40 mg by mouth at bedtime.    . clopidogrel (PLAVIX) 75 MG tablet Take 1 tablet by mouth daily.  Marland Kitchen dexlansoprazole (DEXILANT) 60 MG capsule Take 1 tablet by mouth daily.  Marland Kitchen diltiazem (CARDIZEM CD) 120 MG 24 hr capsule Take 1 capsule (120 mg total) by mouth daily.  . fluticasone (FLONASE) 50 MCG/ACT nasal spray Place 2 sprays into the nose daily as needed. For allergies   . furosemide (LASIX) 40 MG tablet Take 40 mg in the morning and 20 mg in the evening  . hydroxypropyl methylcellulose (ISOPTO TEARS) 2.5 % ophthalmic solution Place 2 drops into  both eyes 3 (three) times daily as needed. For dry eyes   . levothyroxine (SYNTHROID, LEVOTHROID) 75 MCG tablet Take 1 tablet by mouth daily.  . metFORMIN (GLUCOPHAGE) 500 MG tablet Take 500 mg by mouth 2 (two) times daily with a meal.   . metoprolol tartrate (LOPRESSOR) 25 MG tablet Take 1 tablet (25 mg total) by mouth 2 (two) times daily. (Patient taking differently: Take 12.5 mg by mouth 2 (two) times daily. )  . nitroGLYCERIN (NITROSTAT) 0.4 MG SL tablet Place 1 tablet (0.4 mg total) under the tongue every 5 (five) minutes as needed.  . ondansetron (ZOFRAN) 4 MG tablet Take 4 mg by mouth 3 (three) times daily as needed. For nausea   . Oxycodone HCl 10 MG TABS Take 10 mg by mouth every 4 (four) hours as needed.  . pioglitazone (ACTOS) 45 MG tablet Take 45 mg by mouth daily.    . polyethylene glycol (MIRALAX / GLYCOLAX) packet Take 17 g by mouth every other day.       Allergies:   Propafenone; Codeine; Doxycycline; Isosorbide nitrate; Other; Procainamide; Rythmol [propafenone hcl]; and Sertraline   Social History   Socioeconomic History  . Marital status: Legally Separated    Spouse name: Not on file  . Number of children: Not on file  . Years of education: Not on file  . Highest education level: Not on file  Occupational History  .  Not on file  Social Needs  . Financial resource strain: Not on file  . Food insecurity:    Worry: Not on file    Inability: Not on file  . Transportation needs:    Medical: Not on file    Non-medical: Not on file  Tobacco Use  . Smoking status: Never Smoker  . Smokeless tobacco: Never Used  Substance and Sexual Activity  . Alcohol use: No    Alcohol/week: 0.0 standard drinks  . Drug use: No  . Sexual activity: Not on file  Lifestyle  . Physical activity:    Days per week: Not on file    Minutes per session: Not on file  . Stress: Not on file  Relationships  . Social connections:    Talks on phone: Not on file    Gets together: Not on file     Attends religious service: Not on file    Active member of club or organization: Not on file    Attends meetings of clubs or organizations: Not on file    Relationship status: Not on file  Other Topics Concern  . Not on file  Social History Narrative  . Not on file     Family History: [name] family history includes Heart disease in her mother. ROS:   Please see the history of present illness.    All 14 point review of systems negative except as described per history of present illness  EKGs/Labs/Other Studies Reviewed:      Recent Labs: 05/20/2018: ALT 9; BUN 32; Creatinine, Ser 1.82; Hemoglobin 11.1; NT-Pro BNP 549; Platelets 228; Potassium 5.0; Sodium 137  Recent Lipid Panel No results found for: CHOL, TRIG, HDL, CHOLHDL, VLDL, LDLCALC, LDLDIRECT  Physical Exam:    VS:  BP 122/64   Pulse 62   Ht '5\' 3"'  (1.6 m)   Wt 287 lb 6.4 oz (130.4 kg)   SpO2 99%   BMI 50.91 kg/m     Wt Readings from Last 3 Encounters:  06/03/18 287 lb 6.4 oz (130.4 kg)  05/30/18 286 lb (129.7 kg)  05/23/18 285 lb 3.2 oz (129.4 kg)     GEN:  Well nourished, well developed in no acute distress HEENT: Normal NECK: No JVD; No carotid bruits LYMPHATICS: No lymphadenopathy CARDIAC: RRR, no murmurs, no rubs, no gallops RESPIRATORY:  Clear to auscultation without rales, wheezing or rhonchi  ABDOMEN: Soft, non-tender, non-distended MUSCULOSKELETAL:  No edema; No deformity  SKIN: Warm and dry LOWER EXTREMITIES: no swelling NEUROLOGIC:  Alert and oriented x 3 PSYCHIATRIC:  Normal affect   ASSESSMENT:    1. Essential hypertension   2. Iron deficiency anemia, unspecified iron deficiency anemia type   3. Shortness of breath   4. Precordial pain   5. Ventricular tachycardia (paroxysmal) ([place])   6. Fatigue, unspecified type   7. Anemia, unspecified type   8. Type 2 diabetes mellitus without complication, without long-term current use of insulin ([facility])    PLAN:    In order of problems  listed above:  1. Essential hypertension blood pressure appears to be controlled with some days that her blood pressure is low. 2. Iron deficiency anemia.  We will check her for CBC.  I will also check B12 3. Shortness of breath multifactorial echocardiogram is pending I will check proBNP 4. Chest pain denies having any 5. Ventricular tachycardia denies having any dizziness or syncope. 6. Type 2 diabetes apparently stable and hemoglobin A1c is at reasonable level.  Overall very  puzzling picture I still do not know exactly why she is feeling that way will try to investigate this   Medication Adjustments/Labs and Tests Ordered: Current medicines are reviewed at length with the patient today.  Concerns regarding medicines are outlined above.  Orders Placed This Encounter  Procedures  . Comp Met (CMET)  . Pro b natriuretic peptide (BNP)  . CBC  . TSH  . B12   Medication changes: No orders of the defined types were placed in this encounter.   Signed, Park Liter, MD, [facility] 06/03/2018 10:52 AM    [place]

## 2018-06-03 NOTE — Patient Instructions (Signed)
Medication Instructions:  Your physician recommends that you continue on your current medications as directed. Please refer to the Current Medication list given to you today.  If you need a refill on your cardiac medications before your next appointment, please call your pharmacy.   Lab work: Your physician recommends that you return for lab work today: CMP, Bnp, CBC, Vb12, tsh  If you have labs (blood work) drawn today and your tests are completely normal, you will receive your results only by: Marland Kitchen MyChart Message (if you have MyChart) OR . A paper copy in the mail If you have any lab test that is abnormal or we need to change your treatment, we will call you to review the results.  Testing/Procedures: None.   Follow-Up: At [facility], you and your health needs are our priority.  As part of our continuing mission to provide you with exceptional heart care, we have created designated Provider Care Teams.  These Care Teams include your primary Cardiologist (physician) and Advanced Practice Providers (APPs -  Physician Assistants and Nurse Practitioners) who all work together to provide you with the care you need, when you need it. You will need a follow up appointment in 2 weeks.  Please call our office 2 months in advance to schedule this appointment.  You may see No primary care provider on file. or another member of our [employer] Provider Team in [place]: Shirlee More, MD . Jyl Heinz, MD  Any Other Special Instructions Will Be Listed Below (If Applicable).

## 2018-06-04 ENCOUNTER — Other Ambulatory Visit: Payer: Medicare Other

## 2018-06-04 ENCOUNTER — Encounter: Payer: Self-pay | Admitting: Cardiology

## 2018-06-04 LAB — COMPREHENSIVE METABOLIC PANEL
ALT: 5 IU/L (ref 0–32)
AST: 18 IU/L (ref 0–40)
Albumin/Globulin Ratio: 1.4 (ref 1.2–2.2)
Albumin: 4 g/dL (ref 3.8–4.8)
Alkaline Phosphatase: 82 IU/L (ref 39–117)
BUN/Creatinine Ratio: 17 (ref 12–28)
BUN: 31 mg/dL — ABNORMAL HIGH (ref 8–27)
Bilirubin Total: 0.3 mg/dL (ref 0.0–1.2)
CO2: 22 mmol/L (ref 20–29)
Calcium: 9.5 mg/dL (ref 8.7–10.3)
Chloride: 99 mmol/L (ref 96–106)
Creatinine, Ser: 1.78 mg/dL — ABNORMAL HIGH (ref 0.57–1.00)
GFR calc Af Amer: 34 mL/min/{1.73_m2} — ABNORMAL LOW (ref >59)
GFR calc non Af Amer: 30 mL/min/{1.73_m2} — ABNORMAL LOW (ref >59)
Globulin, Total: 2.9 g/dL (ref 1.5–4.5)
Glucose: 125 mg/dL — ABNORMAL HIGH (ref 65–99)
Potassium: 4.5 mmol/L (ref 3.5–5.2)
Sodium: 139 mmol/L (ref 134–144)
Total Protein: 6.9 g/dL (ref 6.0–8.5)

## 2018-06-04 LAB — CBC
Hematocrit: 32.9 % — ABNORMAL LOW (ref 34.0–46.6)
Hemoglobin: 10.6 g/dL — ABNORMAL LOW (ref 11.1–15.9)
MCH: 27.7 pg (ref 26.6–33.0)
MCHC: 32.2 g/dL (ref 31.5–35.7)
MCV: 86 fL (ref 79–97)
Platelets: 216 10*3/uL (ref 150–450)
RBC: 3.82 x10E6/uL (ref 3.77–5.28)
RDW: 14.1 % (ref 11.7–15.4)
WBC: 4.9 10*3/uL (ref 3.4–10.8)

## 2018-06-04 LAB — PRO B NATRIURETIC PEPTIDE: NT-Pro BNP: 577 pg/mL — ABNORMAL HIGH (ref 0–287)

## 2018-06-04 LAB — VITAMIN B12: Vitamin B-12: 365 pg/mL (ref 232–1245)

## 2018-06-04 LAB — TSH: TSH: 9.1 u[IU]/mL — ABNORMAL HIGH (ref 0.450–4.500)

## 2018-06-09 ENCOUNTER — Other Ambulatory Visit: Payer: Self-pay

## 2018-06-09 ENCOUNTER — Ambulatory Visit: Payer: Medicare Other | Admitting: Cardiology

## 2018-06-09 ENCOUNTER — Ambulatory Visit (INDEPENDENT_AMBULATORY_CARE_PROVIDER_SITE_OTHER): Payer: Medicare Other

## 2018-06-09 DIAGNOSIS — R0602 Shortness of breath: Secondary | ICD-10-CM

## 2018-06-09 DIAGNOSIS — I10 Essential (primary) hypertension: Secondary | ICD-10-CM | POA: Diagnosis not present

## 2018-06-09 NOTE — Progress Notes (Unsigned)
2D Echocardiogram performed  06/09/18 Cardell Peach RDCS, RVT

## 2018-06-10 ENCOUNTER — Telehealth: Payer: Self-pay | Admitting: Cardiology

## 2018-06-10 NOTE — Telephone Encounter (Signed)
Wants lab results

## 2018-06-10 NOTE — Telephone Encounter (Signed)
Yes, she can do all this, increase metoprolol She probably does not feel well because of hypothyriodism  So PMD shuld increase synthroid

## 2018-06-10 NOTE — Telephone Encounter (Signed)
Phoned patient, informed of lab results drawn 06/04/18 and that Dr. Agustin Cree wants her to speak with her PCP about high TSH/hypothyroidism. Pt states she is currently taking synthroid. Instructed to follow-up with her PCP for further treatment/medication adjustment. She verbalizes understanding and will do so.     Pt also reports feeling terrible. Since her metoprolol was decreased to 50 mg /day, she's been having lots of arrhythmias. She'd like to know if she could increase the dose up to 75 mg/day, hoping that might help lessen irregular HR.     She also reports having been directed to decrease lasix $RemoveBefore'80mg'VxgRTJUjKZxzk$ /day to $Remov'60mg'BCTmTj$ /day and if weight increases by more than 2 pounds in 3 days she can take an extra $Remove'20mg'CaANyvE$  lasix dose. She had gained 2 pounds, took an extra $Remove'20mg'PBomyfQ$  lasix that night and in the morning her weight increased by 1 pound 2 oz.

## 2018-06-10 NOTE — Telephone Encounter (Signed)
Phoned and informed patient that she can increase metoprolol to 75 mg/day, to continue to take extra $RemoveBe'20mg'xNYSqqUxq$  lasix at night for weight gain more than 2 pounds in 3 days and to follow-up with PCP for thyroid treatment. She verbalizes understanding and has no further questions/concerns.

## 2018-06-15 ENCOUNTER — Other Ambulatory Visit: Payer: Self-pay | Admitting: Cardiology

## 2018-06-15 DIAGNOSIS — R072 Precordial pain: Secondary | ICD-10-CM | POA: Diagnosis not present

## 2018-06-15 DIAGNOSIS — R002 Palpitations: Secondary | ICD-10-CM | POA: Diagnosis not present

## 2018-06-15 DIAGNOSIS — R079 Chest pain, unspecified: Secondary | ICD-10-CM | POA: Diagnosis not present

## 2018-06-15 DIAGNOSIS — R001 Bradycardia, unspecified: Secondary | ICD-10-CM | POA: Diagnosis not present

## 2018-06-15 DIAGNOSIS — R0989 Other specified symptoms and signs involving the circulatory and respiratory systems: Secondary | ICD-10-CM | POA: Diagnosis not present

## 2018-06-16 ENCOUNTER — Telehealth: Payer: Self-pay | Admitting: Cardiology

## 2018-06-16 DIAGNOSIS — M1990 Unspecified osteoarthritis, unspecified site: Secondary | ICD-10-CM | POA: Diagnosis present

## 2018-06-16 DIAGNOSIS — R0602 Shortness of breath: Secondary | ICD-10-CM | POA: Diagnosis not present

## 2018-06-16 DIAGNOSIS — I10 Essential (primary) hypertension: Secondary | ICD-10-CM | POA: Diagnosis not present

## 2018-06-16 DIAGNOSIS — R0789 Other chest pain: Secondary | ICD-10-CM | POA: Diagnosis not present

## 2018-06-16 DIAGNOSIS — K219 Gastro-esophageal reflux disease without esophagitis: Secondary | ICD-10-CM | POA: Diagnosis present

## 2018-06-16 DIAGNOSIS — Z79899 Other long term (current) drug therapy: Secondary | ICD-10-CM | POA: Diagnosis not present

## 2018-06-16 DIAGNOSIS — R001 Bradycardia, unspecified: Secondary | ICD-10-CM | POA: Diagnosis present

## 2018-06-16 DIAGNOSIS — N183 Chronic kidney disease, stage 3 (moderate): Secondary | ICD-10-CM | POA: Diagnosis not present

## 2018-06-16 DIAGNOSIS — Z8673 Personal history of transient ischemic attack (TIA), and cerebral infarction without residual deficits: Secondary | ICD-10-CM | POA: Diagnosis not present

## 2018-06-16 DIAGNOSIS — E669 Obesity, unspecified: Secondary | ICD-10-CM | POA: Diagnosis present

## 2018-06-16 DIAGNOSIS — Z880 Allergy status to penicillin: Secondary | ICD-10-CM | POA: Diagnosis not present

## 2018-06-16 DIAGNOSIS — Z881 Allergy status to other antibiotic agents status: Secondary | ICD-10-CM | POA: Diagnosis not present

## 2018-06-16 DIAGNOSIS — R072 Precordial pain: Secondary | ICD-10-CM | POA: Diagnosis not present

## 2018-06-16 DIAGNOSIS — F418 Other specified anxiety disorders: Secondary | ICD-10-CM | POA: Diagnosis present

## 2018-06-16 DIAGNOSIS — R079 Chest pain, unspecified: Secondary | ICD-10-CM | POA: Diagnosis not present

## 2018-06-16 DIAGNOSIS — Z888 Allergy status to other drugs, medicaments and biological substances status: Secondary | ICD-10-CM | POA: Diagnosis not present

## 2018-06-16 DIAGNOSIS — I129 Hypertensive chronic kidney disease with stage 1 through stage 4 chronic kidney disease, or unspecified chronic kidney disease: Secondary | ICD-10-CM | POA: Diagnosis not present

## 2018-06-16 DIAGNOSIS — E1122 Type 2 diabetes mellitus with diabetic chronic kidney disease: Secondary | ICD-10-CM | POA: Diagnosis not present

## 2018-06-16 DIAGNOSIS — E785 Hyperlipidemia, unspecified: Secondary | ICD-10-CM | POA: Diagnosis present

## 2018-06-16 DIAGNOSIS — E039 Hypothyroidism, unspecified: Secondary | ICD-10-CM | POA: Diagnosis present

## 2018-06-16 DIAGNOSIS — E1169 Type 2 diabetes mellitus with other specified complication: Secondary | ICD-10-CM | POA: Diagnosis not present

## 2018-06-16 NOTE — Telephone Encounter (Signed)
Yes, I Know, they called me at 2 AM tonight from [facility], I spoke to cardiologist at the hospital in AM

## 2018-06-16 NOTE — Telephone Encounter (Signed)
Patient's daughter called to let Dr Agustin Cree know she has been admitted to [facility].  Her heart rate was staying in 30's and 40's.  She is have a stress test done at the moment.  She had an appt scheduled for tomorrow 6.16.2020 to review echo results so she is upset she had to cancel that appt because she is anxious to know the results, especially now.  I told her as soon as Ms Paddack gets out of [facility] to call us and we would schedule her as soon as possible to review these results

## 2018-06-17 ENCOUNTER — Encounter (HOSPITAL_COMMUNITY)
Admission: AD | Disposition: A | Discharge status: Home / Self Care | Payer: Self-pay | Source: Other Acute Inpatient Hospital | Attending: Cardiology

## 2018-06-17 ENCOUNTER — Telehealth: Payer: Medicare Other | Admitting: Cardiology

## 2018-06-17 ENCOUNTER — Inpatient Hospital Stay (HOSPITAL_COMMUNITY)
Admission: AD | Admit: 2018-06-17 | Discharge: 2018-06-18 | DRG: 287 | Disposition: A | Discharge status: Home / Self Care | Payer: Medicare Other | Source: Other Acute Inpatient Hospital | Attending: Cardiology | Admitting: Cardiology

## 2018-06-17 ENCOUNTER — Encounter (HOSPITAL_COMMUNITY): Payer: Self-pay

## 2018-06-17 ENCOUNTER — Other Ambulatory Visit: Payer: Self-pay

## 2018-06-17 DIAGNOSIS — I130 Hypertensive heart and chronic kidney disease with heart failure and stage 1 through stage 4 chronic kidney disease, or unspecified chronic kidney disease: Secondary | ICD-10-CM | POA: Diagnosis present

## 2018-06-17 DIAGNOSIS — E039 Hypothyroidism, unspecified: Secondary | ICD-10-CM | POA: Diagnosis present

## 2018-06-17 DIAGNOSIS — I10 Essential (primary) hypertension: Secondary | ICD-10-CM | POA: Diagnosis present

## 2018-06-17 DIAGNOSIS — Z1159 Encounter for screening for other viral diseases: Secondary | ICD-10-CM | POA: Diagnosis not present

## 2018-06-17 DIAGNOSIS — I5032 Chronic diastolic (congestive) heart failure: Secondary | ICD-10-CM | POA: Diagnosis present

## 2018-06-17 DIAGNOSIS — I200 Unstable angina: Secondary | ICD-10-CM | POA: Diagnosis present

## 2018-06-17 DIAGNOSIS — L8990 Pressure ulcer of unspecified site, unspecified stage: Secondary | ICD-10-CM

## 2018-06-17 DIAGNOSIS — I472 Ventricular tachycardia, unspecified: Secondary | ICD-10-CM

## 2018-06-17 DIAGNOSIS — Z7902 Long term (current) use of antithrombotics/antiplatelets: Secondary | ICD-10-CM

## 2018-06-17 DIAGNOSIS — Z888 Allergy status to other drugs, medicaments and biological substances status: Secondary | ICD-10-CM

## 2018-06-17 DIAGNOSIS — R0789 Other chest pain: Secondary | ICD-10-CM | POA: Diagnosis not present

## 2018-06-17 DIAGNOSIS — Z87892 Personal history of anaphylaxis: Secondary | ICD-10-CM | POA: Diagnosis not present

## 2018-06-17 DIAGNOSIS — N189 Chronic kidney disease, unspecified: Secondary | ICD-10-CM | POA: Diagnosis present

## 2018-06-17 DIAGNOSIS — G4733 Obstructive sleep apnea (adult) (pediatric): Secondary | ICD-10-CM | POA: Diagnosis present

## 2018-06-17 DIAGNOSIS — Z79890 Hormone replacement therapy (postmenopausal): Secondary | ICD-10-CM | POA: Diagnosis not present

## 2018-06-17 DIAGNOSIS — I4729 Other ventricular tachycardia: Secondary | ICD-10-CM

## 2018-06-17 DIAGNOSIS — Z6841 Body Mass Index (BMI) 40.0 and over, adult: Secondary | ICD-10-CM | POA: Diagnosis not present

## 2018-06-17 DIAGNOSIS — Z8673 Personal history of transient ischemic attack (TIA), and cerebral infarction without residual deficits: Secondary | ICD-10-CM | POA: Diagnosis not present

## 2018-06-17 DIAGNOSIS — Z8249 Family history of ischemic heart disease and other diseases of the circulatory system: Secondary | ICD-10-CM | POA: Diagnosis not present

## 2018-06-17 DIAGNOSIS — Z79899 Other long term (current) drug therapy: Secondary | ICD-10-CM | POA: Diagnosis not present

## 2018-06-17 DIAGNOSIS — R001 Bradycardia, unspecified: Secondary | ICD-10-CM | POA: Diagnosis present

## 2018-06-17 DIAGNOSIS — E1122 Type 2 diabetes mellitus with diabetic chronic kidney disease: Secondary | ICD-10-CM | POA: Diagnosis present

## 2018-06-17 DIAGNOSIS — Z885 Allergy status to narcotic agent status: Secondary | ICD-10-CM | POA: Diagnosis not present

## 2018-06-17 DIAGNOSIS — I441 Atrioventricular block, second degree: Secondary | ICD-10-CM | POA: Diagnosis present

## 2018-06-17 DIAGNOSIS — E119 Type 2 diabetes mellitus without complications: Secondary | ICD-10-CM

## 2018-06-17 DIAGNOSIS — R002 Palpitations: Secondary | ICD-10-CM

## 2018-06-17 HISTORY — DX: Pressure ulcer of unspecified site, unspecified stage: L89.90

## 2018-06-17 HISTORY — PX: LEFT HEART CATH AND CORONARY ANGIOGRAPHY: CATH118249

## 2018-06-17 HISTORY — DX: Unstable angina: I20.0

## 2018-06-17 LAB — GLUCOSE, CAPILLARY
Glucose-Capillary: 110 mg/dL — ABNORMAL HIGH (ref 70–99)
Glucose-Capillary: 125 mg/dL — ABNORMAL HIGH (ref 70–99)
Glucose-Capillary: 96 mg/dL (ref 70–99)
Glucose-Capillary: 155 mg/dL — ABNORMAL HIGH (ref 70–99)

## 2018-06-17 LAB — CBC
HCT: 32.6 % — ABNORMAL LOW (ref 36.0–46.0)
Hemoglobin: 10.3 g/dL — ABNORMAL LOW (ref 12.0–15.0)
MCH: 28.1 pg (ref 26.0–34.0)
MCHC: 31.6 g/dL (ref 30.0–36.0)
MCV: 89.1 fL (ref 80.0–100.0)
Platelets: 188 10*3/uL (ref 150–400)
RBC: 3.66 MIL/uL — ABNORMAL LOW (ref 3.87–5.11)
RDW: 15.6 % — ABNORMAL HIGH (ref 11.5–15.5)
WBC: 5.2 10*3/uL (ref 4.0–10.5)
nRBC: 0 % (ref 0.0–0.2)

## 2018-06-17 LAB — BASIC METABOLIC PANEL
Anion gap: 8 (ref 5–15)
BUN: 19 mg/dL (ref 8–23)
CO2: 25 mmol/L (ref 22–32)
Calcium: 8.9 mg/dL (ref 8.9–10.3)
Chloride: 107 mmol/L (ref 98–111)
Creatinine, Ser: 1.58 mg/dL — ABNORMAL HIGH (ref 0.44–1.00)
GFR calc Af Amer: 40 mL/min — ABNORMAL LOW (ref >60)
GFR calc non Af Amer: 34 mL/min — ABNORMAL LOW (ref >60)
Glucose, Bld: 134 mg/dL — ABNORMAL HIGH (ref 70–99)
Potassium: 4.3 mmol/L (ref 3.5–5.1)
Sodium: 140 mmol/L (ref 135–145)

## 2018-06-17 LAB — MAGNESIUM: Magnesium: 2.1 mg/dL (ref 1.7–2.4)

## 2018-06-17 LAB — FERRITIN: Ferritin: 17 ng/mL (ref 11–307)

## 2018-06-17 LAB — HEMOGLOBIN A1C
Hgb A1c MFr Bld: 6 % — ABNORMAL HIGH (ref 4.8–5.6)
Mean Plasma Glucose: 125.5 mg/dL

## 2018-06-17 LAB — SARS CORONAVIRUS 2: SARS Coronavirus 2: NOT DETECTED

## 2018-06-17 SURGERY — LEFT HEART CATH AND CORONARY ANGIOGRAPHY
Anesthesia: LOCAL

## 2018-06-17 MED ORDER — SODIUM CHLORIDE 0.9 % IV SOLN: INTRAVENOUS | Status: AC

## 2018-06-17 MED ORDER — MIDAZOLAM HCL 2 MG/2ML IJ SOLN
INTRAMUSCULAR | Status: DC | PRN
  Administered 2018-06-17 (×2): 1 mg via INTRAVENOUS

## 2018-06-17 MED ORDER — PANTOPRAZOLE SODIUM 40 MG PO TBEC
40.0000 mg | DELAYED_RELEASE_TABLET | Freq: Every day | ORAL | Status: DC
  Administered 2018-06-17 – 2018-06-18 (×2): 40 mg via ORAL
  Filled 2018-06-17 (×2): qty 1

## 2018-06-17 MED ORDER — SODIUM CHLORIDE 0.9% FLUSH: 3.0000 mL | INTRAVENOUS | Status: DC | PRN

## 2018-06-17 MED ORDER — CLOPIDOGREL BISULFATE 75 MG PO TABS
75.0000 mg | ORAL_TABLET | Freq: Every day | ORAL | Status: DC
  Administered 2018-06-17 – 2018-06-18 (×2): 75 mg via ORAL
  Filled 2018-06-17 (×2): qty 1

## 2018-06-17 MED ORDER — FENTANYL CITRATE (PF) 100 MCG/2ML IJ SOLN
INTRAMUSCULAR | Status: AC
  Filled 2018-06-17: qty 2

## 2018-06-17 MED ORDER — ASPIRIN 325 MG PO TABS
325.0000 mg | ORAL_TABLET | Freq: Once | ORAL | Status: AC
  Administered 2018-06-17: 325 mg via ORAL
  Filled 2018-06-17: qty 1

## 2018-06-17 MED ORDER — VERAPAMIL HCL 2.5 MG/ML IV SOLN
INTRAVENOUS | Status: DC | PRN
  Administered 2018-06-17: 10 mL via INTRA_ARTERIAL

## 2018-06-17 MED ORDER — SODIUM CHLORIDE 0.9% FLUSH
3.0000 mL | Freq: Two times a day (BID) | INTRAVENOUS | Status: DC
  Administered 2018-06-18 (×2): 3 mL via INTRAVENOUS

## 2018-06-17 MED ORDER — LIDOCAINE HCL (PF) 1 % IJ SOLN
INTRAMUSCULAR | Status: DC | PRN
  Administered 2018-06-17: 2 mL

## 2018-06-17 MED ORDER — ONDANSETRON HCL 4 MG PO TABS: 4.0000 mg | ORAL_TABLET | Freq: Three times a day (TID) | ORAL | Status: DC | PRN

## 2018-06-17 MED ORDER — NITROGLYCERIN 0.4 MG SL SUBL: 0.4000 mg | SUBLINGUAL_TABLET | SUBLINGUAL | Status: DC | PRN

## 2018-06-17 MED ORDER — HEPARIN SODIUM (PORCINE) 1000 UNIT/ML IJ SOLN
INTRAMUSCULAR | Status: DC | PRN
  Administered 2018-06-17: 6500 [IU] via INTRAVENOUS

## 2018-06-17 MED ORDER — POLYVINYL ALCOHOL 1.4 % OP SOLN: 2.0000 [drp] | Freq: Three times a day (TID) | OPHTHALMIC | Status: DC | PRN

## 2018-06-17 MED ORDER — ENOXAPARIN SODIUM 40 MG/0.4ML ~~LOC~~ SOLN
40.0000 mg | SUBCUTANEOUS | Status: DC
  Administered 2018-06-18: 40 mg via SUBCUTANEOUS
  Filled 2018-06-17: qty 0.4

## 2018-06-17 MED ORDER — ENOXAPARIN SODIUM 40 MG/0.4ML ~~LOC~~ SOLN: 40.0000 mg | SUBCUTANEOUS | Status: DC

## 2018-06-17 MED ORDER — HEPARIN (PORCINE) IN NACL 1000-0.9 UT/500ML-% IV SOLN
INTRAVENOUS | Status: AC
  Filled 2018-06-17: qty 1000

## 2018-06-17 MED ORDER — LEVOTHYROXINE SODIUM 75 MCG PO TABS
75.0000 ug | ORAL_TABLET | Freq: Every day | ORAL | Status: DC
  Administered 2018-06-17 – 2018-06-18 (×2): 75 ug via ORAL
  Filled 2018-06-17 (×2): qty 1

## 2018-06-17 MED ORDER — HEPARIN (PORCINE) IN NACL 1000-0.9 UT/500ML-% IV SOLN
INTRAVENOUS | Status: DC | PRN
  Administered 2018-06-17 (×2): 500 mL

## 2018-06-17 MED ORDER — ATORVASTATIN CALCIUM 40 MG PO TABS
40.0000 mg | ORAL_TABLET | Freq: Every day | ORAL | Status: DC
  Administered 2018-06-17: 40 mg via ORAL
  Filled 2018-06-17: qty 1

## 2018-06-17 MED ORDER — FUROSEMIDE 40 MG PO TABS: 40.0000 mg | ORAL_TABLET | Freq: Every day | ORAL | Status: DC

## 2018-06-17 MED ORDER — MIDAZOLAM HCL 2 MG/2ML IJ SOLN
INTRAMUSCULAR | Status: AC
  Filled 2018-06-17: qty 2

## 2018-06-17 MED ORDER — FENTANYL CITRATE (PF) 100 MCG/2ML IJ SOLN
INTRAMUSCULAR | Status: DC | PRN
  Administered 2018-06-17 (×2): 25 ug via INTRAVENOUS

## 2018-06-17 MED ORDER — FLUTICASONE PROPIONATE 50 MCG/ACT NA SUSP
2.0000 | Freq: Every day | NASAL | Status: DC
  Filled 2018-06-17: qty 16

## 2018-06-17 MED ORDER — INSULIN GLARGINE 100 UNIT/ML ~~LOC~~ SOLN
10.0000 [IU] | Freq: Every day | SUBCUTANEOUS | Status: DC
  Administered 2018-06-17: 10 [IU] via SUBCUTANEOUS
  Filled 2018-06-17 (×2): qty 0.1

## 2018-06-17 MED ORDER — INSULIN ASPART 100 UNIT/ML ~~LOC~~ SOLN
0.0000 [IU] | Freq: Three times a day (TID) | SUBCUTANEOUS | Status: DC
  Administered 2018-06-17 – 2018-06-18 (×3): 1 [IU] via SUBCUTANEOUS

## 2018-06-17 MED ORDER — SODIUM CHLORIDE 0.9 % IV SOLN: 250.0000 mL | INTRAVENOUS | Status: DC | PRN

## 2018-06-17 MED ORDER — OXYCODONE HCL 5 MG PO TABS: 10.0000 mg | ORAL_TABLET | Freq: Three times a day (TID) | ORAL | Status: DC | PRN

## 2018-06-17 MED ORDER — ASPIRIN 81 MG PO CHEW
81.0000 mg | CHEWABLE_TABLET | Freq: Every day | ORAL | Status: DC
  Administered 2018-06-18: 81 mg via ORAL
  Filled 2018-06-17: qty 1

## 2018-06-17 MED ORDER — LIDOCAINE HCL (PF) 1 % IJ SOLN
INTRAMUSCULAR | Status: AC
  Filled 2018-06-17: qty 30

## 2018-06-17 MED ORDER — VERAPAMIL HCL 2.5 MG/ML IV SOLN
INTRAVENOUS | Status: AC
  Filled 2018-06-17: qty 2

## 2018-06-17 MED ORDER — HEPARIN SODIUM (PORCINE) 1000 UNIT/ML IJ SOLN
INTRAMUSCULAR | Status: AC
  Filled 2018-06-17: qty 1

## 2018-06-17 MED ORDER — FUROSEMIDE 20 MG PO TABS: 20.0000 mg | ORAL_TABLET | Freq: Every day | ORAL | Status: DC

## 2018-06-17 MED ORDER — SODIUM CHLORIDE 0.9 % WEIGHT BASED INFUSION: 1.0000 mL/kg/h | INTRAVENOUS | Status: AC

## 2018-06-17 MED ORDER — ASPIRIN 81 MG PO CHEW: 81.0000 mg | CHEWABLE_TABLET | Freq: Every day | ORAL | Status: DC

## 2018-06-17 MED ORDER — ALPRAZOLAM 0.5 MG PO TABS: 1.0000 mg | ORAL_TABLET | Freq: Three times a day (TID) | ORAL | Status: DC | PRN

## 2018-06-17 MED ORDER — POLYETHYLENE GLYCOL 3350 17 G PO PACK: 17.0000 g | PACK | Freq: Every day | ORAL | Status: DC | PRN

## 2018-06-17 MED ORDER — ACETAMINOPHEN 325 MG PO TABS
650.0000 mg | ORAL_TABLET | ORAL | Status: DC | PRN
  Administered 2018-06-17 – 2018-06-18 (×2): 650 mg via ORAL
  Filled 2018-06-17 (×2): qty 2

## 2018-06-17 MED ORDER — IOHEXOL 350 MG/ML SOLN
INTRAVENOUS | Status: DC | PRN
  Administered 2018-06-17: 16:00:00 50 mL via INTRACARDIAC

## 2018-06-17 SURGICAL SUPPLY — 13 items
CATH 5FR JL3.5 JR4 ANG PIG MP (CATHETERS) ×1 IMPLANT
COVER DOME SNAP 22 D (MISCELLANEOUS) ×1 IMPLANT
DEVICE RAD COMP TR BAND LRG (VASCULAR PRODUCTS) ×1 IMPLANT
GLIDESHEATH SLEND SS 6F .021 (SHEATH) ×1 IMPLANT
GUIDEWIRE INQWIRE 1.5J.035X260 (WIRE) IMPLANT
HOVERMATT SINGLE USE (MISCELLANEOUS) ×1 IMPLANT
INQWIRE 1.5J .035X260CM (WIRE) ×2
KIT HEART LEFT (KITS) ×2 IMPLANT
PACK CARDIAC CATHETERIZATION (CUSTOM PROCEDURE TRAY) ×2 IMPLANT
SHEATH PROBE COVER 6X72 (BAG) ×1 IMPLANT
TRANSDUCER W/STOPCOCK (MISCELLANEOUS) ×2 IMPLANT
TUBING CIL FLEX 10 FLL-RA (TUBING) ×2 IMPLANT
WIRE HI TORQ VERSACORE-J 145CM (WIRE) ×1 IMPLANT

## 2018-06-17 NOTE — Interval H&P Note (Signed)
History and Physical Interval Note:  06/17/2018 3:40 PM  Vickie Ferguson  has presented today for surgery, with the diagnosis of unstable angina.  The various methods of treatment have been discussed with the patient and family. After consideration of risks, benefits and other options for treatment, the patient has consented to  Procedure(s): LEFT HEART CATH AND CORONARY ANGIOGRAPHY (N/A) as a surgical intervention.  [name] history has been reviewed, patient examined, no change in status, stable for surgery.  I have reviewed the patient's chart and labs.  Questions were answered to the patient's satisfaction.   Cath Lab Visit (complete for each Cath Lab visit)  Clinical Evaluation Leading to the Procedure:   ACS: Yes.    Non-ACS:    Anginal Classification: CCS II  Anti-ischemic medical therapy: Maximal Therapy (2 or more classes of medications)  Non-Invasive Test Results: Intermediate-risk stress test findings: cardiac mortality 1-3%/year  Prior CABG: No previous CABG        Collier Salina [facility] 06/17/2018 3:40 PM

## 2018-06-17 NOTE — H&P (View-Only) (Signed)
The patient has been seen in conjunction with Reino Bellis, NP. All aspects of care have been considered and discussed. The patient has been personally interviewed, examined, and all clinical data has been reviewed.   If cath does not reveal significant disease, consider LOOP to identify causal arrhythmia.  COVID - 19 negative.  The patient was counseled to undergo left heart catheterization, coronary angiography, and possible percutaneous coronary intervention with stent implantation. The procedural risks and benefits were discussed in detail. The risks discussed included death, stroke, myocardial infarction, life-threatening bleeding, limb ischemia, kidney injury, allergy, and possible emergency cardiac surgery. The risk of these significant complications were estimated to occur less than 1% of the time. After discussion, the patient has agreed to proceed.   Progress Note  Patient Name: Vickie Ferguson Date of Encounter: 06/17/2018  Primary Cardiologist: No primary care provider on file.   Subjective   No chest pain this morning. Anxious about the hospital stay.   Inpatient Medications    Scheduled Meds: . [START ON 06/18/2018] aspirin  81 mg Oral Daily  . aspirin  325 mg Oral Once  . atorvastatin  40 mg Oral QHS  . clopidogrel  75 mg Oral Daily  . enoxaparin (LOVENOX) injection  40 mg Subcutaneous Q24H  . fluticasone  2 spray Each Nare Daily  . furosemide  20 mg Oral Q1500  . furosemide  40 mg Oral Daily  . levothyroxine  75 mcg Oral Q0600  . pantoprazole  40 mg Oral Daily   Continuous Infusions:  PRN Meds: acetaminophen, ALPRAZolam, nitroGLYCERIN, ondansetron, oxyCODONE, polyethylene glycol, polyvinyl alcohol   Vital Signs    Vitals:   06/17/18 0246 06/17/18 0535  BP: (!) 119/54 121/76  Pulse: 93 86  Resp: 16   Temp: 97.8 F (36.6 C) 98 F (36.7 C)  TempSrc: Oral Oral  SpO2: 100% 100%  Weight: 131.1 kg   Height: $Remove'5\' 3"'aUgpyCz$  (1.6 m)     Intake/Output Summary  (Last 24 hours) at 06/17/2018 0902 Last data filed at 06/17/2018 8676 Gross per 24 hour  Intake 0 ml  Output 400 ml  Net -400 ml   Last 3 Weights 06/17/2018 06/03/2018 05/30/2018  Weight (lbs) 289 lb 0.4 oz 287 lb 6.4 oz 286 lb  Weight (kg) 131.1 kg 130.364 kg 129.729 kg      Telemetry    SB with intermittent Mobitz type II - Personally Reviewed  ECG    N/a - Personally Reviewed  Physical Exam   GEN: No acute distress. Obese older WF Neck: Difficult to assess JVD 2/2 to body habitus.  Cardiac: RRR, + systolic murmur, rubs, or gallops.  Respiratory: Clear to auscultation bilaterally. GI: Soft, nontender, non-distended  MS: No edema; No deformity. Neuro:  Nonfocal  Psych: Normal affect   Labs    ChemistryNo results for input(s): NA, K, CL, CO2, GLUCOSE, BUN, CREATININE, CALCIUM, PROT, ALBUMIN, AST, ALT, ALKPHOS, BILITOT, GFRNONAA, GFRAA, ANIONGAP in the last 168 hours.   HematologyNo results for input(s): WBC, RBC, HGB, HCT, MCV, MCH, MCHC, RDW, PLT in the last 168 hours.  Cardiac EnzymesNo results for input(s): TROPONINI in the last 168 hours. No results for input(s): TROPIPOC in the last 168 hours.   BNPNo results for input(s): BNP, PROBNP in the last 168 hours.   DDimer No results for input(s): DDIMER in the last 168 hours.   Radiology    No results found.  Cardiac Studies   TTE: 06/09/18  IMPRESSIONS    1. The  left ventricle has normal systolic function, with an ejection fraction of 55-60%. The cavity size was normal. Left ventricular diastolic function could not be evaluated.  2. The right ventricle has normal systolic function. The cavity was normal. There is no increase in right ventricular wall thickness.  3. Mitral valve regurgitation is mild to moderate by color flow Doppler.  4. The tricuspid valve is grossly normal.  5. The aortic valve is grossly normal. Moderate sclerosis of the aortic valve. Aortic valve regurgitation is trivial by color flow Doppler.    Patient Profile     63 y.o. female with history of AVNRT ablation c/b RBBB (1996), OSA not on CPAP, type 2 DM, IDA, HFpEF, morbid obesity, CKD, and TIA on Plavix, transferred from [facility] for a positive stress test and bradycardia after initially presenting there for chest pain.  Assessment & Plan    1. Unstable angina with intermediate risk stress test:  Chest pain was a little atypical, though she does have some exertional symptoms and clearly abnormal stress test suggestive of at least moderate ischemic territory.Troponin at [facility] negative x 3. Will need to undergo cardiac cath to define anatomy. Labs pending this morning. Cr was around 1.7 while at [facility]. - The patient understands that risks included but are not limited to stroke (1 in 1000), death (1 in 1000), kidney failure [usually temporary] (1 in 500), bleeding (1 in 200), allergic reaction [possibly serious] (1 in 200).  - on Plavix, ASA, and statin - Rapid COVID ordered - will start IVFs at 75 hr for now as she has received KVO prior to admission, until Cr known.   2. Second degree Mobitz II AVB:  Noted on our telemetry while she was awake. Metoprolol had just been held prior to admission to [facility] and she remained on diltiazem. She reports NOT getting any cardiac meds yesterday. Last doses of Dilt per her report was Sunday night. Planned to undergo ischemic evaluation as above.  - Discontinue diltiazem and metoprolol - would not restart - Ischemic evaluation as above - If persistent symptomatic bradycardia then she may need to be considered for PPM. - follow on telemetry  3. HFpEF: overall seems euvolemic. Symptoms confounded by problems above.  - hold lasix for now  4. CKD: progressively worsening renal function over last few years, currently stable. - holding lasix at this time - BMET pending this morning. Cr around 1.7 while at [facility].  5. OSA: reports intolerance to mask years ago - Consider referral to sleep  medicine at discharge  6. Type 2 DM:  - Hold metformin and Actos while inpatient - Basal insulin + SSI for now - Check A1c, last in 2012  7. Hypothyroidism: recent TSH was 6.7. Normal free T4 and T3 at OSH. - Continue Synthroid 75 mcg, timed 6am  8. IDA: no prior ferritin that I can see. Hgb 10.6. May be contributing to symptoms. - check ferritin    For questions or updates, please contact [place] Please consult [email or web address] for contact info under        Signed, Reino Bellis, NP  06/17/2018, 9:02 AM

## 2018-06-17 NOTE — Progress Notes (Signed)
Patient has 2 wallets sealed with Security  Bag # [id number]. Sealed in front of the patient.

## 2018-06-17 NOTE — Progress Notes (Signed)
   No obstructive CAD. False positive nuclear stress test.  Needs 30 day monitor to r/o arrhythmia.  Discharge okay now or AM

## 2018-06-17 NOTE — H&P (Signed)
Cardiology History & Physical    Patient ID: Vickie Ferguson MRN: [id number], DOB/AGE: Sep 23, 1955   Admit date: 06/17/2018  Primary Physician: Nicoletta Dress, MD Primary Cardiologist: No primary care provider on file.  Patient Profile    This is a 63 year old woman with history of AVNRT ablation c/b RBBB (1996), OSA not on CPAP, type 2 DM, IDA, HFpEF, morbid obesity, CKD, and TIA on Plavix, transferred from [facility] for a positive stress test and bradycardia after initially presenting there for chest pain.  History of Present Illness   Currently, she reports 3-4 month history of intermittent chest discomfort occurring at rest, though per notes from Dr. Agustin Cree, seems to be more long-standing. She went to [facility] on 6/14 after experiencing a more severe and prolonged episode of chest heaviness followed by shortness of breath. She has been having less severe episodes with increasing frequency over the past few months, particularly while she is showering. She has had to stop showering and rest as she is unable to stand up long enough due to fatigue and dyspnea. She has found that her BP and pulse when getting out of the shower are commonly both low (ie HR 40s, BP 90s/50s) and that her metoprolol has been decreased. She frequently feels that her heart is "out of rhythm" which she really cannot describe, saying it is both fast and slow. While at [facility], she described the chest pain as a little worse laying back and associated with nausea and diaphoresis. Her symptoms improved with nitroglycerin in the emergency department and now have resolved, though she does still feel her heart is "out of rhythm" at times.   Initial labs were notable for negative serial troponins (undetectable), pro-BNP 753, Cr 1.7, hgb 10.9, plt 207. ECG showed NSR with RBBB and borderline 1st degree AVB. She was continued on Plavix. She was not treated with aspirin due to intolerance, though I clarified  this with her and she reports having some blood in her stool while on aspirin 10 years ago with no issues since then. She was intermittently bradycardic to the 40s so metoprolol was held and she was continued on diltiazem. She underwent Lexiscan nuclear stress that was reported to show an inferolateral moderate to large reversible perfusion defect, as well as a small fixed anterior defect. She was seen by cardiology there and determined to need inpatient left heart cath, then transferred here for further management. She also was given IV fluids due to her CKD in anticipation of this procedure. Fortunately, she currently denies any dyspnea or significant orthopnea.   Past Medical History   History reviewed. No pertinent past medical history.  Past Surgical History:  Procedure Laterality Date  . CARDIAC CATHETERIZATION       Allergies Allergies  Allergen Reactions  . Propafenone Anaphylaxis    bradycardia bradycardia bradycardia   . Codeine Nausea And Vomiting  . Doxycycline   . Isosorbide Nitrate   . Other   . Procainamide Swelling  . Rythmol [Propafenone Hcl] Other (See Comments)    bradycardia  . Sertraline     Home Medications    Prior to Admission medications   Medication Sig Start Date End Date Taking? Authorizing Provider  ALPRAZolam Duanne Moron) 1 MG tablet Take 1 mg by mouth every 8 (eight) hours as needed. For anxiety     [provider]  atorvastatin (LIPITOR) 40 MG tablet Take 40 mg by mouth at bedtime.      [provider]  clopidogrel (PLAVIX) 75 MG tablet Take 1 tablet by mouth daily. 01/04/17   [provider]  dexlansoprazole (DEXILANT) 60 MG capsule Take 1 tablet by mouth daily. 01/04/17   [provider]  diltiazem (CARDIZEM CD) 120 MG 24 hr capsule Take 1 capsule (120 mg total) by mouth daily. 01/27/18   Park Liter, MD  fluticasone (FLONASE) 50 MCG/ACT nasal spray Place 2 sprays into the nose daily as needed. For allergies      [provider]  furosemide (LASIX) 40 MG tablet Take 40 mg in the morning and 20 mg in the evening 05/23/18   Park Liter, MD  hydroxypropyl methylcellulose (ISOPTO TEARS) 2.5 % ophthalmic solution Place 2 drops into both eyes 3 (three) times daily as needed. For dry eyes     [provider]  levothyroxine (SYNTHROID, LEVOTHROID) 75 MCG tablet Take 1 tablet by mouth daily. 12/29/16   [provider]  metFORMIN (GLUCOPHAGE) 500 MG tablet Take 500 mg by mouth 2 (two) times daily with a meal.     [provider]  metoprolol tartrate (LOPRESSOR) 25 MG tablet Take 1 tablet (25 mg total) by mouth 2 (two) times daily. Patient taking differently: Take 12.5 mg by mouth 2 (two) times daily.  05/20/18   Park Liter, MD  nitroGLYCERIN (NITROSTAT) 0.4 MG SL tablet Place 1 tablet (0.4 mg total) under the tongue every 5 (five) minutes as needed. 12/13/17   Park Liter, MD  ondansetron (ZOFRAN) 4 MG tablet Take 4 mg by mouth 3 (three) times daily as needed. For nausea     [provider]  Oxycodone HCl 10 MG TABS Take 10 mg by mouth every 4 (four) hours as needed.    [provider]  pioglitazone (ACTOS) 45 MG tablet Take 45 mg by mouth daily.      [provider]  polyethylene glycol (MIRALAX / GLYCOLAX) packet Take 17 g by mouth every other day.      [provider]    Family History    Family History  Problem Relation Age of Onset  . Heart disease Mother    She indicated that the status of her mother is unknown.   Social History    Social History   Socioeconomic History  . Marital status: Legally Separated    Spouse name: Not on file  . Number of children: Not on file  . Years of education: Not on file  . Highest education level: Not on file  Occupational History  . Not on file  Social Needs  . Financial resource strain: Not on file  . Food insecurity    Worry: Not on file    Inability: Not on  file  . Transportation needs    Medical: Not on file    Non-medical: Not on file  Tobacco Use  . Smoking status: Never Smoker  . Smokeless tobacco: Never Used  Substance and Sexual Activity  . Alcohol use: No    Alcohol/week: 0.0 standard drinks  . Drug use: No  . Sexual activity: Not on file  Lifestyle  . Physical activity    Days per week: Not on file    Minutes per session: Not on file  . Stress: Not on file  Relationships  . Social Herbalist on phone: Not on file    Gets together: Not on file    Attends religious service: Not on file    Active member of club  or organization: Not on file    Attends meetings of clubs or organizations: Not on file    Relationship status: Not on file  . Intimate partner violence    Fear of current or ex partner: Not on file    Emotionally abused: Not on file    Physically abused: Not on file    Forced sexual activity: Not on file  Other Topics Concern  . Not on file  Social History Narrative  . Not on file     Review of Systems    Comprehensive review of systems performed with pertinent positives and negatives noted in the HPI.  Physical Exam    BP 121/76   Pulse 86   Temp 98 F (36.7 C) (Oral)   Resp 16   Ht $R'5\' 3"'uO$  (1.6 m)   Wt 131.1 kg   SpO2 100%   BMI 51.20 kg/m  General: Alert, NAD, obese HEENT: Normal  Neck: No bruits or JVD. Lungs:  Resp regular and unlabored, CTA bilaterally. Heart: Regular rhythm, no s3, s4, or murmurs. Abdomen: Soft, non-tender, non-distended, BS +.  Extremities: Warm. No clubbing, cyanosis. Trace edema. R radial pulse 1+, L radial pulse 2+.  Psych: Normal affect. Neuro: Alert and oriented. No gross focal deficits. No abnormal movements.  Labs    Troponin (Point of Care Test) No results for input(s): TROPIPOC in the last 72 hours. No results for input(s): CKTOTAL, CKMB, TROPONINI in the last 72 hours. Lab Results  Component Value Date   WBC 4.9 06/03/2018   HGB 10.6 (L)  06/03/2018   HCT 32.9 (L) 06/03/2018   MCV 86 06/03/2018   PLT 216 06/03/2018   No results for input(s): NA, K, CL, CO2, BUN, CREATININE, CALCIUM, PROT, BILITOT, ALKPHOS, ALT, AST, GLUCOSE in the last 168 hours.  Invalid input(s): LABALBU No results found for: CHOL, HDL, LDLCALC, TRIG No results found for: [facility]   Radiology Studies    No results found.  ECG & Cardiac Imaging    ECG from [facility] shows NSR with RBBB and borderline 1st degree AVB.  - personally reviewed.  Telemetry here shows sinus tachycardia with occasional second degree AVB with 2:1 and 3:1 conduction - PR appears to be c/w Mobitz type II during periods of 3:1.   Assessment & Plan   Possible unstable angina with intermediate risk stress test:  Chest pain is a little atypical, though she does have some exertional symptoms and clearly abnormal stress test suggestive of at least moderate ischemic territory. She has not been tolerant of antianginal therapy, so in any case I agree that angiography at this point is certainly reasonable. Stress suggests Cx territory, and if dominant this could also be contributing to her bradycardia. Troponin at [facility] negative x 3. I think some minor lower GI bleeding a decade ago is not relevant at this point and should not have any issues with aspirin.  - NPO for LHC - Continue Plavix - Load with aspirin then daily  - Continue atorvastatin - Stop metoprolol due to bradycardia   Second degree Mobitz II AVB:  Noted on our telemetry while she was awake. Metoprolol had just been held and she remains on diltiazem. I still am not clear why she needs any AV nodal blockade and I think both can be permanently discontinued until she proves otherwise. Given inferolateral perfusion defect, cannot rule out contribution of ischemia from dominant Cx.  - Discontinue diltiazem and metoprolol - would not restart - Ischemic evaluation as above -  If she still has persistent symptomatic bradycardia then she may  need to be considered for PPM. - ECG and continue to monitor on telemetry  HFpEF: overall seems euvolemic. Symptoms confounded by problems above.  - Continue Lasix  CKD: progressively worsening renal function over last few years, currently stable. - Minimize contrast as able - Should be on ACEi and should tolerate with discontinuation of CCB and BB - Continue Lasix  OSA: reports intolerance to mask years ago - Consider referral to sleep medicine at discharge  Type 2 DM:  - Hold metformin and Actos while inpatient - Basal insulin + SSI for now - Check A1c, last in 2012  Hypothyroidism: recent TSH was 9.1. Suspect may be taking synthroid incorrectly. - Continue Synthroid 75 mcg, timed 6am  IDA: no prior ferritin that I can see. Hgb 10.6. May be contributing to symptoms. - Check ferritin. If low would given IV iron.   Nutrition: NPO for cath DVT ppx: Start Lovenox after cath GI ppx: home PPI Advanced Care Planning: Full Code   Signed, Marykay Lex, MD 06/17/2018, 7:05 AM

## 2018-06-17 NOTE — Progress Notes (Addendum)
The patient has been seen in conjunction with Reino Bellis, NP. All aspects of care have been considered and discussed. The patient has been personally interviewed, examined, and all clinical data has been reviewed.   If cath does not reveal significant disease, consider LOOP to identify causal arrhythmia.  COVID - 19 negative.  The patient was counseled to undergo left heart catheterization, coronary angiography, and possible percutaneous coronary intervention with stent implantation. The procedural risks and benefits were discussed in detail. The risks discussed included death, stroke, myocardial infarction, life-threatening bleeding, limb ischemia, kidney injury, allergy, and possible emergency cardiac surgery. The risk of these significant complications were estimated to occur less than 1% of the time. After discussion, the patient has agreed to proceed.   Progress Note  Patient Name: Vickie Ferguson Date of Encounter: 06/17/2018  Primary Cardiologist: No primary care provider on file.   Subjective   No chest pain this morning. Anxious about the hospital stay.   Inpatient Medications    Scheduled Meds: . [START ON 06/18/2018] aspirin  81 mg Oral Daily  . aspirin  325 mg Oral Once  . atorvastatin  40 mg Oral QHS  . clopidogrel  75 mg Oral Daily  . enoxaparin (LOVENOX) injection  40 mg Subcutaneous Q24H  . fluticasone  2 spray Each Nare Daily  . furosemide  20 mg Oral Q1500  . furosemide  40 mg Oral Daily  . levothyroxine  75 mcg Oral Q0600  . pantoprazole  40 mg Oral Daily   Continuous Infusions:  PRN Meds: acetaminophen, ALPRAZolam, nitroGLYCERIN, ondansetron, oxyCODONE, polyethylene glycol, polyvinyl alcohol   Vital Signs    Vitals:   06/17/18 0246 06/17/18 0535  BP: (!) 119/54 121/76  Pulse: 93 86  Resp: 16   Temp: 97.8 F (36.6 C) 98 F (36.7 C)  TempSrc: Oral Oral  SpO2: 100% 100%  Weight: 131.1 kg   Height: $Remove'5\' 3"'BHEkiPt$  (1.6 m)     Intake/Output Summary  (Last 24 hours) at 06/17/2018 0902 Last data filed at 06/17/2018 8563 Gross per 24 hour  Intake 0 ml  Output 400 ml  Net -400 ml   Last 3 Weights 06/17/2018 06/03/2018 05/30/2018  Weight (lbs) 289 lb 0.4 oz 287 lb 6.4 oz 286 lb  Weight (kg) 131.1 kg 130.364 kg 129.729 kg      Telemetry    SB with intermittent Mobitz type II - Personally Reviewed  ECG    N/a - Personally Reviewed  Physical Exam   GEN: No acute distress. Obese older WF Neck: Difficult to assess JVD 2/2 to body habitus.  Cardiac: RRR, + systolic murmur, rubs, or gallops.  Respiratory: Clear to auscultation bilaterally. GI: Soft, nontender, non-distended  MS: No edema; No deformity. Neuro:  Nonfocal  Psych: Normal affect   Labs    ChemistryNo results for input(s): NA, K, CL, CO2, GLUCOSE, BUN, CREATININE, CALCIUM, PROT, ALBUMIN, AST, ALT, ALKPHOS, BILITOT, GFRNONAA, GFRAA, ANIONGAP in the last 168 hours.   HematologyNo results for input(s): WBC, RBC, HGB, HCT, MCV, MCH, MCHC, RDW, PLT in the last 168 hours.  Cardiac EnzymesNo results for input(s): TROPONINI in the last 168 hours. No results for input(s): TROPIPOC in the last 168 hours.   BNPNo results for input(s): BNP, PROBNP in the last 168 hours.   DDimer No results for input(s): DDIMER in the last 168 hours.   Radiology    No results found.  Cardiac Studies   TTE: 06/09/18  IMPRESSIONS    1. The  left ventricle has normal systolic function, with an ejection fraction of 55-60%. The cavity size was normal. Left ventricular diastolic function could not be evaluated.  2. The right ventricle has normal systolic function. The cavity was normal. There is no increase in right ventricular wall thickness.  3. Mitral valve regurgitation is mild to moderate by color flow Doppler.  4. The tricuspid valve is grossly normal.  5. The aortic valve is grossly normal. Moderate sclerosis of the aortic valve. Aortic valve regurgitation is trivial by color flow Doppler.    Patient Profile     63 y.o. female with history of AVNRT ablation c/b RBBB (1996), OSA not on CPAP, type 2 DM, IDA, HFpEF, morbid obesity, CKD, and TIA on Plavix, transferred from [facility] for a positive stress test and bradycardia after initially presenting there for chest pain.  Assessment & Plan    1. Unstable angina with intermediate risk stress test:  Chest pain was a little atypical, though she does have some exertional symptoms and clearly abnormal stress test suggestive of at least moderate ischemic territory.Troponin at [facility] negative x 3. Will need to undergo cardiac cath to define anatomy. Labs pending this morning. Cr was around 1.7 while at [facility]. - The patient understands that risks included but are not limited to stroke (1 in 1000), death (1 in 1000), kidney failure [usually temporary] (1 in 500), bleeding (1 in 200), allergic reaction [possibly serious] (1 in 200).  - on Plavix, ASA, and statin - Rapid COVID ordered - will start IVFs at 75 hr for now as she has received KVO prior to admission, until Cr known.   2. Second degree Mobitz II AVB:  Noted on our telemetry while she was awake. Metoprolol had just been held prior to admission to [facility] and she remained on diltiazem. She reports NOT getting any cardiac meds yesterday. Last doses of Dilt per her report was Sunday night. Planned to undergo ischemic evaluation as above.  - Discontinue diltiazem and metoprolol - would not restart - Ischemic evaluation as above - If persistent symptomatic bradycardia then she may need to be considered for PPM. - follow on telemetry  3. HFpEF: overall seems euvolemic. Symptoms confounded by problems above.  - hold lasix for now  4. CKD: progressively worsening renal function over last few years, currently stable. - holding lasix at this time - BMET pending this morning. Cr around 1.7 while at [facility]  5. OSA: reports intolerance to mask years ago - Consider referral to sleep  medicine at discharge  6. Type 2 DM:  - Hold metformin and Actos while inpatient - Basal insulin + SSI for now - Check A1c, last in 2012  7. Hypothyroidism: recent TSH was 6.7. Normal free T4 and T3 at OSH. - Continue Synthroid 75 mcg, timed 6am  8. IDA: no prior ferritin that I can see. Hgb 10.6. May be contributing to symptoms. - check ferritin    For questions or updates, please contact [place] Please consult [email or web address] for contact info under        Signed, Reino Bellis, NP  06/17/2018, 9:02 AM

## 2018-06-17 NOTE — Progress Notes (Signed)
Received post Ca Cath, pt alert and oriented. Right radial  Level 0. Patient no complaints of any discomfort. Patient belonging, 2 wallets sealed in security bag  returned to the patient. Patient assumed responsibility.

## 2018-06-17 NOTE — Plan of Care (Signed)
  Problem: Education: Goal: Knowledge of General Education information will improve Description: Including pain rating scale, medication(s)/side effects and non-pharmacologic comfort measures 06/17/2018 0448 by Nelia Shi, RN Outcome: Progressing 06/17/2018 0448 by Nelia Shi, RN Outcome: Progressing

## 2018-06-18 ENCOUNTER — Encounter (HOSPITAL_COMMUNITY): Payer: Self-pay | Admitting: Cardiology

## 2018-06-18 ENCOUNTER — Other Ambulatory Visit: Payer: Self-pay | Admitting: Cardiology

## 2018-06-18 DIAGNOSIS — R002 Palpitations: Secondary | ICD-10-CM

## 2018-06-18 DIAGNOSIS — I10 Essential (primary) hypertension: Secondary | ICD-10-CM

## 2018-06-18 DIAGNOSIS — E119 Type 2 diabetes mellitus without complications: Secondary | ICD-10-CM

## 2018-06-18 DIAGNOSIS — G4733 Obstructive sleep apnea (adult) (pediatric): Secondary | ICD-10-CM

## 2018-06-18 LAB — GLUCOSE, CAPILLARY
Glucose-Capillary: 124 mg/dL — ABNORMAL HIGH (ref 70–99)
Glucose-Capillary: 150 mg/dL — ABNORMAL HIGH (ref 70–99)

## 2018-06-18 NOTE — Plan of Care (Signed)
  Problem: Education: Goal: Knowledge of General Education information will improve Description Including pain rating scale, medication(s)/side effects and non-pharmacologic comfort measures Outcome: Progressing   

## 2018-06-18 NOTE — Discharge Summary (Addendum)
The patient has been seen in conjunction with Vickie Bellis, NP. All aspects of care have been considered and discussed. The patient has been personally interviewed, examined, and all clinical data has been reviewed.   The patient has normal coronary arteries.  Initial presentation was concerning.  No marker evidence of myocardial injury.  No further cardiac evaluation at this point.  The history is notable for palpitations that accompany her chest pain.  We need to exclude atrial fibrillation or ventricular arrhythmias as the underlying problem.  A 30-day monitor will be planned.   Discharge Summary    Patient ID: Vickie Ferguson,  MRN: [id number], DOB/AGE: 05-06-1955 63 y.o.  Admit date: 06/17/2018 Discharge date: 06/18/2018  Primary Care Provider: Nicoletta Ferguson Primary Cardiologist: Dr. Agustin Ferguson  Discharge Diagnoses    Principal Problem:   Unstable angina [facility]) Active Problems:   Essential hypertension   Type 2 diabetes mellitus without complication (HCC)   Ventricular tachycardia (paroxysmal) (HCC)   OSA (obstructive sleep apnea)   Pressure injury of skin   Allergies Allergies  Allergen Reactions   Propafenone Anaphylaxis    bradycardia bradycardia bradycardia    Aleve [Naproxen Sodium] Swelling   Codeine Nausea And Vomiting   Doxycycline    Other    Procainamide Swelling   Rythmol [Propafenone Hcl] Other (See Comments)    bradycardia   Sertraline    Isosorbide Nitrate     Headaches    Diagnostic Studies/Procedures    Cath: 3/78/58   LV end diastolic pressure is normal.   1. Normal coronary anatomy 2. Normal LVEDP  Plan: medical management.   Diagnostic Dominance: Left   _____________   History of Present Illness     63 year old woman with history of AVNRT ablation c/b RBBB (1996), OSA not on CPAP, type 2 DM, IDA, HFpEF, morbid obesity, CKD, and TIA on Plavix, transferred from [facility] for a positive stress test  and bradycardia after initially presenting there for chest pain.  She reported 3-4 month history of intermittent chest discomfort occurring at rest, though per notes from Dr. Agustin Ferguson, this seemed to be more long-standing. She went to [facility] on 6/14 after experiencing a more severe and prolonged episode of chest heaviness followed by shortness of breath. She had been having less severe episodes with increasing frequency over the past few months, particularly while she was showering. She had to stop showering and rest as she is unable to stand up long enough due to fatigue and dyspnea. She has found that her BP and pulse when getting out of the shower are commonly both low (ie HR 40s, BP 90s/50s) and that her metoprolol had been decreased. She frequently felt that her heart was "out of rhythm" which she really could not describe, saying it was both fast and slow. While at [facility], she described the chest pain as a little worse laying back and associated with nausea and diaphoresis. Her symptoms improved with nitroglycerin in the emergency department and now have resolved, though she does still feel her heart is "out of rhythm" at times.   Initial labs were notable for negative serial troponins (undetectable), pro-BNP 753, Cr 1.7, hgb 10.9, plt 207. ECG showed NSR with RBBB and borderline 1st degree AVB. She was continued on Plavix. She was not treated with aspirin due to intolerance, though clarified this with her and she reports having some blood in her stool while on aspirin 10 years ago with no issues since then.  She was intermittently bradycardic to the 40s so metoprolol was held and she was continued on diltiazem. She underwent Lexiscan nuclear stress that was reported to show an inferolateral moderate to large reversible perfusion defect, as well as a small fixed anterior defect. She was seen by cardiology there and determined to need inpatient left heart cath, then transferred here for  further management. She also was given IV fluids due to her CKD in anticipation of this procedure. Fortunately, she currently denied any dyspnea or significant orthopnea.    Hospital Course     On admission to [facility] her diltiazem was stopped as she had episodes of continued bradycardia with concern for Mobitz type II. COVID neg. Underwent cardiac cath with normal coronaries and normal LVEDP. Recent echo from 06/09/18 with normal EF. After stopping her AVB agents her HR improved into the 70s and blood pressure was stable without the addition of medications. No complications noted post cath. Right radial cath site stable. Was able to ambulate with nursing staff. Will plan for outpatient cardiac monitor to further assess for arrhythmias.   General: Well developed, well nourished, obese older W female appearing in no acute distress. Head: Normocephalic, atraumatic.  Neck: Supple without bruits, JVD. Lungs:  Resp regular and unlabored, CTA. Heart: RRR, S1, S2, + systolic murmur; no rub. Abdomen: Soft, non-tender, non-distended with normoactive bowel sounds. No hepatomegaly. No rebound/guarding. No obvious abdominal masses. Extremities: No clubbing, cyanosis, edema. Distal pedal pulses are 2+ bilaterally. Right radial cath site stable without bruising or hematoma Neuro: Alert and oriented X 3. Moves all extremities spontaneously. Psych: Normal affect.  NIMO VERASTEGUI was seen by Dr. Tamala Ferguson and determined stable for discharge home. Follow up in the office has been arranged. Medications are listed below.   _____________  Discharge Vitals Blood pressure (!) 119/59, pulse 85, temperature 97.6 F (36.4 C), temperature source Oral, resp. rate 18, height $RemoveBe'5\' 3"'BbCNjHMPs$  (1.6 m), weight 129.4 kg, SpO2 98 %.  Filed Weights   06/17/18 0246 06/18/18 0220  Weight: 131.1 kg 129.4 kg    Labs & Radiologic Studies    CBC Recent Labs    06/17/18 0914  WBC 5.2  HGB 10.3*  HCT 32.6*  MCV 89.1  PLT 403   Basic  Metabolic Panel Recent Labs    06/17/18 0914  NA 140  K 4.3  CL 107  CO2 25  GLUCOSE 134*  BUN 19  CREATININE 1.58*  CALCIUM 8.9  MG 2.1   Liver Function Tests No results for input(s): AST, ALT, ALKPHOS, BILITOT, PROT, ALBUMIN in the last 72 hours. No results for input(s): LIPASE, AMYLASE in the last 72 hours. Cardiac Enzymes No results for input(s): CKTOTAL, CKMB, CKMBINDEX, TROPONINI in the last 72 hours. BNP Invalid input(s): POCBNP D-Dimer No results for input(s): DDIMER in the last 72 hours. Hemoglobin A1C Recent Labs    06/17/18 0914  HGBA1C 6.0*   Fasting Lipid Panel No results for input(s): CHOL, HDL, LDLCALC, TRIG, CHOLHDL, LDLDIRECT in the last 72 hours. Thyroid Function Tests No results for input(s): TSH, T4TOTAL, T3FREE, THYROIDAB in the last 72 hours.  Invalid input(s): FREET3 _____________  No results found. Disposition   Pt is being discharged home today in good condition.  Follow-up Plans & Appointments    Follow-up Information    Park Liter, MD Follow up on 07/03/2018.   Specialty: Cardiology Why: at 10:45am for your follow up appt.  Contact information: [address] [place] [address] [phone]  Discharge Medications     Medication List    STOP taking these medications   diltiazem 120 MG 24 hr capsule Commonly known as: CARDIZEM CD   metoprolol tartrate 25 MG tablet Commonly known as: LOPRESSOR     TAKE these medications   ALPRAZolam 1 MG tablet Commonly known as: XANAX Take 1 mg by mouth every 8 (eight) hours as needed. For anxiety   atorvastatin 40 MG tablet Commonly known as: LIPITOR Take 40 mg by mouth at bedtime.   clopidogrel 75 MG tablet Commonly known as: PLAVIX Take 1 tablet by mouth daily.   Dexilant 60 MG capsule Generic drug: dexlansoprazole Take 1 tablet by mouth daily.   fluticasone 50 MCG/ACT nasal spray Commonly known as: FLONASE Place 2 sprays into the nose daily as  needed. For allergies   furosemide 40 MG tablet Commonly known as: LASIX Take 40 mg in the morning and 20 mg in the evening   hydroxypropyl methylcellulose / hypromellose 2.5 % ophthalmic solution Commonly known as: ISOPTO TEARS / GONIOVISC Place 2 drops into both eyes 3 (three) times daily as needed. For dry eyes   levothyroxine 75 MCG tablet Commonly known as: SYNTHROID Take 1 tablet by mouth daily.   metFORMIN 500 MG tablet Commonly known as: GLUCOPHAGE Take 500 mg by mouth 2 (two) times daily with a meal. Notes to patient: Please do not restart until 6/18!!!!   nitroGLYCERIN 0.4 MG SL tablet Commonly known as: NITROSTAT Place 1 tablet (0.4 mg total) under the tongue every 5 (five) minutes as needed.   ondansetron 4 MG tablet Commonly known as: ZOFRAN Take 4 mg by mouth 3 (three) times daily as needed. For nausea   Oxycodone HCl 10 MG Tabs Take 10 mg by mouth every 6 (six) hours as needed (pain).   pioglitazone 45 MG tablet Commonly known as: ACTOS Take 45 mg by mouth daily.   polyethylene glycol 17 g packet Commonly known as: MIRALAX / GLYCOLAX Take 17 g by mouth every other day.       Acute coronary syndrome (MI, NSTEMI, STEMI, etc) this admission?: No.   Outstanding Labs/Studies   Outpatient cardiac monitor.   Duration of Discharge Encounter   Greater than 30 minutes including physician time.  Signed, Vickie Bellis NP-C 06/18/2018, 12:03 PM

## 2018-06-18 NOTE — Plan of Care (Signed)
  Problem: Pain Managment: Goal: General experience of comfort will improve Outcome: Adequate for Discharge   Problem: Nutrition: Goal: Adequate nutrition will be maintained Outcome: Adequate for Discharge   Problem: Education: Goal: Knowledge of General Education information will improve Description: Including pain rating scale, medication(s)/side effects and non-pharmacologic comfort measures Outcome: Adequate for Discharge

## 2018-06-20 ENCOUNTER — Telehealth: Payer: Self-pay | Admitting: *Deleted

## 2018-06-20 NOTE — Telephone Encounter (Signed)
[employer] to ship a 30 day cardiac event monitor to her home.  Instructions reviewed briefly as they are included in the monitor kit.

## 2018-06-23 ENCOUNTER — Telehealth: Payer: Self-pay | Admitting: Cardiology

## 2018-06-23 NOTE — Telephone Encounter (Signed)
°*  STAT* If patient is at the pharmacy, call can be transferred to refill team.   1. Which medications need to be refilled? (please list name of each medication and dose if known) metoprolol tartrate (LOPRESSOR) 25 MG tablet [[id number]]   2. Which pharmacy/location (including street and city if local pharmacy) is medication to be sent to?  [facility] [place], [place] AT [place] [phone] (Phone) [phone] (Fax)     3. Do they need a 30 day or 90 day supply? 90 day

## 2018-06-24 ENCOUNTER — Telehealth: Payer: Self-pay | Admitting: Cardiology

## 2018-06-24 DIAGNOSIS — R42 Dizziness and giddiness: Secondary | ICD-10-CM

## 2018-06-24 NOTE — Telephone Encounter (Signed)
Patient was sent home from [facility] without any meds and she has not gotten her monitor either, Please call her!!

## 2018-06-25 NOTE — Telephone Encounter (Signed)
Called patient she is very upset and concerned. She was recently in [facility] and had a heart cath done. They had issues with her heart rate dropping to the 30s so she has been taken off metoprolol and cardizem since then. She reports her heart rate has been "all over the place" from 54-104, and her blood pressure today was 150/64. She is dizzy when she gets up and has shortness of breath. She has an appointment next week July 2nd with Dr. Agustin Cree. I will consult with him and follow up with patient.

## 2018-06-25 NOTE — Telephone Encounter (Signed)
Can we get monitor on her for 48h asap

## 2018-06-25 NOTE — Telephone Encounter (Signed)
Called patient informed her to come to [facility] office tomorrow for monitor. Patient verbally understands.

## 2018-06-25 NOTE — Addendum Note (Signed)
Addended by: Ashok Norris on: 06/25/2018 03:36 PM   Modules accepted: Orders

## 2018-06-26 ENCOUNTER — Other Ambulatory Visit: Payer: Self-pay

## 2018-06-26 ENCOUNTER — Ambulatory Visit (INDEPENDENT_AMBULATORY_CARE_PROVIDER_SITE_OTHER): Payer: Medicare Other

## 2018-06-26 DIAGNOSIS — I10 Essential (primary) hypertension: Secondary | ICD-10-CM | POA: Diagnosis not present

## 2018-06-26 DIAGNOSIS — R42 Dizziness and giddiness: Secondary | ICD-10-CM

## 2018-06-26 DIAGNOSIS — Z79899 Other long term (current) drug therapy: Secondary | ICD-10-CM | POA: Diagnosis not present

## 2018-06-26 DIAGNOSIS — E039 Hypothyroidism, unspecified: Secondary | ICD-10-CM | POA: Diagnosis not present

## 2018-06-26 DIAGNOSIS — R079 Chest pain, unspecified: Secondary | ICD-10-CM | POA: Diagnosis not present

## 2018-06-26 DIAGNOSIS — R002 Palpitations: Secondary | ICD-10-CM | POA: Diagnosis not present

## 2018-07-03 ENCOUNTER — Other Ambulatory Visit: Payer: Self-pay

## 2018-07-03 ENCOUNTER — Encounter: Payer: Self-pay | Admitting: Cardiology

## 2018-07-03 ENCOUNTER — Ambulatory Visit (INDEPENDENT_AMBULATORY_CARE_PROVIDER_SITE_OTHER): Payer: Medicare Other | Admitting: Cardiology

## 2018-07-03 VITALS — BP 124/64 | HR 58 | Ht 63.0 in | Wt 283.8 lb

## 2018-07-03 DIAGNOSIS — I10 Essential (primary) hypertension: Secondary | ICD-10-CM

## 2018-07-03 DIAGNOSIS — I472 Ventricular tachycardia: Secondary | ICD-10-CM | POA: Diagnosis not present

## 2018-07-03 DIAGNOSIS — I4729 Other ventricular tachycardia: Secondary | ICD-10-CM

## 2018-07-03 DIAGNOSIS — R072 Precordial pain: Secondary | ICD-10-CM | POA: Diagnosis not present

## 2018-07-03 DIAGNOSIS — E119 Type 2 diabetes mellitus without complications: Secondary | ICD-10-CM | POA: Diagnosis not present

## 2018-07-03 NOTE — Progress Notes (Signed)
Cardiology Office Note:    Date:  07/03/2018   ID:  AYDAN PHOENIX, DOB July 10, 1955, MRN [id number]  PCP:  Nicoletta Dress, MD  Cardiologist:  Jenne Campus, MD    Referring MD: Nicoletta Dress, MD   Chief Complaint  Patient presents with  . Hospitalization Follow-up  I was in the hospital  History of Present Illness:    Vickie Ferguson is a 63 y.o. female complex past medical history which include essential hypertension, diabetes, obstructive sleep apnea, recently she ended up going to the hospital because of atypical chest pain.  Troponins were negative, stress test was done which showed ischemia involving inferior wall.  She was transferred to [facility], cardiac catheterization has been done which showed normal coronaries, normal left ventricular ejection fraction, normal LVEDP.  Another concern was that during hospitalization she had multiple episodes of bradycardia and some dropped beats.  Beta-blocker has been withdrawn.  She wear monitor.  I did not have results of the monitor yet.  Menses not feeling well she is weak tired and exhausted she got episodes of her blood pressure dropping and just feel that she had difficulty walking around moving around I suspect may be some role play here depression.  No past medical history on file.  Past Surgical History:  Procedure Laterality Date  . CARDIAC CATHETERIZATION    . LEFT HEART CATH AND CORONARY ANGIOGRAPHY N/A 06/17/2018   Procedure: LEFT HEART CATH AND CORONARY ANGIOGRAPHY;  Surgeon: Martinique, Peter M, MD;  Location: [place] [facility];  Service: Cardiovascular;  Laterality: N/A;    Current Medications: Current Meds  Medication Sig  . ALPRAZolam (XANAX) 1 MG tablet Take 1 mg by mouth every 8 (eight) hours as needed. For anxiety   . atorvastatin (LIPITOR) 40 MG tablet Take 40 mg by mouth at bedtime.    . clopidogrel (PLAVIX) 75 MG tablet Take 1 tablet by mouth daily.  Marland Kitchen dexlansoprazole (DEXILANT) 60 MG capsule Take 1  tablet by mouth daily.  . fluticasone (FLONASE) 50 MCG/ACT nasal spray Place 2 sprays into the nose daily as needed. For allergies   . furosemide (LASIX) 40 MG tablet Take 40 mg in the morning and 20 mg in the evening  . hydroxypropyl methylcellulose (ISOPTO TEARS) 2.5 % ophthalmic solution Place 2 drops into both eyes 3 (three) times daily as needed. For dry eyes   . levothyroxine (SYNTHROID, LEVOTHROID) 75 MCG tablet Take 1 tablet by mouth daily.  . metFORMIN (GLUCOPHAGE) 500 MG tablet Take 500 mg by mouth 2 (two) times daily with a meal.   . nitroGLYCERIN (NITROSTAT) 0.4 MG SL tablet Place 1 tablet (0.4 mg total) under the tongue every 5 (five) minutes as needed.  . ondansetron (ZOFRAN) 4 MG tablet Take 4 mg by mouth 3 (three) times daily as needed. For nausea   . Oxycodone HCl 10 MG TABS Take 10 mg by mouth every 6 (six) hours as needed (pain).   . pioglitazone (ACTOS) 45 MG tablet Take 45 mg by mouth daily.    . polyethylene glycol (MIRALAX / GLYCOLAX) packet Take 17 g by mouth every other day.       Allergies:   Propafenone, Aleve [naproxen sodium], Codeine, Doxycycline, Other, Procainamide, Rythmol [propafenone hcl], Sertraline, and Isosorbide nitrate   Social History   Socioeconomic History  . Marital status: Legally Separated    Spouse name: Not on file  . Number of children: Not on file  . Years of education: Not  on file  . Highest education level: Not on file  Occupational History  . Not on file  Social Needs  . Financial resource strain: Not on file  . Food insecurity    Worry: Not on file    Inability: Not on file  . Transportation needs    Medical: Not on file    Non-medical: Not on file  Tobacco Use  . Smoking status: Never Smoker  . Smokeless tobacco: Never Used  Substance and Sexual Activity  . Alcohol use: No    Alcohol/week: 0.0 standard drinks  . Drug use: No  . Sexual activity: Not on file  Lifestyle  . Physical activity    Days per week: Not on file     Minutes per session: Not on file  . Stress: Not on file  Relationships  . Social Herbalist on phone: Not on file    Gets together: Not on file    Attends religious service: Not on file    Active member of club or organization: Not on file    Attends meetings of clubs or organizations: Not on file    Relationship status: Not on file  Other Topics Concern  . Not on file  Social History Narrative  . Not on file     Family History: [name] family history includes Heart disease in her mother. ROS:   Please see the history of present illness.    All 14 point review of systems negative except as described per history of present illness  EKGs/Labs/Other Studies Reviewed:    Cardiac catheterization done on 17 June 2018:  [place]: Left  Intervention   Recent Labs: 06/03/2018: ALT 5; NT-Pro BNP 577; TSH 9.100 06/17/2018: BUN 19; Creatinine, Ser 1.58; Hemoglobin 10.3; Magnesium 2.1; Platelets 188; Potassium 4.3; Sodium 140  Recent Lipid Panel No results found for: CHOL, TRIG, HDL, CHOLHDL, VLDL, LDLCALC, LDLDIRECT  Physical Exam:    VS:  BP 124/64   Pulse (!) 58   Ht $R'5\' 3"'Cu$  (1.6 m)   Wt 283 lb 12.8 oz (128.7 kg)   SpO2 96%   BMI 50.27 kg/m     Wt Readings from Last 3 Encounters:  07/03/18 283 lb 12.8 oz (128.7 kg)  06/18/18 285 lb 4.8 oz (129.4 kg)  06/03/18 287 lb 6.4 oz (130.4 kg)     GEN:  Well nourished, well developed in no acute distress HEENT: Normal NECK: No JVD; No carotid bruits LYMPHATICS: No lymphadenopathy CARDIAC: RRR, no murmurs, no rubs, no gallops RESPIRATORY:  Clear to auscultation without rales, wheezing or rhonchi  ABDOMEN: Soft, non-tender, non-distended MUSCULOSKELETAL:  No edema; No deformity  SKIN: Warm and dry LOWER EXTREMITIES: no swelling NEUROLOGIC:  Alert and oriented x 3 PSYCHIATRIC:  Normal affect   ASSESSMENT:    1. Essential hypertension   2. Type 2 diabetes mellitus without complication, without long-term  current use of insulin ([place])   3. Precordial pain   4. Ventricular tachycardia (paroxysmal) (H[facility])    PLAN:    In order of problems listed above:  1. Essential hypertension blood pressure well controlled continue present management 2. Type 2 diabetes stable followed by internal medicine team 3. Precordial chest pain but recent cardiac catheterization negative 4. History of ventricular tachycardia no recent arrhythmias. 5. Bradycardia: She did wear monitor awaiting results of it.   It is a sad situation she is not feeling well and we try to figure out what the problem seems to be so  far we cannot identify the reason hopefully answer will be in the monitor that she wear.   Medication Adjustments/Labs and Tests Ordered: Current medicines are reviewed at length with the patient today.  Concerns regarding medicines are outlined above.  No orders of the defined types were placed in this encounter.  Medication changes: No orders of the defined types were placed in this encounter.   Signed, Park Liter, MD, [facility] 07/03/2018 [place]

## 2018-07-03 NOTE — Patient Instructions (Signed)
Medication Instructions:  Your physician recommends that you continue on your current medications as directed. Please refer to the Current Medication list given to you today.  If you need a refill on your cardiac medications before your next appointment, please call your pharmacy.   Lab work: None.  If you have labs (blood work) drawn today and your tests are completely normal, you will receive your results only by: Marland Kitchen MyChart Message (if you have MyChart) OR . A paper copy in the mail If you have any lab test that is abnormal or we need to change your treatment, we will call you to review the results.  Testing/Procedures: None.   Follow-Up: At [facility], you and your health needs are our priority.  As part of our continuing mission to provide you with exceptional heart care, we have created designated Provider Care Teams.  These Care Teams include your primary Cardiologist (physician) and Advanced Practice Providers (APPs -  Physician Assistants and Nurse Practitioners) who all work together to provide you with the care you need, when you need it. You will need a follow up appointment in 1 months.  Please call our office 2 months in advance to schedule this appointment.  You may see No primary care provider on file. or another member of our [employer] Provider Team in [place]: Shirlee More, MD . Jyl Heinz, MD  Any Other Special Instructions Will Be Listed Below (If Applicable).

## 2018-07-11 ENCOUNTER — Telehealth: Payer: Self-pay | Admitting: Cardiology

## 2018-07-11 NOTE — Telephone Encounter (Signed)
Please call with monitor results

## 2018-07-14 NOTE — Telephone Encounter (Signed)
Phoned patient, informed of monitor results and that Dr. Agustin Cree would like her to be seen by Dr. Curt Bears for EP consult. Informed that her information has been forwarded to their office and they will be reaching out to her to schedule an office visit. She verbalizes understanding, no further questions or concerns at this time.

## 2018-07-16 NOTE — Telephone Encounter (Signed)
Forwarding to EP scheduler to call and arrange

## 2018-07-16 NOTE — Telephone Encounter (Signed)
Pt calling back about appt. Please advise.

## 2018-08-04 ENCOUNTER — Telehealth: Payer: Self-pay

## 2018-08-04 ENCOUNTER — Other Ambulatory Visit: Payer: Self-pay | Admitting: Cardiology

## 2018-08-04 NOTE — Telephone Encounter (Signed)

## 2018-08-05 ENCOUNTER — Ambulatory Visit (INDEPENDENT_AMBULATORY_CARE_PROVIDER_SITE_OTHER): Payer: Medicare Other | Admitting: Cardiology

## 2018-08-05 ENCOUNTER — Other Ambulatory Visit: Payer: Self-pay

## 2018-08-05 ENCOUNTER — Encounter: Payer: Self-pay | Admitting: Cardiology

## 2018-08-05 VITALS — BP 132/74 | HR 66 | Ht 63.0 in | Wt 285.0 lb

## 2018-08-05 DIAGNOSIS — I441 Atrioventricular block, second degree: Secondary | ICD-10-CM | POA: Diagnosis not present

## 2018-08-05 DIAGNOSIS — Z01812 Encounter for preprocedural laboratory examination: Secondary | ICD-10-CM | POA: Diagnosis not present

## 2018-08-05 NOTE — Patient Instructions (Addendum)
Medication Instructions:  Your physician recommends that you continue on your current medications as directed. Please refer to the Current Medication list given to you today.  * If you need a refill on your cardiac medications before your next appointment, please call your pharmacy.   Labwork: Pre procedure labs today: BMET & CBC *We will only notify you of abnormal results, otherwise continue current treatment plan.  You are scheduled for COVID pre procedural screening on 8/11 @ 2:30 pm  Testing/Procedures: Your physician has recommended that you have a pacemaker inserted. A pacemaker is a small device that is placed under the skin of your chest or abdomen to help control abnormal heart rhythms. This device uses electrical pulses to prompt the heart to beat at a normal rate. Pacemakers are used to treat heart rhythms that are too slow. Wire (leads) are attached to the pacemaker that goes into the chambers of you heart. This is done in the hospital and usually requires and overnight stay. Please see the instruction below  Follow-Up: Your physician recommends that you schedule a wound check appointment 10-14 days, after your procedure on 08/15/18, with the device clinic.  Your physician recommends that you schedule a follow up appointment in 91 days, after your procedure on 08/15/18, with Dr. Curt Bears.  *Please note that any paperwork needing to be filled out by the provider will need to be addressed at the front desk prior to seeing the provider. Please note that any FMLA, disability or other documents regarding health condition is subject to a $25.00 charge that must be received prior to completion of paperwork in the form of a money order or check.  Thank you for choosing [facility]!!   Trinidad Curet, RN [phone]  Any Other Special Instructions Will Be Listed Below (If Applicable).   Implantable Device Instructions  You are scheduled for:                  _____ Pacemaker  implant  on  08/15/18  with Dr. Curt Bears.  1.   Please arrive at the [facility], Entrance "A"  at [facility] [place] at  8:30 a.m. on the day of your procedure. (The address is [address]  2. Do not eat or drink after midnight the night before your procedure.  3.   Complete pre procedure  lab work on 08/05/18.  You do not have to be fasting.  4.   Hold all of your morning medications the morning of your procedure.  5.  Plan for an overnight stay.  Bring your insurance cards and a list of you medications.  6.  Wash your chest and neck with surgical scrub the evening before and the morning of  your procedure.  Rinse well. Please review the surgical scrub instruction sheet given to you.                                                                                                                * If you have ANY  questions after you get home, please call Trinidad Curet, RN @ [phone].  * Every attempt is made to prevent procedures from being rescheduled.  Due to the nature of  Electrophysiology, rescheduling can happen.  The physician is always aware and directs the staff when this occurs.

## 2018-08-05 NOTE — Progress Notes (Signed)
Electrophysiology Office Note   Date:  08/05/2018   ID:  [name], [name] [birth year], MRN [id number]  PCP:  Nicoletta Dress, MD  Cardiologist: Agustin Cree Primary Electrophysiologist:  Alaijah Gibler Meredith Leeds, MD    No chief complaint on file.    History of Present Illness: Vickie Ferguson is a 63 y.o. female who is being seen today for the evaluation of heart block at the request of Park Liter, MD. Presenting today for electrophysiology evaluation.  She has a history significant for hypertension, diabetes, OSA.  She has had multiple episodes of bradycardia with some dropped beats.  Beta-blocker has been stopped.  Her main symptoms are fatigue and weakness.  She is also noted that her blood pressure drops at times.    Today, she denies symptoms of palpitations, chest pain, shortness of breath, orthopnea, PND, lower extremity edema, claudication, dizziness, syncope, bleeding, or neurologic sequela. The patient is tolerating medications without difficulties.  She does have episodes of weakness, fatigue, and near syncope.  She states that she feels that her heart rate is very slow and also very fast.  She wore a cardiac monitor that showed a second-degree AV block.   History reviewed. No pertinent past medical history. Past Surgical History:  Procedure Laterality Date  . CARDIAC CATHETERIZATION    . LEFT HEART CATH AND CORONARY ANGIOGRAPHY N/A 06/17/2018   Procedure: LEFT HEART CATH AND CORONARY ANGIOGRAPHY;  Surgeon: Martinique, Peter M, MD;  Location: [place] [facility];  Service: Cardiovascular;  Laterality: N/A;     Current Outpatient Medications  Medication Sig Dispense Refill  . ALPRAZolam (XANAX) 1 MG tablet Take 1 mg by mouth every 8 (eight) hours as needed. For anxiety     . atorvastatin (LIPITOR) 40 MG tablet Take 40 mg by mouth at bedtime.      . clopidogrel (PLAVIX) 75 MG tablet Take 1 tablet by mouth daily.    Marland Kitchen dexlansoprazole (DEXILANT) 60 MG capsule Take 1 tablet by  mouth daily.    . fluticasone (FLONASE) 50 MCG/ACT nasal spray Place 2 sprays into the nose daily as needed. For allergies     . furosemide (LASIX) 40 MG tablet TAKE 1 TABLET BY MOUTH IN THE MORNING AND 1/2 TABLET IN THE EVENING 30 tablet 6  . hydroxypropyl methylcellulose (ISOPTO TEARS) 2.5 % ophthalmic solution Place 2 drops into both eyes 3 (three) times daily as needed. For dry eyes     . levothyroxine (SYNTHROID, LEVOTHROID) 75 MCG tablet Take 1 tablet by mouth daily.    . metFORMIN (GLUCOPHAGE) 500 MG tablet Take 500 mg by mouth 2 (two) times daily with a meal.     . nitroGLYCERIN (NITROSTAT) 0.4 MG SL tablet Place 1 tablet (0.4 mg total) under the tongue every 5 (five) minutes as needed. 25 tablet 6  . ondansetron (ZOFRAN) 4 MG tablet Take 4 mg by mouth 3 (three) times daily as needed. For nausea     . Oxycodone HCl 10 MG TABS Take 10 mg by mouth every 6 (six) hours as needed (pain).     . pioglitazone (ACTOS) 45 MG tablet Take 45 mg by mouth daily.      . polyethylene glycol (MIRALAX / GLYCOLAX) packet Take 17 g by mouth every other day.       No current facility-administered medications for this visit.     Allergies:   Propafenone, Aleve [naproxen sodium], Codeine, Doxycycline, Other, Procainamide, Rythmol [propafenone hcl], Sertraline, and Isosorbide nitrate  Social History:  The patient  reports that she has never smoked. She has never used smokeless tobacco. She reports that she does not drink alcohol or use drugs.   Family History:  [name] family history includes Heart disease in her mother.    ROS:  Please see the history of present illness.   Otherwise, review of systems is positive for none.   All other systems are reviewed and negative.    PHYSICAL EXAM: VS:  BP 132/74   Pulse 66   Ht $R'5\' 3"'Ol$  (1.6 m)   Wt 285 lb (129.3 kg)   BMI 50.49 kg/m  , BMI Body mass index is 50.49 kg/m. GEN: Well nourished, well developed, in no acute distress  HEENT: normal  Neck: no  JVD, carotid bruits, or masses Cardiac: RRR; no murmurs, rubs, or gallops,no edema  Respiratory:  clear to auscultation bilaterally, normal work of breathing GI: soft, nontender, nondistended, + BS MS: no deformity or atrophy  Skin: warm and dry Neuro:  Strength and sensation are intact Psych: euthymic mood, full affect  EKG:  EKG is ordered today. Personal review of the ekg ordered shows sinus rhythm, intermittent junctional escape, right bundle branch block, rate 66  Recent Labs: 06/03/2018: ALT 5; NT-Pro BNP 577; TSH 9.100 06/17/2018: BUN 19; Creatinine, Ser 1.58; Hemoglobin 10.3; Magnesium 2.1; Platelets 188; Potassium 4.3; Sodium 140    Lipid Panel  No results found for: CHOL, TRIG, HDL, CHOLHDL, VLDL, LDLCALC, LDLDIRECT   Wt Readings from Last 3 Encounters:  08/05/18 285 lb (129.3 kg)  07/03/18 283 lb 12.8 oz (128.7 kg)  06/18/18 285 lb 4.8 oz (129.4 kg)      Other studies Reviewed: Additional studies/ records that were reviewed today include: TTE 06/10/18  Review of the above records today demonstrates:   1. The left ventricle has normal systolic function, with an ejection fraction of 55-60%. The cavity size was normal. Left ventricular diastolic function could not be evaluated.  2. The right ventricle has normal systolic function. The cavity was normal. There is no increase in right ventricular wall thickness.  3. Mitral valve regurgitation is mild to moderate by color flow Doppler.  4. The tricuspid valve is grossly normal.  5. The aortic valve is grossly normal. Moderate sclerosis of the aortic valve. Aortic valve regurgitation is trivial by color flow Doppler.  LHC 1/61/09  LV end diastolic pressure is normal.   1. Normal coronary anatomy 2. Normal LVEDP  Cardiac monitor 07/13/2018 personally reviewed Baseline rhythm: Sinus rhythm with intraventricular conduction delay Minimum heart rate: 38 BPM.  Average heart rate: 66 BPM.  Maximal heart rate 92 BPM. Conduction  abnormality: Multiple episodes of 2-1 AV conduction, as well as second-degree type II AV block.  Symptoms: Multiple symptomatology many times related to AV block.   ASSESSMENT AND PLAN:  1.  Second-degree AV block: At this point she has symptoms of weakness, and fatigue associated with second-degree AV block.  Would plan for pacemaker implant.  Risks and benefits were discussed include bleeding, tamponade, infection, pneumothorax.  The patient understands these risks and is agreed to the procedure.  2.  Hypertension: Currently well controlled.  No changes.    Current medicines are reviewed at length with the patient today.   The patient does not have concerns regarding her medicines.  The following changes were made today:  none  Labs/ tests ordered today include:  Orders Placed This Encounter  Procedures  . Basic metabolic panel  . CBC  .  EKG 12-Lead   Case discussed with referring  Disposition:   FU with Sabrie Moritz 3 months  Signed, Maalle Starrett Meredith Leeds, MD  08/05/2018 1:46 PM     [place] [address] [place] [address] [phone] (office) [phone] (fax)

## 2018-08-06 LAB — BASIC METABOLIC PANEL
BUN/Creatinine Ratio: 15 (ref 12–28)
BUN: 23 mg/dL (ref 8–27)
CO2: 23 mmol/L (ref 20–29)
Calcium: 9.1 mg/dL (ref 8.7–10.3)
Chloride: 101 mmol/L (ref 96–106)
Creatinine, Ser: 1.56 mg/dL — ABNORMAL HIGH (ref 0.57–1.00)
GFR calc Af Amer: 40 mL/min/{1.73_m2} — ABNORMAL LOW (ref >59)
GFR calc non Af Amer: 35 mL/min/{1.73_m2} — ABNORMAL LOW (ref >59)
Glucose: 121 mg/dL — ABNORMAL HIGH (ref 65–99)
Potassium: 4.7 mmol/L (ref 3.5–5.2)
Sodium: 139 mmol/L (ref 134–144)

## 2018-08-06 LAB — CBC
Hematocrit: 33.7 % — ABNORMAL LOW (ref 34.0–46.6)
Hemoglobin: 10.6 g/dL — ABNORMAL LOW (ref 11.1–15.9)
MCH: 27.6 pg (ref 26.6–33.0)
MCHC: 31.5 g/dL (ref 31.5–35.7)
MCV: 88 fL (ref 79–97)
Platelets: 241 10*3/uL (ref 150–450)
RBC: 3.84 x10E6/uL (ref 3.77–5.28)
RDW: 13.9 % (ref 11.7–15.4)
WBC: 7.1 10*3/uL (ref 3.4–10.8)

## 2018-08-12 ENCOUNTER — Other Ambulatory Visit (HOSPITAL_COMMUNITY)
Admission: RE | Admit: 2018-08-12 | Discharge: 2018-08-12 | Disposition: A | Discharge status: Home / Self Care | Payer: Medicare Other | Source: Ambulatory Visit | Attending: Cardiology | Admitting: Cardiology

## 2018-08-12 DIAGNOSIS — Z20828 Contact with and (suspected) exposure to other viral communicable diseases: Secondary | ICD-10-CM | POA: Insufficient documentation

## 2018-08-12 DIAGNOSIS — Z01812 Encounter for preprocedural laboratory examination: Secondary | ICD-10-CM | POA: Insufficient documentation

## 2018-08-12 LAB — SARS CORONAVIRUS 2 (TAT 6-24 HRS): SARS Coronavirus 2: NEGATIVE

## 2018-08-15 ENCOUNTER — Ambulatory Visit (HOSPITAL_COMMUNITY)
Admission: RE | Disposition: A | Discharge status: Home / Self Care | Payer: Medicare Other | Source: Home / Self Care | Attending: Cardiology

## 2018-08-15 ENCOUNTER — Encounter (HOSPITAL_COMMUNITY): Payer: Self-pay | Admitting: General Practice

## 2018-08-15 ENCOUNTER — Ambulatory Visit (HOSPITAL_COMMUNITY)
Admission: RE | Admit: 2018-08-15 | Discharge: 2018-08-16 | Disposition: A | Discharge status: Home / Self Care | Payer: Medicare Other | Attending: Cardiology | Admitting: Cardiology

## 2018-08-15 ENCOUNTER — Other Ambulatory Visit: Payer: Self-pay

## 2018-08-15 DIAGNOSIS — I441 Atrioventricular block, second degree: Secondary | ICD-10-CM | POA: Diagnosis present

## 2018-08-15 DIAGNOSIS — Z881 Allergy status to other antibiotic agents status: Secondary | ICD-10-CM | POA: Insufficient documentation

## 2018-08-15 DIAGNOSIS — Z7984 Long term (current) use of oral hypoglycemic drugs: Secondary | ICD-10-CM | POA: Insufficient documentation

## 2018-08-15 DIAGNOSIS — G4733 Obstructive sleep apnea (adult) (pediatric): Secondary | ICD-10-CM | POA: Insufficient documentation

## 2018-08-15 DIAGNOSIS — I700 Atherosclerosis of aorta: Secondary | ICD-10-CM | POA: Diagnosis not present

## 2018-08-15 DIAGNOSIS — Z886 Allergy status to analgesic agent status: Secondary | ICD-10-CM | POA: Insufficient documentation

## 2018-08-15 DIAGNOSIS — E119 Type 2 diabetes mellitus without complications: Secondary | ICD-10-CM | POA: Diagnosis not present

## 2018-08-15 DIAGNOSIS — Z950 Presence of cardiac pacemaker: Secondary | ICD-10-CM | POA: Diagnosis not present

## 2018-08-15 DIAGNOSIS — Z95818 Presence of other cardiac implants and grafts: Secondary | ICD-10-CM

## 2018-08-15 DIAGNOSIS — I10 Essential (primary) hypertension: Secondary | ICD-10-CM | POA: Insufficient documentation

## 2018-08-15 DIAGNOSIS — R001 Bradycardia, unspecified: Secondary | ICD-10-CM | POA: Diagnosis not present

## 2018-08-15 DIAGNOSIS — Z885 Allergy status to narcotic agent status: Secondary | ICD-10-CM | POA: Insufficient documentation

## 2018-08-15 HISTORY — DX: Other complications of anesthesia, initial encounter: T88.59XA

## 2018-08-15 HISTORY — DX: Chronic kidney disease, stage 3 unspecified: N18.30

## 2018-08-15 HISTORY — DX: Unspecified osteoarthritis, unspecified site: M19.90

## 2018-08-15 HISTORY — DX: Family history of other specified conditions: Z84.89

## 2018-08-15 HISTORY — DX: Other specified postprocedural states: Z98.890

## 2018-08-15 HISTORY — DX: Essential (primary) hypertension: I10

## 2018-08-15 HISTORY — DX: Hypothyroidism, unspecified: E03.9

## 2018-08-15 HISTORY — DX: Presence of cardiac pacemaker: Z95.0

## 2018-08-15 HISTORY — DX: Nausea with vomiting, unspecified: R11.2

## 2018-08-15 HISTORY — PX: INSERT / REPLACE / REMOVE PACEMAKER: SUR710

## 2018-08-15 HISTORY — DX: Anemia, unspecified: D64.9

## 2018-08-15 HISTORY — DX: Type 2 diabetes mellitus without complications: E11.9

## 2018-08-15 HISTORY — DX: Angina pectoris, unspecified: I20.9

## 2018-08-15 HISTORY — PX: PACEMAKER IMPLANT: EP1218

## 2018-08-15 HISTORY — DX: Atrioventricular block, second degree: I44.1

## 2018-08-15 LAB — GLUCOSE, CAPILLARY
Glucose-Capillary: 116 mg/dL — ABNORMAL HIGH (ref 70–99)
Glucose-Capillary: 103 mg/dL — ABNORMAL HIGH (ref 70–99)
Glucose-Capillary: 115 mg/dL — ABNORMAL HIGH (ref 70–99)
Glucose-Capillary: 130 mg/dL — ABNORMAL HIGH (ref 70–99)

## 2018-08-15 LAB — SURGICAL PCR SCREEN
MRSA, PCR: NEGATIVE
Staphylococcus aureus: NEGATIVE

## 2018-08-15 SURGERY — PACEMAKER IMPLANT

## 2018-08-15 MED ORDER — MUPIROCIN 2 % EX OINT
1.0000 "application " | TOPICAL_OINTMENT | Freq: Once | CUTANEOUS | Status: AC
  Administered 2018-08-15: 1 via TOPICAL
  Filled 2018-08-15: qty 22

## 2018-08-15 MED ORDER — METFORMIN HCL 500 MG PO TABS
500.0000 mg | ORAL_TABLET | Freq: Two times a day (BID) | ORAL | Status: DC
  Administered 2018-08-15 – 2018-08-16 (×2): 500 mg via ORAL
  Filled 2018-08-15 (×2): qty 1

## 2018-08-15 MED ORDER — CHLORHEXIDINE GLUCONATE 4 % EX LIQD
60.0000 mL | Freq: Once | CUTANEOUS | Status: DC
  Filled 2018-08-15: qty 60

## 2018-08-15 MED ORDER — MUPIROCIN 2 % EX OINT
TOPICAL_OINTMENT | CUTANEOUS | Status: AC
  Filled 2018-08-15: qty 22

## 2018-08-15 MED ORDER — SODIUM CHLORIDE 0.9 % IV SOLN
80.0000 mg | INTRAVENOUS | Status: AC
  Administered 2018-08-15: 80 mg

## 2018-08-15 MED ORDER — HEPARIN (PORCINE) IN NACL 1000-0.9 UT/500ML-% IV SOLN
INTRAVENOUS | Status: DC | PRN
  Administered 2018-08-15: 500 mL

## 2018-08-15 MED ORDER — SODIUM CHLORIDE 0.9 % IV SOLN
INTRAVENOUS | Status: DC
  Administered 2018-08-15: 09:00:00 via INTRAVENOUS

## 2018-08-15 MED ORDER — MIDAZOLAM HCL 5 MG/5ML IJ SOLN
INTRAMUSCULAR | Status: DC | PRN
  Administered 2018-08-15 (×2): 1 mg via INTRAVENOUS
  Administered 2018-08-15: 0.5 mg via INTRAVENOUS
  Administered 2018-08-15: 1 mg via INTRAVENOUS
  Administered 2018-08-15: 0.5 mg via INTRAVENOUS
  Administered 2018-08-15: 1 mg via INTRAVENOUS
  Administered 2018-08-15 (×2): 0.5 mg via INTRAVENOUS
  Administered 2018-08-15: 1 mg via INTRAVENOUS

## 2018-08-15 MED ORDER — ONDANSETRON HCL 4 MG PO TABS: 4.0000 mg | ORAL_TABLET | Freq: Three times a day (TID) | ORAL | Status: DC | PRN

## 2018-08-15 MED ORDER — FUROSEMIDE 20 MG PO TABS
20.0000 mg | ORAL_TABLET | Freq: Every evening | ORAL | Status: DC
  Administered 2018-08-15: 18:00:00 20 mg via ORAL
  Filled 2018-08-15: qty 1

## 2018-08-15 MED ORDER — ACETAMINOPHEN 500 MG PO TABS
500.0000 mg | ORAL_TABLET | Freq: Four times a day (QID) | ORAL | Status: DC | PRN
  Administered 2018-08-15 – 2018-08-16 (×4): 500 mg via ORAL
  Filled 2018-08-15 (×3): qty 1

## 2018-08-15 MED ORDER — FLUTICASONE PROPIONATE 50 MCG/ACT NA SUSP
1.0000 | Freq: Every day | NASAL | Status: DC | PRN
  Filled 2018-08-15: qty 16

## 2018-08-15 MED ORDER — ONDANSETRON HCL 4 MG/2ML IJ SOLN
4.0000 mg | Freq: Four times a day (QID) | INTRAMUSCULAR | Status: DC | PRN
  Administered 2018-08-16: 4 mg via INTRAVENOUS
  Filled 2018-08-15: qty 2

## 2018-08-15 MED ORDER — YOU HAVE A PACEMAKER BOOK
Freq: Once | Status: AC
  Administered 2018-08-16: 04:00:00
  Filled 2018-08-15: qty 1

## 2018-08-15 MED ORDER — ACETAMINOPHEN 500 MG PO TABS
ORAL_TABLET | ORAL | Status: AC
  Filled 2018-08-15: qty 1

## 2018-08-15 MED ORDER — CLOPIDOGREL BISULFATE 75 MG PO TABS
75.0000 mg | ORAL_TABLET | Freq: Every day | ORAL | Status: DC
  Administered 2018-08-15 – 2018-08-16 (×2): 75 mg via ORAL
  Filled 2018-08-15 (×2): qty 1

## 2018-08-15 MED ORDER — FENTANYL CITRATE (PF) 100 MCG/2ML IJ SOLN
INTRAMUSCULAR | Status: AC
  Filled 2018-08-15: qty 2

## 2018-08-15 MED ORDER — FUROSEMIDE 20 MG PO TABS
40.0000 mg | ORAL_TABLET | Freq: Every day | ORAL | Status: DC
  Administered 2018-08-16: 09:00:00 40 mg via ORAL
  Filled 2018-08-15: qty 2

## 2018-08-15 MED ORDER — IOHEXOL 350 MG/ML SOLN
INTRAVENOUS | Status: DC | PRN
  Administered 2018-08-15: 15 mL via INTRAVENOUS

## 2018-08-15 MED ORDER — MIDAZOLAM HCL 5 MG/5ML IJ SOLN
INTRAMUSCULAR | Status: AC
  Filled 2018-08-15: qty 5

## 2018-08-15 MED ORDER — SODIUM CHLORIDE 0.9 % IV SOLN
INTRAVENOUS | Status: AC
  Filled 2018-08-15: qty 2

## 2018-08-15 MED ORDER — HYPROMELLOSE (GONIOSCOPIC) 2.5 % OP SOLN
2.0000 [drp] | OPHTHALMIC | Status: DC | PRN
  Filled 2018-08-15: qty 15

## 2018-08-15 MED ORDER — LIDOCAINE HCL 1 % IJ SOLN
INTRAMUSCULAR | Status: AC
  Filled 2018-08-15: qty 60

## 2018-08-15 MED ORDER — LEVOTHYROXINE SODIUM 75 MCG PO TABS
75.0000 ug | ORAL_TABLET | Freq: Every day | ORAL | Status: DC
  Administered 2018-08-15 – 2018-08-16 (×2): 75 ug via ORAL
  Filled 2018-08-15 (×2): qty 1

## 2018-08-15 MED ORDER — DEXTROSE 5 % IV SOLN
3.0000 g | INTRAVENOUS | Status: AC
  Administered 2018-08-15: 11:00:00 3 g via INTRAVENOUS
  Filled 2018-08-15: qty 3000

## 2018-08-15 MED ORDER — OXYCODONE HCL 5 MG PO TABS
5.0000 mg | ORAL_TABLET | Freq: Four times a day (QID) | ORAL | Status: DC | PRN
  Administered 2018-08-15: 10 mg via ORAL
  Filled 2018-08-15: qty 2

## 2018-08-15 MED ORDER — ALPRAZOLAM 0.5 MG PO TABS: 1.0000 mg | ORAL_TABLET | Freq: Three times a day (TID) | ORAL | Status: DC | PRN

## 2018-08-15 MED ORDER — ATORVASTATIN CALCIUM 40 MG PO TABS
40.0000 mg | ORAL_TABLET | Freq: Every day | ORAL | Status: DC
  Administered 2018-08-15: 40 mg via ORAL
  Filled 2018-08-15: qty 1

## 2018-08-15 MED ORDER — PIOGLITAZONE HCL 45 MG PO TABS
45.0000 mg | ORAL_TABLET | Freq: Every day | ORAL | Status: DC
  Administered 2018-08-15 – 2018-08-16 (×2): 45 mg via ORAL
  Filled 2018-08-15 (×2): qty 1

## 2018-08-15 MED ORDER — LIDOCAINE HCL (PF) 1 % IJ SOLN
INTRAMUSCULAR | Status: DC | PRN
  Administered 2018-08-15: 75 mL

## 2018-08-15 MED ORDER — CEFAZOLIN SODIUM-DEXTROSE 1-4 GM/50ML-% IV SOLN
1.0000 g | Freq: Four times a day (QID) | INTRAVENOUS | Status: AC
  Administered 2018-08-15 – 2018-08-16 (×3): 1 g via INTRAVENOUS
  Filled 2018-08-15 (×3): qty 50

## 2018-08-15 MED ORDER — PANTOPRAZOLE SODIUM 40 MG PO TBEC
40.0000 mg | DELAYED_RELEASE_TABLET | Freq: Every day | ORAL | Status: DC
  Administered 2018-08-15 – 2018-08-16 (×2): 40 mg via ORAL
  Filled 2018-08-15 (×2): qty 1

## 2018-08-15 MED ORDER — FENTANYL CITRATE (PF) 100 MCG/2ML IJ SOLN
INTRAMUSCULAR | Status: DC | PRN
  Administered 2018-08-15 (×2): 25 ug via INTRAVENOUS
  Administered 2018-08-15 (×3): 12.5 ug via INTRAVENOUS
  Administered 2018-08-15: 25 ug via INTRAVENOUS
  Administered 2018-08-15: 12.5 ug via INTRAVENOUS
  Administered 2018-08-15: 25 ug via INTRAVENOUS

## 2018-08-15 MED ORDER — HEPARIN (PORCINE) IN NACL 1000-0.9 UT/500ML-% IV SOLN
INTRAVENOUS | Status: AC
  Filled 2018-08-15: qty 500

## 2018-08-15 SURGICAL SUPPLY — 8 items
CABLE SURGICAL S-101-97-12 (CABLE) ×3 IMPLANT
KIT MICROPUNCTURE NIT STIFF (SHEATH) ×2 IMPLANT
LEAD TENDRIL MRI 46CM LPA1200M (Lead) ×2 IMPLANT
LEAD TENDRIL MRI 52CM LPA1200M (Lead) ×2 IMPLANT
PACEMAKER ASSURITY DR-RF (Pacemaker) ×2 IMPLANT
PAD PRO RADIOLUCENT 2001M-C (PAD) ×3 IMPLANT
SHEATH 8FR PRELUDE SNAP 13 (SHEATH) ×4 IMPLANT
TRAY PACEMAKER INSERTION (PACKS) ×3 IMPLANT

## 2018-08-15 NOTE — Plan of Care (Signed)
  Problem: Education: Goal: Knowledge of cardiac device and self-care will improve Outcome: Progressing Goal: Ability to safely manage health related needs after discharge will improve Outcome: Progressing Goal: Individualized Educational Video(s) Outcome: Progressing   Problem: Cardiac: Goal: Ability to achieve and maintain adequate cardiopulmonary perfusion will improve Outcome: Progressing   Problem: Education: Goal: Knowledge of General Education information will improve Description: Including pain rating scale, medication(s)/side effects and non-pharmacologic comfort measures Outcome: Progressing   Problem: Health Behavior/Discharge Planning: Goal: Ability to manage health-related needs will improve Outcome: Progressing   Problem: Clinical Measurements: Goal: Will remain free from infection Outcome: Progressing

## 2018-08-15 NOTE — Discharge Instructions (Signed)
° ° °  Supplemental Discharge Instructions for  Pacemaker/Defibrillator Patients  Activity No heavy lifting or vigorous activity with your left/right arm for 6 to 8 weeks.  Do not raise your left/right arm above your head for one week.  Gradually raise your affected arm as drawn below.             08/18/2018                 08/19/2018                08/20/2018               08/21/2018 __  NO DRIVING until cleared to at  Your wound check visit.  WOUND CARE - Keep the wound area clean and dry.  Do not get this area wet , no showers until cleared to at your wound check visit  . - The tape/steri-strips on your wound will fall off; do not pull them off.  No bandage is needed on the site.  DO  NOT apply any creams, oils, or ointments to the wound area. - If you notice any drainage or discharge from the wound, any swelling or bruising at the site, or you develop a fever > 101? F after you are discharged home, call the office at once.  Special Instructions - You are still able to use cellular telephones; use the ear opposite the side where you have your pacemaker/defibrillator.  Avoid carrying your cellular phone near your device. - When traveling through airports, show security personnel your identification card to avoid being screened in the metal detectors.  Ask the security personnel to use the hand wand. - Avoid arc welding equipment, MRI testing (magnetic resonance imaging), TENS units (transcutaneous nerve stimulators).  Call the office for questions about other devices. - Avoid electrical appliances that are in poor condition or are not properly grounded. - Microwave ovens are safe to be near or to operate.

## 2018-08-15 NOTE — H&P (Signed)
Vickie Ferguson has presented today for surgery, with the diagnosis of second degree AV block.  The various methods of treatment have been discussed with the patient and family. After consideration of risks, benefits and other options for treatment, the patient has consented to  Procedure(s): Pacemaker implant as a surgical intervention .  Risks include but not limited to bleeding, tamponade, infection, pneumothorax, among others. [name] history has been reviewed, patient examined, no change in status, stable for surgery.  I have reviewed the patient's chart and labs.  Questions were answered to the patient's satisfaction.    Rubby Barbary Curt Bears, MD 08/15/2018 10:13 AM

## 2018-08-16 ENCOUNTER — Encounter (HOSPITAL_COMMUNITY): Payer: Self-pay | Admitting: Cardiology

## 2018-08-16 ENCOUNTER — Ambulatory Visit (HOSPITAL_COMMUNITY): Payer: Medicare Other

## 2018-08-16 DIAGNOSIS — I700 Atherosclerosis of aorta: Secondary | ICD-10-CM | POA: Diagnosis not present

## 2018-08-16 DIAGNOSIS — R001 Bradycardia, unspecified: Secondary | ICD-10-CM | POA: Diagnosis not present

## 2018-08-16 DIAGNOSIS — Z886 Allergy status to analgesic agent status: Secondary | ICD-10-CM | POA: Diagnosis not present

## 2018-08-16 DIAGNOSIS — I441 Atrioventricular block, second degree: Secondary | ICD-10-CM

## 2018-08-16 DIAGNOSIS — G4733 Obstructive sleep apnea (adult) (pediatric): Secondary | ICD-10-CM | POA: Diagnosis not present

## 2018-08-16 DIAGNOSIS — Z7984 Long term (current) use of oral hypoglycemic drugs: Secondary | ICD-10-CM | POA: Diagnosis not present

## 2018-08-16 DIAGNOSIS — I10 Essential (primary) hypertension: Secondary | ICD-10-CM | POA: Diagnosis not present

## 2018-08-16 DIAGNOSIS — Z950 Presence of cardiac pacemaker: Secondary | ICD-10-CM | POA: Diagnosis not present

## 2018-08-16 DIAGNOSIS — Z881 Allergy status to other antibiotic agents status: Secondary | ICD-10-CM | POA: Diagnosis not present

## 2018-08-16 DIAGNOSIS — E119 Type 2 diabetes mellitus without complications: Secondary | ICD-10-CM | POA: Diagnosis not present

## 2018-08-16 DIAGNOSIS — Z885 Allergy status to narcotic agent status: Secondary | ICD-10-CM | POA: Diagnosis not present

## 2018-08-16 NOTE — Discharge Summary (Addendum)
Discharge Summary    Patient ID: Vickie Ferguson MRN: [id number]; DOB: 04-Jan-1955  Admit date: 08/15/2018 Discharge date: 08/16/2018  Primary Care Provider: Nicoletta Dress, MD  Primary Cardiologist: Jenne Campus, MD  Primary Electrophysiologist:  Will Meredith Leeds, MD   Discharge Diagnoses    Active Problems:   Second degree AV block   Allergies Allergies  Allergen Reactions  . Aleve [Naproxen Sodium] Anaphylaxis and Swelling    Facial and hand swelling  . Procainamide Anaphylaxis and Swelling    Facial swelling  . Rythmol [Propafenone Hcl] Other (See Comments)    Bradycardia  . Codeine Nausea And Vomiting    Shakiness  . Doxycycline Other (See Comments)    Heart rythym issues  . Isosorbide Nitrate Other (See Comments)    Headaches  . Sertraline Hives and Rash    Large blisters. "Looked like I'd been burned".    Diagnostic Studies/Procedures    08/15/2018  PROCEDURES:   1. Pacemaker implantation.  2. Left upper extremity venography.     INTRODUCTION:  Vickie Ferguson is a 63 y.o. female with a history of bradycardia who presents today for pacemaker implantation.  The patient reports intermittent episodes of dizziness over the past few months.  No reversible causes have been identified.  The patient therefore presents today for pacemaker implantation.     DESCRIPTION OF PROCEDURE:  Informed written consent was obtained, and  the patient was brought to the electrophysiology lab in a fasting state.  The patient required no sedation for the procedure today.  The patients left chest was prepped and draped in the usual sterile fashion by the EP lab staff. The skin overlying the left deltopectoral region was infiltrated with lidocaine for local analgesia.  A 4-cm incision was made over the left deltopectoral region.  A left subcutaneous pacemaker pocket was fashioned using a combination of sharp and blunt dissection. Electrocautery was required to assure hemostasis.     Left Upper Extremity Venography: A venogram of the left upper extremity was performed, which revealed a large left cephalic vein, which emptied into a large left subclavian vein.  The left axillary vein was moderate in size.    RA/RV Lead Placement: The left axillary vein was therefore cannulated.  Through the left axillary vein, a St Jude Medical model Tendril MRI [id number] (serial number  [id number]) right atrial lead and a St Jude Medical model Tendril MRI [id number] (serial number  [id number]) right ventricular lead were advanced with fluoroscopic visualization into the right atrial appendage and right ventricular apex positions respectively.  Initial atrial lead P- waves measured 2.4 mV with impedance of 377 ohms and a threshold of 0.9 V at 0.5 msec.  Right ventricular lead R-waves measured 6.6 mV with an impedance of 776 ohms and a threshold of 0.5 V at 0.5 msec.  Both leads were secured to the pectoralis fascia using #2-0 silk over the suture sleeves.   Device Placement:  The leads were then connected to a Alexander MRI  model [id number] (serial number  [id number] ) pacemaker.  The pocket was irrigated with copious gentamicin solution.  The pacemaker was then placed into the pocket.  The pocket was then closed in 3 layers with 2.0 Vicryl suture for the 3.0 Vicryl suture subcutaneous and subcuticular layers.  Steri-  Strips and a sterile dressing were then applied. EBL<17ml.  There were no early apparent complications.     CONCLUSIONS:   1. Successful implantation of a Comptroller  Assurity MRI dual-chamber pacemaker for symptomatic bradycardia  2. No early apparent complications.      _____________   History of Present Illness     Vickie Ferguson is a 63 year old female with medical history significant for hypertension, diabetes, OSA.  She has had multiple episodes of bradycardia with some dropped beats.  Beta-blocker was discontinued.  She continued to have symptoms of fatigue and  weakness.  She was also noted to have some drops in blood pressure at times.  Dr. Bing Matter referred the patient to Dr. Elberta Fortis for EP evaluation and the patient was seen on 08/05/2018 she was noted to have episodes of weakness, fatigue and near syncope.  Dr. Elberta Fortis noted that the patient was feeling that her heart rate is very slow and also very fast.  She wore a cardiac monitor that showed a second-degree AV block.  No reversible causes were identified.  Dr. Elberta Fortis recommended pacemaker implant.  Hospital Course     Consultants: none  The patient underwent successful implantation of a St. Jude Medical surety MRI dual chamber pacemaker yesterday for symptomatic bradycardia.  There were no complications with the procedure.  The patient was monitored overnight.  She has been maintaining sinus rhythm with first-degree AV block in the 70s-90s with a brief episode of sinus tachycardia in the 150s during the night, with only occasional atrial pacing and rare ventricular pacing.  The patient has been up and down to the bedside commode this morning and feels well except for some soreness at her pacemaker site. She will be seen in the office in 2 weeks for incision check and 33-month pacemaker follow-up.  Patient has been seen by Dr. Ladona Ridgel today and deemed ready for discharge home. All follow up appointments have been scheduled. Discharge medications are listed below.  _____________  Discharge Vitals Blood pressure 135/67, pulse 73, temperature 97.7 F (36.5 C), temperature source Oral, resp. rate 10, height 5\' 4"  (1.626 m), weight 128.4 kg, SpO2 100 %.  Filed Weights   08/15/18 0834 08/15/18 1357 08/16/18 0400  Weight: 127.6 kg 127.5 kg 128.4 kg    Labs & Radiologic Studies    CBC No results for input(s): WBC, NEUTROABS, HGB, HCT, MCV, PLT in the last 72 hours. Basic Metabolic Panel No results for input(s): NA, K, CL, CO2, GLUCOSE, BUN, CREATININE, CALCIUM, MG, PHOS in the last 72 hours. Liver  Function Tests No results for input(s): AST, ALT, ALKPHOS, BILITOT, PROT, ALBUMIN in the last 72 hours. No results for input(s): LIPASE, AMYLASE in the last 72 hours. Cardiac Enzymes No results for input(s): CKTOTAL, CKMB, CKMBINDEX, TROPONINI in the last 72 hours. BNP Invalid input(s): POCBNP D-Dimer No results for input(s): DDIMER in the last 72 hours. Hemoglobin A1C No results for input(s): HGBA1C in the last 72 hours. Fasting Lipid Panel No results for input(s): CHOL, HDL, LDLCALC, TRIG, CHOLHDL, LDLDIRECT in the last 72 hours. Thyroid Function Tests No results for input(s): TSH, T4TOTAL, T3FREE, THYROIDAB in the last 72 hours.  Invalid input(s): FREET3 _____________  Dg Chest 2 View  Result Date: 08/16/2018 CLINICAL DATA:  Postop from pacemaker placement. EXAM: CHEST - 2 VIEW COMPARISON:  06/16/2018 FINDINGS: A new transvenous pacemaker seen with leads overlying the right atrium and right ventricle. No evidence of pneumothorax or pleural effusion. Both lungs are clear. Heart size remains normal. Aortic atherosclerosis. IMPRESSION: New transvenous pacemaker in appropriate position. No evidence of pneumothorax or other acute findings. Electronically Signed   By: 06/18/2018.D.  On: 08/16/2018 09:54   Disposition   Pt is being discharged home today in good condition.  Follow-up Plans & Appointments    Follow-up Information    [facility].   Specialty: Cardiology Why: 08/28/2018 @ 3:30PM, wound check visit Contact information: [address] [place]       Constance Haw, MD Follow up.   Specialty: Cardiology Why: 11/24/2018 @ 10:00AM Contact information: [place] [address] [phone]          Discharge Instructions    Call MD for:  redness, tenderness, or signs of infection (pain, swelling, redness, odor or green/yellow discharge around incision site)    Complete by: As directed    Diet - low sodium heart healthy   Complete by: As directed    Increase activity slowly   Complete by: As directed       Discharge Medications   Allergies as of 08/16/2018      Reactions   Aleve [naproxen Sodium] Anaphylaxis, Swelling   Facial and hand swelling   Procainamide Anaphylaxis, Swelling   Facial swelling   Rythmol [propafenone Hcl] Other (See Comments)   Bradycardia   Codeine Nausea And Vomiting   Shakiness   Doxycycline Other (See Comments)   Heart rythym issues   Isosorbide Nitrate Other (See Comments)   Headaches   Sertraline Hives, Rash   Large blisters. "Looked like I'd been burned".      Medication List    TAKE these medications   acetaminophen 500 MG tablet Commonly known as: TYLENOL Take 500 mg by mouth every 6 (six) hours as needed for mild pain or headache.   ALPRAZolam 1 MG tablet Commonly known as: XANAX Take 1 mg by mouth every 8 (eight) hours as needed for anxiety.   atorvastatin 40 MG tablet Commonly known as: LIPITOR Take 40 mg by mouth at bedtime.   clopidogrel 75 MG tablet Commonly known as: PLAVIX Take 75 mg by mouth daily.   Dexilant 60 MG capsule Generic drug: dexlansoprazole Take 60 mg by mouth daily.   fluticasone 50 MCG/ACT nasal spray Commonly known as: FLONASE Place 1 spray into both nostrils daily as needed for allergies. For allergies   furosemide 40 MG tablet Commonly known as: LASIX TAKE 1 TABLET BY MOUTH IN THE MORNING AND 1/2 TABLET IN THE EVENING What changed:   how much to take  how to take this  when to take this  additional instructions   hydroxypropyl methylcellulose / hypromellose 2.5 % ophthalmic solution Commonly known as: ISOPTO TEARS / GONIOVISC Place 2 drops into both eyes every 2 (two) hours as needed for dry eyes.   levothyroxine 75 MCG tablet Commonly known as: SYNTHROID Take 75 mcg by mouth daily before breakfast.   metFORMIN 500 MG tablet Commonly known  as: GLUCOPHAGE Take 500 mg by mouth 2 (two) times daily with a meal. Notes to patient: Hold metformin for 48 hours after pacemaker implanted then resume as usual.    nitroGLYCERIN 0.4 MG SL tablet Commonly known as: NITROSTAT Place 1 tablet (0.4 mg total) under the tongue every 5 (five) minutes as needed.   ondansetron 4 MG tablet Commonly known as: ZOFRAN Take 4 mg by mouth 3 (three) times daily as needed for nausea.   Oxycodone HCl 10 MG Tabs Take 5-10 mg by mouth every 6 (six) hours as needed (pain).   pioglitazone 45 MG tablet Commonly known  as: ACTOS Take 45 mg by mouth daily.        Acute coronary syndrome (MI, NSTEMI, STEMI, etc) this admission?: No.    Outstanding Labs/Studies   none  Duration of Discharge Encounter   Greater than 30 minutes including physician time.  Signed, Daune Perch, NP 08/16/2018, 10:42 AM   EP Attending  Patient seen and examined. Agree with the findings as noted above. The patient is doing well this morning. Her exam demonstrates no hematoma, RRR and clear lungs. Her CXR has been reviewed by me and demonstrates normal lead position with no evidence of PTX. Her PPM interrogation demonstrates normal device function. She will be discharged home with usual followup.   Mikle Bosworth.D.

## 2018-08-16 NOTE — Plan of Care (Signed)
  Problem: Education: Goal: Knowledge of cardiac device and self-care will improve Outcome: Adequate for Discharge Goal: Ability to safely manage health related needs after discharge will improve Outcome: Adequate for Discharge Goal: Individualized Educational Video(s) Outcome: Adequate for Discharge   Problem: Cardiac: Goal: Ability to achieve and maintain adequate cardiopulmonary perfusion will improve Outcome: Adequate for Discharge   Problem: Education: Goal: Knowledge of General Education information will improve Description: Including pain rating scale, medication(s)/side effects and non-pharmacologic comfort measures Outcome: Adequate for Discharge   Problem: Health Behavior/Discharge Planning: Goal: Ability to manage health-related needs will improve Outcome: Adequate for Discharge   Problem: Clinical Measurements: Goal: Will remain free from infection Outcome: Adequate for Discharge

## 2018-08-18 ENCOUNTER — Encounter (HOSPITAL_COMMUNITY): Payer: Self-pay | Admitting: Cardiology

## 2018-08-18 ENCOUNTER — Telehealth: Payer: Self-pay

## 2018-08-18 MED FILL — Gentamicin Sulfate Inj 40 MG/ML: INTRAMUSCULAR | Qty: 80 | Status: AC

## 2018-08-18 NOTE — Telephone Encounter (Signed)
Pt scheduled for DC 08/19/18.

## 2018-08-18 NOTE — Telephone Encounter (Signed)
Spoke w/ pt granddaughter, she states that the incision site/area has greatly increased in swelling since yesterday. Does not appear to have any drainage or redness. Granddaughter states that the area is a little warm. Requested photo via [place]. Pt is on Plavix and has continued taking since procedure. Advised pt and granddaughter to go to ED if pt develops fever/chills/drainage. Wound check appt scheduled for 08/28/18. Will forward to Dr. Curt Bears.

## 2018-08-18 NOTE — Telephone Encounter (Signed)
The pt ppm was implanted on 08-15-2018. The pt is having swelling over the device site. The pt states the incision site burns and inflamed. The pt wants to know can she put an ice pack on the site.The incision site is not red and the pt do not have a fever. The best number for the pt is [phone].

## 2018-08-18 NOTE — Telephone Encounter (Signed)
Needs to come to clinic to have incision evaluated.

## 2018-08-19 ENCOUNTER — Other Ambulatory Visit: Payer: Self-pay

## 2018-08-19 ENCOUNTER — Ambulatory Visit: Payer: Medicare Other | Admitting: *Deleted

## 2018-08-19 DIAGNOSIS — I441 Atrioventricular block, second degree: Secondary | ICD-10-CM

## 2018-08-19 LAB — CUP PACEART INCLINIC DEVICE CHECK
Date Time Interrogation Session: 20200818104853
Implantable Lead Implant Date: 20200814
Implantable Lead Implant Date: 20200814
Implantable Lead Location: 753859
Implantable Lead Location: 753860
Implantable Pulse Generator Implant Date: 20200814
Pulse Gen Model: 2272
Pulse Gen Serial Number: 9154171

## 2018-08-19 NOTE — Progress Notes (Signed)
Pt presents to device clinic w/ concerns of increased swelling over night on postop day 3. Swelling has since decreased. No changes per Dr. Curt Bears.

## 2018-08-21 DIAGNOSIS — N390 Urinary tract infection, site not specified: Secondary | ICD-10-CM | POA: Diagnosis not present

## 2018-08-21 DIAGNOSIS — N3289 Other specified disorders of bladder: Secondary | ICD-10-CM | POA: Diagnosis not present

## 2018-08-27 ENCOUNTER — Telehealth: Payer: Self-pay

## 2018-08-27 NOTE — Telephone Encounter (Signed)
    COVID-19 Pre-Screening Questions:  . In the past 7 to 10 days have you had a cough,  shortness of breath, headache, congestion, fever (100 or greater) body aches, chills, sore throat, or sudden loss of taste or sense of smell? no . Have you been around anyone with known Covid 19. no . Have you been around anyone who is awaiting Covid 19 test results in the past 7 to 10 days? no . Have you been around anyone who has been exposed to Covid 19, or has mentioned symptoms of Covid 19 within the past 7 to 10 days? no  If you have any concerns/questions about symptoms patients report during screening (either on the phone or at threshold). Contact the provider seeing the patient or DOD for further guidance.  If neither are available contact a member of the leadership team.         Pt answered no to all covid-19 prescreening questions. The pt let me know that her granddaughter will be coming to the appointment with her to help push the wheelchair. I told her her granddaughter can come with her. I told her to make sure her and the granddaughter where a mask. The pt verbalized understanding and thanked me for the call.

## 2018-08-28 ENCOUNTER — Other Ambulatory Visit: Payer: Self-pay

## 2018-08-28 ENCOUNTER — Ambulatory Visit (INDEPENDENT_AMBULATORY_CARE_PROVIDER_SITE_OTHER): Payer: Medicare Other | Admitting: *Deleted

## 2018-08-28 DIAGNOSIS — I441 Atrioventricular block, second degree: Secondary | ICD-10-CM

## 2018-08-28 DIAGNOSIS — Z950 Presence of cardiac pacemaker: Secondary | ICD-10-CM

## 2018-08-28 LAB — CUP PACEART INCLINIC DEVICE CHECK
Battery Remaining Longevity: 111 mo
Battery Voltage: 3.08 V
Brady Statistic RA Percent Paced: 1.1 %
Brady Statistic RV Percent Paced: 25 %
Date Time Interrogation Session: 20200827170232
Implantable Lead Implant Date: 20200814
Implantable Lead Implant Date: 20200814
Implantable Lead Location: 753859
Implantable Lead Location: 753860
Implantable Pulse Generator Implant Date: 20200814
Lead Channel Impedance Value: 362.5 Ohm
Lead Channel Impedance Value: 562.5 Ohm
Lead Channel Pacing Threshold Amplitude: 0.5 V
Lead Channel Pacing Threshold Amplitude: 0.75 V
Lead Channel Pacing Threshold Pulse Width: 0.5 ms
Lead Channel Pacing Threshold Pulse Width: 0.5 ms
Lead Channel Sensing Intrinsic Amplitude: 2.1 mV
Lead Channel Sensing Intrinsic Amplitude: 6.9 mV
Lead Channel Setting Pacing Amplitude: 3.5 V
Lead Channel Setting Pacing Amplitude: 3.5 V
Lead Channel Setting Pacing Pulse Width: 0.5 ms
Lead Channel Setting Sensing Sensitivity: 2 mV
Pulse Gen Model: 2272
Pulse Gen Serial Number: 9154171

## 2018-08-28 NOTE — Progress Notes (Signed)
Wound check appointment. Steri-strips removed. Wound without redness or edema. Incision edges approximated, wound well healed. Normal device function. Thresholds, sensing, and impedances consistent with implant measurements. Device programmed at 3.5V for extra safety margin until 3 month visit. Histogram distribution appropriate for patient and level of activity. No mode switches or high ventricular rates noted. Patient educated about wound care, arm mobility, lifting restrictions, and Merlin monitor. ROV with Dr. Curt Bears in [place] on 11/24/18.

## 2018-09-01 ENCOUNTER — Other Ambulatory Visit: Payer: Self-pay

## 2018-09-01 ENCOUNTER — Encounter: Payer: Self-pay | Admitting: Cardiology

## 2018-09-01 ENCOUNTER — Ambulatory Visit (INDEPENDENT_AMBULATORY_CARE_PROVIDER_SITE_OTHER): Payer: Medicare Other | Admitting: Cardiology

## 2018-09-01 VITALS — BP 130/72 | HR 110 | Ht 64.0 in | Wt 287.6 lb

## 2018-09-01 DIAGNOSIS — Z950 Presence of cardiac pacemaker: Secondary | ICD-10-CM

## 2018-09-01 DIAGNOSIS — I472 Ventricular tachycardia: Secondary | ICD-10-CM

## 2018-09-01 DIAGNOSIS — I10 Essential (primary) hypertension: Secondary | ICD-10-CM

## 2018-09-01 DIAGNOSIS — I4729 Other ventricular tachycardia: Secondary | ICD-10-CM

## 2018-09-01 DIAGNOSIS — I441 Atrioventricular block, second degree: Secondary | ICD-10-CM | POA: Diagnosis not present

## 2018-09-01 HISTORY — DX: Presence of cardiac pacemaker: Z95.0

## 2018-09-01 MED ORDER — METOPROLOL TARTRATE 25 MG PO TABS: 25.0000 mg | ORAL_TABLET | Freq: Two times a day (BID) | ORAL | 1 refills | Status: DC

## 2018-09-01 NOTE — Progress Notes (Signed)
Cardiology Office Note:    Date:  09/01/2018   ID:  Vickie Ferguson, DOB 1955-10-21, MRN [id number]  PCP:  Vickie Dress, MD  Cardiologist:  Vickie Campus, MD    Referring MD: Vickie Dress, MD   Chief Complaint  Patient presents with  . 1 month follow up    5 lbs fluid retenition    History of Present Illness:    Vickie Ferguson is a 63 y.o. female quite complex past medical history which include diabetes, hypertension, overweight, recently was identified to have second-degree type II AV block.  Pacemaker has been implanted since that time she is no passing out.  She still complain of having weakness and fatigue but overall felt better.  Described also to have some palpitations.  And she is asking me if we can restart her metoprolol.  I think is an excellent idea will do it with 25 metoprolol tartrate twice daily.  Past Medical History:  Diagnosis Date  . Anemia   . Anginal pain ([place])   . Arthritis   . CKD (chronic kidney disease), stage III ([place])   . Complication of anesthesia   . Diabetes mellitus without complication ([place])   . Family history of adverse reaction to anesthesia    " FAMILY MEMBERS HAD DIFFICULTY WAKING "  . Hypertension   . Hypothyroidism   . PONV (postoperative nausea and vomiting)   . Presence of permanent cardiac pacemaker 08/15/2018   St Jude Medical Assurity MRI dual-chamber pacemaker for symptomatic bradycardia  . Second degree AV block     Past Surgical History:  Procedure Laterality Date  . CARDIAC CATHETERIZATION    . INSERT / REPLACE / REMOVE PACEMAKER  08/15/2018   St Jude Medical Assurity MRI dual-chamber pacemaker for symptomatic bradycardia  . LEFT HEART CATH AND CORONARY ANGIOGRAPHY N/A 06/17/2018   Procedure: LEFT HEART CATH AND CORONARY ANGIOGRAPHY;  Surgeon: Martinique, Peter M, MD;  Location: [facility];  Service: Cardiovascular;  Laterality: N/A;  . PACEMAKER IMPLANT N/A 08/15/2018   Procedure: PACEMAKER IMPLANT;  Surgeon:  Constance Haw, MD;  Location: [facility];  Service: Cardiovascular;  Laterality: N/A;  . SHOULDER SURGERY Right 2012   METAL PLATE   . TOTAL KNEE ARTHROPLASTY Right 2001    Current Medications: Current Meds  Medication Sig  . acetaminophen (TYLENOL) 500 MG tablet Take 500 mg by mouth every 6 (six) hours as needed for mild pain or headache.  . ALPRAZolam (XANAX) 1 MG tablet Take 1 mg by mouth every 8 (eight) hours as needed for anxiety.   Marland Kitchen atorvastatin (LIPITOR) 40 MG tablet Take 40 mg by mouth at bedtime.    . clopidogrel (PLAVIX) 75 MG tablet Take 75 mg by mouth daily.   Marland Kitchen dexlansoprazole (DEXILANT) 60 MG capsule Take 60 mg by mouth daily.   . fluticasone (FLONASE) 50 MCG/ACT nasal spray Place 1 spray into both nostrils daily as needed for allergies. For allergies   . furosemide (LASIX) 40 MG tablet TAKE 1 TABLET BY MOUTH IN THE MORNING AND 1/2 TABLET IN THE EVENING (Patient taking differently: Take 20-40 mg by mouth See admin instructions. Take 40mg  in the morning and 20mg  in the evening. If gains 2 pounds in three days then take an extra 20mg  in the evening one time.)  . hydroxypropyl methylcellulose (ISOPTO TEARS) 2.5 % ophthalmic solution Place 2 drops into both eyes every 2 (two) hours as needed for dry eyes.   Marland Kitchen levothyroxine (  SYNTHROID, LEVOTHROID) 75 MCG tablet Take 75 mcg by mouth daily before breakfast.   . metFORMIN (GLUCOPHAGE) 500 MG tablet Take 500 mg by mouth 2 (two) times daily with a meal.   . nitroGLYCERIN (NITROSTAT) 0.4 MG SL tablet Place 1 tablet (0.4 mg total) under the tongue every 5 (five) minutes as needed.  . ondansetron (ZOFRAN) 4 MG tablet Take 4 mg by mouth 3 (three) times daily as needed for nausea.   . Oxycodone HCl 10 MG TABS Take 5-10 mg by mouth every 6 (six) hours as needed (pain).   . pioglitazone (ACTOS) 45 MG tablet Take 45 mg by mouth daily.       Allergies:   Aleve [naproxen sodium], Procainamide, Rythmol [propafenone hcl], Codeine,  Doxycycline, Isosorbide nitrate, and Sertraline   Social History   Socioeconomic History  . Marital status: Legally Separated    Spouse name: Not on file  . Number of children: Not on file  . Years of education: Not on file  . Highest education level: Not on file  Occupational History  . Not on file  Social Needs  . Financial resource strain: Not on file  . Food insecurity    Worry: Not on file    Inability: Not on file  . Transportation needs    Medical: Not on file    Non-medical: Not on file  Tobacco Use  . Smoking status: Never Smoker  . Smokeless tobacco: Never Used  Substance and Sexual Activity  . Alcohol use: No    Alcohol/week: 0.0 standard drinks  . Drug use: No  . Sexual activity: Not on file  Lifestyle  . Physical activity    Days per week: Not on file    Minutes per session: Not on file  . Stress: Not on file  Relationships  . Social Herbalist on phone: Not on file    Gets together: Not on file    Attends religious service: Not on file    Active member of club or organization: Not on file    Attends meetings of clubs or organizations: Not on file    Relationship status: Not on file  Other Topics Concern  . Not on file  Social History Narrative  . Not on file     Family History: [name] family history includes Heart disease in her mother. ROS:   Please see the history of present illness.    All 14 point review of systems negative except as described per history of present illness  EKGs/Labs/Other Studies Reviewed:      Recent Labs: 06/03/2018: ALT 5; NT-Pro BNP 577; TSH 9.100 06/17/2018: Magnesium 2.1 08/05/2018: BUN 23; Creatinine, Ser 1.56; Hemoglobin 10.6; Platelets 241; Potassium 4.7; Sodium 139  Recent Lipid Panel No results found for: CHOL, TRIG, HDL, CHOLHDL, VLDL, LDLCALC, LDLDIRECT  Physical Exam:    VS:  BP 130/72   Pulse (!) 110   Ht $R'5\' 4"'Jn$  (1.626 m)   Wt 287 lb 9.6 oz (130.5 kg)   SpO2 98%   BMI 49.37 kg/m      Wt Readings from Last 3 Encounters:  09/01/18 287 lb 9.6 oz (130.5 kg)  08/16/18 283 lb (128.4 kg)  08/05/18 285 lb (129.3 kg)     GEN:  Well nourished, well developed in no acute distress HEENT: Normal NECK: No JVD; No carotid bruits LYMPHATICS: No lymphadenopathy CARDIAC: RRR, no murmurs, no rubs, no gallops RESPIRATORY:  Clear to auscultation without rales, wheezing or rhonchi  ABDOMEN: Soft, non-tender, non-distended MUSCULOSKELETAL:  No edema; No deformity  SKIN: Warm and dry LOWER EXTREMITIES: no swelling NEUROLOGIC:  Alert and oriented x 3 PSYCHIATRIC:  Normal affect   ASSESSMENT:    1. Ventricular tachycardia (paroxysmal) ([place])   2. Second degree AV block   3. Essential hypertension   4. Pacemaker Saint Jude device    PLAN:    In order of problems listed above:  1. History of ventricular tachycardia beta-blocker will be reinitiated. 2. Second-degree AV block this being addressed with a pacemaker 3. Essential hypertension blood pressure well controlled continue present management 4. Surgery device recently implanted wounds look good healing well minimal bruising no swelling no pain.   Medication Adjustments/Labs and Tests Ordered: Current medicines are reviewed at length with the patient today.  Concerns regarding medicines are outlined above.  No orders of the defined types were placed in this encounter.  Medication changes:  Meds ordered this encounter  Medications  . metoprolol tartrate (LOPRESSOR) 25 MG tablet    Sig: Take 1 tablet (25 mg total) by mouth 2 (two) times daily.    Dispense:  180 tablet    Refill:  1    Signed, Park Liter, MD, [facility] 09/01/2018 [place]

## 2018-09-01 NOTE — Patient Instructions (Signed)
Medication Instructions:  Your physician has recommended you make the following change in your medication:    Start: metoprolol tartrate 25 mg twice daily.  If you need a refill on your cardiac medications before your next appointment, please call your pharmacy.   Lab work: None.  If you have labs (blood work) drawn today and your tests are completely normal, you will receive your results only by: Marland Kitchen MyChart Message (if you have MyChart) OR . A paper copy in the mail If you have any lab test that is abnormal or we need to change your treatment, we will call you to review the results.  Testing/Procedures: None.   Follow-Up: At [facility], you and your health needs are our priority.  As part of our continuing mission to provide you with exceptional heart care, we have created designated Provider Care Teams.  These Care Teams include your primary Cardiologist (physician) and Advanced Practice Providers (APPs -  Physician Assistants and Nurse Practitioners) who all work together to provide you with the care you need, when you need it. You will need a follow up appointment in 1 months.  Please call our office 2 months in advance to schedule this appointment.  You may see Jenne Campus, MD or another member of our [place] Provider Team in [place]: Shirlee More, MD . Jyl Heinz, MD  Any Other Special Instructions Will Be Listed Below (If Applicable).  Metoprolol tablets What is this medicine? METOPROLOL (me TOE proe lole) is a beta-blocker. Beta-blockers reduce the workload on the heart and help it to beat more regularly. This medicine is used to treat high blood pressure and to prevent chest pain. It is also used to after a heart attack and to prevent an additional heart attack from occurring. This medicine may be used for other purposes; ask your health care provider or pharmacist if you have questions. COMMON BRAND NAME(S): Lopressor What should I tell my health care  provider before I take this medicine? They need to know if you have any of these conditions:  diabetes  heart or vessel disease like slow heart rate, worsening heart failure, heart block, sick sinus syndrome or Raynaud's disease  kidney disease  liver disease  lung or breathing disease, like asthma or emphysema  pheochromocytoma  thyroid disease  an unusual or allergic reaction to metoprolol, other beta-blockers, medicines, foods, dyes, or preservatives  pregnant or trying to get pregnant  breast-feeding How should I use this medicine? Take this medicine by mouth with a drink of water. Follow the directions on the prescription label. Take this medicine immediately after meals. Take your doses at regular intervals. Do not take more medicine than directed. Do not stop taking this medicine suddenly. This could lead to serious heart-related effects. Talk to your pediatrician regarding the use of this medicine in children. Special care may be needed. Overdosage: If you think you have taken too much of this medicine contact a poison control center or emergency room at once. NOTE: This medicine is only for you. Do not share this medicine with others. What if I miss a dose? If you miss a dose, take it as soon as you can. If it is almost time for your next dose, take only that dose. Do not take double or extra doses. What may interact with this medicine? This medicine may interact with the following medications:  certain medicines for blood pressure, heart disease, irregular heart beat  certain medicines for depression like monoamine oxidase (MAO) inhibitors,  fluoxetine, or paroxetine  clonidine  dobutamine  epinephrine  isoproterenol  reserpine This list may not describe all possible interactions. Give your health care provider a list of all the medicines, herbs, non-prescription drugs, or dietary supplements you use. Also tell them if you smoke, drink alcohol, or use illegal  drugs. Some items may interact with your medicine. What should I watch for while using this medicine? Visit your doctor or health care professional for regular check ups. Contact your doctor right away if your symptoms worsen. Check your blood pressure and pulse rate regularly. Ask your health care professional what your blood pressure and pulse rate should be, and when you should contact them. You may get drowsy or dizzy. Do not drive, use machinery, or do anything that needs mental alertness until you know how this medicine affects you. Do not sit or stand up quickly, especially if you are an older patient. This reduces the risk of dizzy or fainting spells. Contact your doctor if these symptoms continue. Alcohol may interfere with the effect of this medicine. Avoid alcoholic drinks. This medicine may increase blood sugar. Ask your healthcare provider if changes in diet or medicines are needed if you have diabetes. What side effects may I notice from receiving this medicine? Side effects that you should report to your doctor or health care professional as soon as possible:  allergic reactions like skin rash, itching or hives  cold or numb hands or feet  depression  difficulty breathing  faint  fever with sore throat  irregular heartbeat, chest pain  rapid weight gain   signs and symptoms of high blood sugar such as being more thirsty or hungry or having to urinate more than normal. You may also feel very tired or have blurry vision.  swollen legs or ankles Side effects that usually do not require medical attention (report to your doctor or health care professional if they continue or are bothersome):  anxiety or nervousness  change in sex drive or performance  dry skin  headache  nightmares or trouble sleeping  short term memory loss  stomach upset or diarrhea This list may not describe all possible side effects. Call your doctor for medical advice about side effects. You  may report side effects to FDA at 1-800-FDA-1088. Where should I keep my medicine? Keep out of the reach of children. Store at room temperature between 15 and 30 degrees C (59 and 86 degrees F). Throw away any unused medicine after the expiration date. NOTE: This sheet is a summary. It may not cover all possible information. If you have questions about this medicine, talk to your doctor, pharmacist, or health care provider.  2020 Elsevier/Gold Standard (2017-10-08 11:15:23)

## 2018-09-04 ENCOUNTER — Telehealth: Payer: Self-pay | Admitting: Cardiology

## 2018-09-04 NOTE — Telephone Encounter (Signed)
Still having swelling, gained 3 pounds since Monday

## 2018-09-04 NOTE — Telephone Encounter (Signed)
Called patient she reports she has gained 3 lbs since Monday, however she doesn't notice any swelling in her legs, and denies shortness of breath. She is still taking 40 mg of lasix in the morning and 20 mg in the evening. Last night she took 40 mg in the evening hoping it would help but it hasn't.  She reports she just "hasn't felt good" the past week. She couldn't describe any further than that. Will consult with Dr. Agustin Cree and follow up with the patient.

## 2018-09-05 NOTE — Telephone Encounter (Signed)
No action yet, watch her weight

## 2018-09-05 NOTE — Telephone Encounter (Signed)
Called patient, informed her of Dr. Wendy Poet recommendation. She will call back if weight continues to go up. Overnight she had no gain.

## 2018-09-10 ENCOUNTER — Telehealth: Payer: Medicare Other | Admitting: Cardiology

## 2018-09-29 DIAGNOSIS — E1122 Type 2 diabetes mellitus with diabetic chronic kidney disease: Secondary | ICD-10-CM | POA: Diagnosis not present

## 2018-09-29 DIAGNOSIS — E785 Hyperlipidemia, unspecified: Secondary | ICD-10-CM | POA: Diagnosis not present

## 2018-09-29 DIAGNOSIS — I10 Essential (primary) hypertension: Secondary | ICD-10-CM | POA: Diagnosis not present

## 2018-09-29 DIAGNOSIS — N183 Chronic kidney disease, stage 3 (moderate): Secondary | ICD-10-CM | POA: Diagnosis not present

## 2018-09-29 DIAGNOSIS — Z23 Encounter for immunization: Secondary | ICD-10-CM | POA: Diagnosis not present

## 2018-10-03 DIAGNOSIS — D539 Nutritional anemia, unspecified: Secondary | ICD-10-CM | POA: Diagnosis not present

## 2018-10-03 DIAGNOSIS — E1122 Type 2 diabetes mellitus with diabetic chronic kidney disease: Secondary | ICD-10-CM | POA: Diagnosis not present

## 2018-10-03 DIAGNOSIS — E785 Hyperlipidemia, unspecified: Secondary | ICD-10-CM | POA: Diagnosis not present

## 2018-10-03 DIAGNOSIS — I10 Essential (primary) hypertension: Secondary | ICD-10-CM | POA: Diagnosis not present

## 2018-10-07 DIAGNOSIS — N189 Chronic kidney disease, unspecified: Secondary | ICD-10-CM | POA: Diagnosis not present

## 2018-10-07 DIAGNOSIS — D631 Anemia in chronic kidney disease: Secondary | ICD-10-CM | POA: Diagnosis not present

## 2018-10-07 DIAGNOSIS — E611 Iron deficiency: Secondary | ICD-10-CM | POA: Diagnosis not present

## 2018-10-07 DIAGNOSIS — D509 Iron deficiency anemia, unspecified: Secondary | ICD-10-CM | POA: Diagnosis not present

## 2018-10-08 DIAGNOSIS — E876 Hypokalemia: Secondary | ICD-10-CM | POA: Diagnosis not present

## 2018-10-08 DIAGNOSIS — Z87440 Personal history of urinary (tract) infections: Secondary | ICD-10-CM | POA: Diagnosis not present

## 2018-10-08 DIAGNOSIS — F419 Anxiety disorder, unspecified: Secondary | ICD-10-CM | POA: Diagnosis not present

## 2018-10-08 DIAGNOSIS — D631 Anemia in chronic kidney disease: Secondary | ICD-10-CM | POA: Diagnosis not present

## 2018-10-08 DIAGNOSIS — N183 Chronic kidney disease, stage 3 unspecified: Secondary | ICD-10-CM | POA: Diagnosis not present

## 2018-10-08 DIAGNOSIS — Z6841 Body Mass Index (BMI) 40.0 and over, adult: Secondary | ICD-10-CM | POA: Diagnosis not present

## 2018-10-08 DIAGNOSIS — E1122 Type 2 diabetes mellitus with diabetic chronic kidney disease: Secondary | ICD-10-CM | POA: Diagnosis not present

## 2018-10-08 DIAGNOSIS — M1991 Primary osteoarthritis, unspecified site: Secondary | ICD-10-CM | POA: Diagnosis not present

## 2018-10-08 DIAGNOSIS — R002 Palpitations: Secondary | ICD-10-CM | POA: Diagnosis not present

## 2018-10-08 DIAGNOSIS — E785 Hyperlipidemia, unspecified: Secondary | ICD-10-CM | POA: Diagnosis not present

## 2018-10-08 DIAGNOSIS — R5383 Other fatigue: Secondary | ICD-10-CM | POA: Diagnosis not present

## 2018-10-08 DIAGNOSIS — F329 Major depressive disorder, single episode, unspecified: Secondary | ICD-10-CM | POA: Diagnosis not present

## 2018-10-08 DIAGNOSIS — G8929 Other chronic pain: Secondary | ICD-10-CM | POA: Diagnosis not present

## 2018-10-08 DIAGNOSIS — E039 Hypothyroidism, unspecified: Secondary | ICD-10-CM | POA: Diagnosis not present

## 2018-10-08 DIAGNOSIS — R7401 Elevation of levels of liver transaminase levels: Secondary | ICD-10-CM | POA: Diagnosis not present

## 2018-10-08 DIAGNOSIS — Z79891 Long term (current) use of opiate analgesic: Secondary | ICD-10-CM | POA: Diagnosis not present

## 2018-10-08 DIAGNOSIS — G4700 Insomnia, unspecified: Secondary | ICD-10-CM | POA: Diagnosis not present

## 2018-10-08 DIAGNOSIS — Z8673 Personal history of transient ischemic attack (TIA), and cerebral infarction without residual deficits: Secondary | ICD-10-CM | POA: Diagnosis not present

## 2018-10-08 DIAGNOSIS — R262 Difficulty in walking, not elsewhere classified: Secondary | ICD-10-CM | POA: Diagnosis not present

## 2018-10-08 DIAGNOSIS — I129 Hypertensive chronic kidney disease with stage 1 through stage 4 chronic kidney disease, or unspecified chronic kidney disease: Secondary | ICD-10-CM | POA: Diagnosis not present

## 2018-10-08 DIAGNOSIS — K219 Gastro-esophageal reflux disease without esophagitis: Secondary | ICD-10-CM | POA: Diagnosis not present

## 2018-10-08 DIAGNOSIS — R479 Unspecified speech disturbances: Secondary | ICD-10-CM | POA: Diagnosis not present

## 2018-10-08 DIAGNOSIS — Z7984 Long term (current) use of oral hypoglycemic drugs: Secondary | ICD-10-CM | POA: Diagnosis not present

## 2018-10-08 DIAGNOSIS — K589 Irritable bowel syndrome without diarrhea: Secondary | ICD-10-CM | POA: Diagnosis not present

## 2018-10-14 ENCOUNTER — Other Ambulatory Visit: Payer: Self-pay

## 2018-10-15 DIAGNOSIS — D509 Iron deficiency anemia, unspecified: Secondary | ICD-10-CM | POA: Diagnosis not present

## 2018-10-17 DIAGNOSIS — D631 Anemia in chronic kidney disease: Secondary | ICD-10-CM | POA: Diagnosis not present

## 2018-10-17 DIAGNOSIS — E039 Hypothyroidism, unspecified: Secondary | ICD-10-CM | POA: Diagnosis not present

## 2018-10-17 DIAGNOSIS — F419 Anxiety disorder, unspecified: Secondary | ICD-10-CM | POA: Diagnosis not present

## 2018-10-17 DIAGNOSIS — I129 Hypertensive chronic kidney disease with stage 1 through stage 4 chronic kidney disease, or unspecified chronic kidney disease: Secondary | ICD-10-CM | POA: Diagnosis not present

## 2018-10-17 DIAGNOSIS — E1122 Type 2 diabetes mellitus with diabetic chronic kidney disease: Secondary | ICD-10-CM | POA: Diagnosis not present

## 2018-10-17 DIAGNOSIS — N183 Chronic kidney disease, stage 3 unspecified: Secondary | ICD-10-CM | POA: Diagnosis not present

## 2018-10-21 DIAGNOSIS — E039 Hypothyroidism, unspecified: Secondary | ICD-10-CM | POA: Diagnosis not present

## 2018-10-21 DIAGNOSIS — E1122 Type 2 diabetes mellitus with diabetic chronic kidney disease: Secondary | ICD-10-CM | POA: Diagnosis not present

## 2018-10-21 DIAGNOSIS — F419 Anxiety disorder, unspecified: Secondary | ICD-10-CM | POA: Diagnosis not present

## 2018-10-21 DIAGNOSIS — D631 Anemia in chronic kidney disease: Secondary | ICD-10-CM | POA: Diagnosis not present

## 2018-10-21 DIAGNOSIS — I129 Hypertensive chronic kidney disease with stage 1 through stage 4 chronic kidney disease, or unspecified chronic kidney disease: Secondary | ICD-10-CM | POA: Diagnosis not present

## 2018-10-21 DIAGNOSIS — N183 Chronic kidney disease, stage 3 unspecified: Secondary | ICD-10-CM | POA: Diagnosis not present

## 2018-10-22 DIAGNOSIS — D509 Iron deficiency anemia, unspecified: Secondary | ICD-10-CM | POA: Diagnosis not present

## 2018-10-23 DIAGNOSIS — D631 Anemia in chronic kidney disease: Secondary | ICD-10-CM | POA: Diagnosis not present

## 2018-10-23 DIAGNOSIS — E1122 Type 2 diabetes mellitus with diabetic chronic kidney disease: Secondary | ICD-10-CM | POA: Diagnosis not present

## 2018-10-23 DIAGNOSIS — N183 Chronic kidney disease, stage 3 unspecified: Secondary | ICD-10-CM | POA: Diagnosis not present

## 2018-10-23 DIAGNOSIS — F419 Anxiety disorder, unspecified: Secondary | ICD-10-CM | POA: Diagnosis not present

## 2018-10-23 DIAGNOSIS — I129 Hypertensive chronic kidney disease with stage 1 through stage 4 chronic kidney disease, or unspecified chronic kidney disease: Secondary | ICD-10-CM | POA: Diagnosis not present

## 2018-10-23 DIAGNOSIS — E039 Hypothyroidism, unspecified: Secondary | ICD-10-CM | POA: Diagnosis not present

## 2018-10-24 DIAGNOSIS — D631 Anemia in chronic kidney disease: Secondary | ICD-10-CM | POA: Diagnosis not present

## 2018-10-24 DIAGNOSIS — F419 Anxiety disorder, unspecified: Secondary | ICD-10-CM | POA: Diagnosis not present

## 2018-10-24 DIAGNOSIS — E1122 Type 2 diabetes mellitus with diabetic chronic kidney disease: Secondary | ICD-10-CM | POA: Diagnosis not present

## 2018-10-24 DIAGNOSIS — N183 Chronic kidney disease, stage 3 unspecified: Secondary | ICD-10-CM | POA: Diagnosis not present

## 2018-10-24 DIAGNOSIS — I129 Hypertensive chronic kidney disease with stage 1 through stage 4 chronic kidney disease, or unspecified chronic kidney disease: Secondary | ICD-10-CM | POA: Diagnosis not present

## 2018-10-24 DIAGNOSIS — E039 Hypothyroidism, unspecified: Secondary | ICD-10-CM | POA: Diagnosis not present

## 2018-10-27 DIAGNOSIS — I129 Hypertensive chronic kidney disease with stage 1 through stage 4 chronic kidney disease, or unspecified chronic kidney disease: Secondary | ICD-10-CM | POA: Diagnosis not present

## 2018-10-27 DIAGNOSIS — D631 Anemia in chronic kidney disease: Secondary | ICD-10-CM | POA: Diagnosis not present

## 2018-10-27 DIAGNOSIS — E1122 Type 2 diabetes mellitus with diabetic chronic kidney disease: Secondary | ICD-10-CM | POA: Diagnosis not present

## 2018-10-27 DIAGNOSIS — F419 Anxiety disorder, unspecified: Secondary | ICD-10-CM | POA: Diagnosis not present

## 2018-10-27 DIAGNOSIS — N183 Chronic kidney disease, stage 3 unspecified: Secondary | ICD-10-CM | POA: Diagnosis not present

## 2018-10-27 DIAGNOSIS — E039 Hypothyroidism, unspecified: Secondary | ICD-10-CM | POA: Diagnosis not present

## 2018-10-29 ENCOUNTER — Telehealth (INDEPENDENT_AMBULATORY_CARE_PROVIDER_SITE_OTHER): Payer: Medicare Other | Admitting: Cardiology

## 2018-10-29 ENCOUNTER — Encounter: Payer: Self-pay | Admitting: Cardiology

## 2018-10-29 ENCOUNTER — Other Ambulatory Visit: Payer: Self-pay

## 2018-10-29 VITALS — BP 114/66 | HR 85 | Wt 285.0 lb

## 2018-10-29 DIAGNOSIS — Z950 Presence of cardiac pacemaker: Secondary | ICD-10-CM

## 2018-10-29 DIAGNOSIS — F419 Anxiety disorder, unspecified: Secondary | ICD-10-CM | POA: Diagnosis not present

## 2018-10-29 DIAGNOSIS — D631 Anemia in chronic kidney disease: Secondary | ICD-10-CM | POA: Diagnosis not present

## 2018-10-29 DIAGNOSIS — I441 Atrioventricular block, second degree: Secondary | ICD-10-CM

## 2018-10-29 DIAGNOSIS — E1122 Type 2 diabetes mellitus with diabetic chronic kidney disease: Secondary | ICD-10-CM | POA: Diagnosis not present

## 2018-10-29 DIAGNOSIS — N183 Chronic kidney disease, stage 3 unspecified: Secondary | ICD-10-CM | POA: Diagnosis not present

## 2018-10-29 DIAGNOSIS — I129 Hypertensive chronic kidney disease with stage 1 through stage 4 chronic kidney disease, or unspecified chronic kidney disease: Secondary | ICD-10-CM | POA: Diagnosis not present

## 2018-10-29 DIAGNOSIS — E039 Hypothyroidism, unspecified: Secondary | ICD-10-CM | POA: Diagnosis not present

## 2018-10-29 DIAGNOSIS — G459 Transient cerebral ischemic attack, unspecified: Secondary | ICD-10-CM

## 2018-10-29 DIAGNOSIS — D509 Iron deficiency anemia, unspecified: Secondary | ICD-10-CM

## 2018-10-29 DIAGNOSIS — R072 Precordial pain: Secondary | ICD-10-CM

## 2018-10-29 DIAGNOSIS — I10 Essential (primary) hypertension: Secondary | ICD-10-CM | POA: Diagnosis not present

## 2018-10-29 NOTE — Progress Notes (Signed)
Virtual Visit via Telephone Note   This visit type was conducted due to national recommendations for restrictions regarding the COVID-19 Pandemic (e.g. social distancing) in an effort to limit this patient's exposure and mitigate transmission in our community.  Due to her co-morbid illnesses, this patient is at least at moderate risk for complications without adequate follow up.  This format is felt to be most appropriate for this patient at this time.  The patient did not have access to video technology/had technical difficulties with video requiring transitioning to audio format only (telephone).  All issues noted in this document were discussed and addressed.  No physical exam could be performed with this format.  Please refer to the patient's chart for her  consent to telehealth for [facility].  Evaluation Performed:  Follow-up visit  This visit type was conducted due to national recommendations for restrictions regarding the COVID-19 Pandemic (e.g. social distancing).  This format is felt to be most appropriate for this patient at this time.  All issues noted in this document were discussed and addressed.  No physical exam was performed (except for noted visual exam findings with Video Visits).  Please refer to the patient's chart (MyChart message for video visits and phone note for telephone visits) for the patient's consent to telehealth for [facility].  Date:  10/29/2018  ID: Vickie Ferguson, DOB January 11, 1955, MRN [id number]   Patient Location: [place] [address]   Provider location:   [place] Office  PCP:  Nicoletta Dress, MD  Cardiologist:  Jenne Campus, MD     Chief Complaint: Doing well cardiac wise  History of Present Illness:    Vickie Ferguson is a 63 y.o. female  who presents via audio/video conferencing for a telehealth visit today.  Complex past medical history second-degree AV block status post pacemaker implantation,  palpitations, morbid obesity, chronic kidney disease, essential hypertension she does have a virtual visit with me today.  She was unable to manage to come to the office.  We talk only over the phone.  Seems to be doing well cardiac wise.  She is participating in rehab try to build up his stamina.  Apparently she also was find to have percent possibly old stroke and she got some cognitive issues and that being worked as well.  She however have normal regular conversation with me.  Last time we put her back on beta-blocker with good response.  She does not have any more palpitations.  Overall seems to be doing well.   The patient does not have symptoms concerning for COVID-19 infection (fever, chills, cough, or new SHORTNESS OF BREATH).    Prior CV studies:   The following studies were reviewed today:       Past Medical History:  Diagnosis Date  . Anemia   . Anginal pain ([place])   . Arthritis   . CKD (chronic kidney disease), stage III   . Complication of anesthesia   . Diabetes mellitus without complication ([place])   . Family history of adverse reaction to anesthesia    " FAMILY MEMBERS HAD DIFFICULTY WAKING "  . Hypertension   . Hypothyroidism   . PONV (postoperative nausea and vomiting)   . Presence of permanent cardiac pacemaker 08/15/2018   [employer] Jude [employer]urity MRI dual-chamber pacemaker for symptomatic bradycardia  . Second degree AV block     Past Surgical History:  Procedure Laterality Date  . CARDIAC CATHETERIZATION    .  INSERT / REPLACE / REMOVE PACEMAKER  08/15/2018   St Jude Medical Assurity MRI dual-chamber pacemaker for symptomatic bradycardia  . LEFT HEART CATH AND CORONARY ANGIOGRAPHY N/A 06/17/2018   Procedure: LEFT HEART CATH AND CORONARY ANGIOGRAPHY;  Surgeon: Martinique, Peter M, MD;  Location: [place] [facility];  Service: Cardiovascular;  Laterality: N/A;  . PACEMAKER IMPLANT N/A 08/15/2018   Procedure: PACEMAKER IMPLANT;  Surgeon: Constance Haw, MD;   Location: [place] [facility];  Service: Cardiovascular;  Laterality: N/A;  . SHOULDER SURGERY Right 2012   METAL PLATE   . TOTAL KNEE ARTHROPLASTY Right 2001     Current Meds  Medication Sig  . acetaminophen (TYLENOL) 500 MG tablet Take 500 mg by mouth every 6 (six) hours as needed for mild pain or headache.  . ALPRAZolam (XANAX) 1 MG tablet Take 1 mg by mouth every 8 (eight) hours as needed for anxiety.   Marland Kitchen atorvastatin (LIPITOR) 40 MG tablet Take 40 mg by mouth at bedtime.    . clopidogrel (PLAVIX) 75 MG tablet Take 75 mg by mouth daily.   Marland Kitchen dexlansoprazole (DEXILANT) 60 MG capsule Take 60 mg by mouth daily.   . fluticasone (FLONASE) 50 MCG/ACT nasal spray Place 1 spray into both nostrils daily as needed for allergies. For allergies   . furosemide (LASIX) 40 MG tablet TAKE 1 TABLET BY MOUTH IN THE MORNING AND 1/2 TABLET IN THE EVENING (Patient taking differently: Take 20-40 mg by mouth See admin instructions. Take 40mg  in the morning and 20mg  in the evening. If gains 2 pounds in three days then take an extra 20mg  in the evening one time.)  . hydroxypropyl methylcellulose (ISOPTO TEARS) 2.5 % ophthalmic solution Place 2 drops into both eyes every 2 (two) hours as needed for dry eyes.   Marland Kitchen levothyroxine (SYNTHROID, LEVOTHROID) 75 MCG tablet Take 75 mcg by mouth daily before breakfast.   . metFORMIN (GLUCOPHAGE) 500 MG tablet Take 500 mg by mouth 2 (two) times daily with a meal.   . metoprolol tartrate (LOPRESSOR) 25 MG tablet Take 1 tablet (25 mg total) by mouth 2 (two) times daily.  . nitroGLYCERIN (NITROSTAT) 0.4 MG SL tablet Place 1 tablet (0.4 mg total) under the tongue every 5 (five) minutes as needed.  . ondansetron (ZOFRAN) 4 MG tablet Take 4 mg by mouth 3 (three) times daily as needed for nausea.   . Oxycodone HCl 10 MG TABS Take 5-10 mg by mouth every 6 (six) hours as needed (pain).   . pioglitazone (ACTOS) 45 MG tablet Take 45 mg by mouth daily.        Family History: The  patient's family history includes Heart disease in her mother.   ROS:   Please see the history of present illness.     All other systems reviewed and are negative.   Labs/Other Tests and Data Reviewed:     Recent Labs: 06/03/2018: ALT 5; NT-Pro BNP 577; TSH 9.100 06/17/2018: Magnesium 2.1 08/05/2018: BUN 23; Creatinine, Ser 1.56; Hemoglobin 10.6; Platelets 241; Potassium 4.7; Sodium 139  Recent Lipid Panel No results found for: CHOL, TRIG, HDL, CHOLHDL, VLDL, LDLCALC, LDLDIRECT    Exam:    Vital Signs:  BP 114/66   Pulse 85   Wt 285 lb (129.3 kg)   BMI 48.92 kg/m     Wt Readings from Last 3 Encounters:  10/29/18 285 lb (129.3 kg)  09/01/18 287 lb 9.6 oz (130.5 kg)  08/16/18 283 lb (128.4 kg)  Well nourished, well developed in no acute distress. Alert awake and x3 talking to me over the phone unable to establish video link doing well not in distress at the time of my interview  Diagnosis for this visit:   1. Essential hypertension   2. Pacemaker [place] Jude device   3. Precordial pain   4. Iron deficiency anemia, unspecified iron deficiency anemia type   5. TIA (transient ischemic attack)   6. Second degree AV block      ASSESSMENT & PLAN:    1.  Essential hypertension blood pressure well controlled continue present management. Pacemaker present she is scheduled to see our EP team in November. Precordial pain denies having any 4.  Iron deficiency anemia recent infusion with iron done by hematology team.  However she does not feel any stronger since then 5.  Questionable history of TIA.  That being followed by neurology. 6.  Second-degree AV block.  That being followed by our EP team.  COVID-19 Education: The signs and symptoms of COVID-19 were discussed with the patient and how to seek care for testing (follow up with PCP or arrange E-visit).  The importance of social distancing was discussed today.  Patient Risk:   After full review of this patients clinical  status, I feel that they are at least moderate risk at this time.  Time:   Today, I have spent 5 minutes with the patient with telehealth technology discussing pt health issues.  I spent 15 minutes reviewing her chart before the visit.  Visit was finished at 10:20 AM.    Medication Adjustments/Labs and Tests Ordered: Current medicines are reviewed at length with the patient today.  Concerns regarding medicines are outlined above.  No orders of the defined types were placed in this encounter.  Medication changes: No orders of the defined types were placed in this encounter.    Disposition: Follow-up in 3 months  Signed, Park Liter, MD, [facility] 10/29/2018 10:18 AM    [place]

## 2018-10-29 NOTE — Patient Instructions (Signed)
Medication Instructions:  Your physician recommends that you continue on your current medications as directed. Please refer to the Current Medication list given to you today.  *If you need a refill on your cardiac medications before your next appointment, please call your pharmacy*  Lab Work: None.  If you have labs (blood work) drawn today and your tests are completely normal, you will receive your results only by: Marland Kitchen MyChart Message (if you have MyChart) OR . A paper copy in the mail If you have any lab test that is abnormal or we need to change your treatment, we will call you to review the results.  Testing/Procedures: None.   Follow-Up: At [facility], you and your health needs are our priority.  As part of our continuing mission to provide you with exceptional heart care, we have created designated Provider Care Teams.  These Care Teams include your primary Cardiologist (physician) and Advanced Practice Providers (APPs -  Physician Assistants and Nurse Practitioners) who all work together to provide you with the care you need, when you need it.  Your next appointment:   3 months  The format for your next appointment:   In Person  Provider:   You will see Jenne Campus, MD.  Or, you can be scheduled with the following Advanced Practice Provider on your designated Care Team (at our [facility]):  Laurann [place], FNP    Other Instructions

## 2018-10-31 DIAGNOSIS — N183 Chronic kidney disease, stage 3 unspecified: Secondary | ICD-10-CM | POA: Diagnosis not present

## 2018-10-31 DIAGNOSIS — I129 Hypertensive chronic kidney disease with stage 1 through stage 4 chronic kidney disease, or unspecified chronic kidney disease: Secondary | ICD-10-CM | POA: Diagnosis not present

## 2018-10-31 DIAGNOSIS — D631 Anemia in chronic kidney disease: Secondary | ICD-10-CM | POA: Diagnosis not present

## 2018-10-31 DIAGNOSIS — E039 Hypothyroidism, unspecified: Secondary | ICD-10-CM | POA: Diagnosis not present

## 2018-10-31 DIAGNOSIS — F419 Anxiety disorder, unspecified: Secondary | ICD-10-CM | POA: Diagnosis not present

## 2018-10-31 DIAGNOSIS — E1122 Type 2 diabetes mellitus with diabetic chronic kidney disease: Secondary | ICD-10-CM | POA: Diagnosis not present

## 2018-11-06 DIAGNOSIS — F419 Anxiety disorder, unspecified: Secondary | ICD-10-CM | POA: Diagnosis not present

## 2018-11-06 DIAGNOSIS — I129 Hypertensive chronic kidney disease with stage 1 through stage 4 chronic kidney disease, or unspecified chronic kidney disease: Secondary | ICD-10-CM | POA: Diagnosis not present

## 2018-11-06 DIAGNOSIS — E1122 Type 2 diabetes mellitus with diabetic chronic kidney disease: Secondary | ICD-10-CM | POA: Diagnosis not present

## 2018-11-06 DIAGNOSIS — E039 Hypothyroidism, unspecified: Secondary | ICD-10-CM | POA: Diagnosis not present

## 2018-11-06 DIAGNOSIS — D631 Anemia in chronic kidney disease: Secondary | ICD-10-CM | POA: Diagnosis not present

## 2018-11-06 DIAGNOSIS — N183 Chronic kidney disease, stage 3 unspecified: Secondary | ICD-10-CM | POA: Diagnosis not present

## 2018-11-07 DIAGNOSIS — Z6841 Body Mass Index (BMI) 40.0 and over, adult: Secondary | ICD-10-CM | POA: Diagnosis not present

## 2018-11-07 DIAGNOSIS — R479 Unspecified speech disturbances: Secondary | ICD-10-CM | POA: Diagnosis not present

## 2018-11-07 DIAGNOSIS — R262 Difficulty in walking, not elsewhere classified: Secondary | ICD-10-CM | POA: Diagnosis not present

## 2018-11-07 DIAGNOSIS — E039 Hypothyroidism, unspecified: Secondary | ICD-10-CM | POA: Diagnosis not present

## 2018-11-07 DIAGNOSIS — E785 Hyperlipidemia, unspecified: Secondary | ICD-10-CM | POA: Diagnosis not present

## 2018-11-07 DIAGNOSIS — I129 Hypertensive chronic kidney disease with stage 1 through stage 4 chronic kidney disease, or unspecified chronic kidney disease: Secondary | ICD-10-CM | POA: Diagnosis not present

## 2018-11-07 DIAGNOSIS — N183 Chronic kidney disease, stage 3 unspecified: Secondary | ICD-10-CM | POA: Diagnosis not present

## 2018-11-07 DIAGNOSIS — Z8673 Personal history of transient ischemic attack (TIA), and cerebral infarction without residual deficits: Secondary | ICD-10-CM | POA: Diagnosis not present

## 2018-11-07 DIAGNOSIS — Z79891 Long term (current) use of opiate analgesic: Secondary | ICD-10-CM | POA: Diagnosis not present

## 2018-11-07 DIAGNOSIS — R5383 Other fatigue: Secondary | ICD-10-CM | POA: Diagnosis not present

## 2018-11-07 DIAGNOSIS — Z7984 Long term (current) use of oral hypoglycemic drugs: Secondary | ICD-10-CM | POA: Diagnosis not present

## 2018-11-07 DIAGNOSIS — E876 Hypokalemia: Secondary | ICD-10-CM | POA: Diagnosis not present

## 2018-11-07 DIAGNOSIS — K589 Irritable bowel syndrome without diarrhea: Secondary | ICD-10-CM | POA: Diagnosis not present

## 2018-11-07 DIAGNOSIS — E1122 Type 2 diabetes mellitus with diabetic chronic kidney disease: Secondary | ICD-10-CM | POA: Diagnosis not present

## 2018-11-07 DIAGNOSIS — G8929 Other chronic pain: Secondary | ICD-10-CM | POA: Diagnosis not present

## 2018-11-07 DIAGNOSIS — G4700 Insomnia, unspecified: Secondary | ICD-10-CM | POA: Diagnosis not present

## 2018-11-07 DIAGNOSIS — D631 Anemia in chronic kidney disease: Secondary | ICD-10-CM | POA: Diagnosis not present

## 2018-11-07 DIAGNOSIS — R7401 Elevation of levels of liver transaminase levels: Secondary | ICD-10-CM | POA: Diagnosis not present

## 2018-11-07 DIAGNOSIS — Z87440 Personal history of urinary (tract) infections: Secondary | ICD-10-CM | POA: Diagnosis not present

## 2018-11-07 DIAGNOSIS — F419 Anxiety disorder, unspecified: Secondary | ICD-10-CM | POA: Diagnosis not present

## 2018-11-07 DIAGNOSIS — F329 Major depressive disorder, single episode, unspecified: Secondary | ICD-10-CM | POA: Diagnosis not present

## 2018-11-07 DIAGNOSIS — K219 Gastro-esophageal reflux disease without esophagitis: Secondary | ICD-10-CM | POA: Diagnosis not present

## 2018-11-07 DIAGNOSIS — M1991 Primary osteoarthritis, unspecified site: Secondary | ICD-10-CM | POA: Diagnosis not present

## 2018-11-07 DIAGNOSIS — R002 Palpitations: Secondary | ICD-10-CM | POA: Diagnosis not present

## 2018-11-11 DIAGNOSIS — E1122 Type 2 diabetes mellitus with diabetic chronic kidney disease: Secondary | ICD-10-CM | POA: Diagnosis not present

## 2018-11-11 DIAGNOSIS — I129 Hypertensive chronic kidney disease with stage 1 through stage 4 chronic kidney disease, or unspecified chronic kidney disease: Secondary | ICD-10-CM | POA: Diagnosis not present

## 2018-11-11 DIAGNOSIS — D631 Anemia in chronic kidney disease: Secondary | ICD-10-CM | POA: Diagnosis not present

## 2018-11-11 DIAGNOSIS — N183 Chronic kidney disease, stage 3 unspecified: Secondary | ICD-10-CM | POA: Diagnosis not present

## 2018-11-11 DIAGNOSIS — F419 Anxiety disorder, unspecified: Secondary | ICD-10-CM | POA: Diagnosis not present

## 2018-11-11 DIAGNOSIS — E039 Hypothyroidism, unspecified: Secondary | ICD-10-CM | POA: Diagnosis not present

## 2018-11-18 DIAGNOSIS — E039 Hypothyroidism, unspecified: Secondary | ICD-10-CM | POA: Diagnosis not present

## 2018-11-18 DIAGNOSIS — F419 Anxiety disorder, unspecified: Secondary | ICD-10-CM | POA: Diagnosis not present

## 2018-11-18 DIAGNOSIS — E1122 Type 2 diabetes mellitus with diabetic chronic kidney disease: Secondary | ICD-10-CM | POA: Diagnosis not present

## 2018-11-18 DIAGNOSIS — N183 Chronic kidney disease, stage 3 unspecified: Secondary | ICD-10-CM | POA: Diagnosis not present

## 2018-11-18 DIAGNOSIS — D631 Anemia in chronic kidney disease: Secondary | ICD-10-CM | POA: Diagnosis not present

## 2018-11-18 DIAGNOSIS — I129 Hypertensive chronic kidney disease with stage 1 through stage 4 chronic kidney disease, or unspecified chronic kidney disease: Secondary | ICD-10-CM | POA: Diagnosis not present

## 2018-11-24 ENCOUNTER — Other Ambulatory Visit: Payer: Self-pay

## 2018-11-24 ENCOUNTER — Ambulatory Visit (INDEPENDENT_AMBULATORY_CARE_PROVIDER_SITE_OTHER): Payer: Medicare Other | Admitting: Cardiology

## 2018-11-24 ENCOUNTER — Encounter: Payer: Self-pay | Admitting: Cardiology

## 2018-11-24 VITALS — BP 138/86 | HR 66 | Ht 64.0 in | Wt 290.4 lb

## 2018-11-24 DIAGNOSIS — I441 Atrioventricular block, second degree: Secondary | ICD-10-CM | POA: Diagnosis not present

## 2018-11-24 DIAGNOSIS — Z950 Presence of cardiac pacemaker: Secondary | ICD-10-CM

## 2018-11-24 LAB — CUP PACEART INCLINIC DEVICE CHECK
Battery Remaining Longevity: 130 mo
Battery Voltage: 3.04 V
Brady Statistic RA Percent Paced: 10 %
Brady Statistic RV Percent Paced: 20 %
Date Time Interrogation Session: 20201123121038
Implantable Lead Implant Date: 20200814
Implantable Lead Implant Date: 20200814
Implantable Lead Location: 753859
Implantable Lead Location: 753860
Implantable Pulse Generator Implant Date: 20200814
Lead Channel Impedance Value: 387.5 Ohm
Lead Channel Impedance Value: 562.5 Ohm
Lead Channel Pacing Threshold Amplitude: 0.5 V
Lead Channel Pacing Threshold Amplitude: 0.75 V
Lead Channel Pacing Threshold Pulse Width: 0.5 ms
Lead Channel Pacing Threshold Pulse Width: 0.5 ms
Lead Channel Sensing Intrinsic Amplitude: 3.3 mV
Lead Channel Sensing Intrinsic Amplitude: 7.5 mV
Lead Channel Setting Pacing Amplitude: 2 V
Lead Channel Setting Pacing Amplitude: 2.5 V
Lead Channel Setting Pacing Pulse Width: 0.5 ms
Lead Channel Setting Sensing Sensitivity: 2 mV
Pulse Gen Model: 2272
Pulse Gen Serial Number: 9154171

## 2018-11-24 NOTE — Progress Notes (Signed)
Electrophysiology Office Note   Date:  11/24/2018   ID:  Vickie Ferguson, Vickie Ferguson 25-Feb-1955, MRN [id number]  PCP:  Nicoletta Dress, MD  Cardiologist: Agustin Cree Primary Electrophysiologist:  Azaylia Fong Meredith Leeds, MD    No chief complaint on file.    History of Present Illness: Vickie Ferguson is a 63 y.o. female who is being seen today for the evaluation of heart block at the request of Nicoletta Dress, MD. Presenting today for electrophysiology evaluation.  She has a history significant for hypertension, diabetes, OSA.  She has had multiple episodes of bradycardia with some dropped beats.  Beta-blocker has been stopped.  Her main symptoms are fatigue and weakness.  She is also noted that her blood pressure drops at times.  Today, denies symptoms of palpitations, chest pain, shortness of breath, orthopnea, PND, lower extremity edema, claudication, dizziness, presyncope, syncope, bleeding, or neurologic sequela. The patient is tolerating medications without difficulties.  Overall she is feeling well.  She is improving overall, but she was found to be quite anemic and has gotten IV iron.  She is currently undergoing physical therapy.   Past Medical History:  Diagnosis Date  . Anemia   . Anginal pain ([place])   . Arthritis   . CKD (chronic kidney disease), stage III   . Complication of anesthesia   . Diabetes mellitus without complication ([place])   . Family history of adverse reaction to anesthesia    " FAMILY MEMBERS HAD DIFFICULTY WAKING "  . Hypertension   . Hypothyroidism   . PONV (postoperative nausea and vomiting)   . Presence of permanent cardiac pacemaker 08/15/2018   St Jude Medical Assurity MRI dual-chamber pacemaker for symptomatic bradycardia  . Second degree AV block    Past Surgical History:  Procedure Laterality Date  . CARDIAC CATHETERIZATION    . INSERT / REPLACE / REMOVE PACEMAKER  08/15/2018   St Jude Medical Assurity MRI dual-chamber pacemaker for symptomatic  bradycardia  . LEFT HEART CATH AND CORONARY ANGIOGRAPHY N/A 06/17/2018   Procedure: LEFT HEART CATH AND CORONARY ANGIOGRAPHY;  Surgeon: Martinique, Peter M, MD;  Location: [place] [facility];  Service: Cardiovascular;  Laterality: N/A;  . PACEMAKER IMPLANT N/A 08/15/2018   Procedure: PACEMAKER IMPLANT;  Surgeon: Constance Haw, MD;  Location: [place] [facility];  Service: Cardiovascular;  Laterality: N/A;  . SHOULDER SURGERY Right 2012   METAL PLATE   . TOTAL KNEE ARTHROPLASTY Right 2001     Current Outpatient Medications  Medication Sig Dispense Refill  . acetaminophen (TYLENOL) 500 MG tablet Take 500 mg by mouth every 6 (six) hours as needed for mild pain or headache.    . ALPRAZolam (XANAX) 1 MG tablet Take 1 mg by mouth every 8 (eight) hours as needed for anxiety.     Marland Kitchen atorvastatin (LIPITOR) 40 MG tablet Take 40 mg by mouth at bedtime.      . clopidogrel (PLAVIX) 75 MG tablet Take 75 mg by mouth daily.     Marland Kitchen dexlansoprazole (DEXILANT) 60 MG capsule Take 60 mg by mouth daily.     . fluticasone (FLONASE) 50 MCG/ACT nasal spray Place 1 spray into both nostrils daily as needed for allergies. For allergies     . furosemide (LASIX) 40 MG tablet TAKE 1 TABLET BY MOUTH IN THE MORNING AND 1/2 TABLET IN THE EVENING (Patient taking differently: Take 20-40 mg by mouth See admin instructions. Take 40mg  in the morning and 20mg  in the evening. If gains  2 pounds in three days then take an extra $Remove'20mg'VdMMvid$  in the evening one time.) 30 tablet 6  . hydroxypropyl methylcellulose (ISOPTO TEARS) 2.5 % ophthalmic solution Place 2 drops into both eyes every 2 (two) hours as needed for dry eyes.     Marland Kitchen levothyroxine (SYNTHROID, LEVOTHROID) 75 MCG tablet Take 75 mcg by mouth daily before breakfast.     . metFORMIN (GLUCOPHAGE) 500 MG tablet Take 500 mg by mouth 2 (two) times daily with a meal.     . metoprolol tartrate (LOPRESSOR) 25 MG tablet Take 1 tablet (25 mg total) by mouth 2 (two) times daily. 180 tablet 1  .  nitroGLYCERIN (NITROSTAT) 0.4 MG SL tablet Place 1 tablet (0.4 mg total) under the tongue every 5 (five) minutes as needed. 25 tablet 6  . ondansetron (ZOFRAN) 4 MG tablet Take 4 mg by mouth 3 (three) times daily as needed for nausea.     . Oxycodone HCl 10 MG TABS Take 5-10 mg by mouth every 6 (six) hours as needed (pain).     . pioglitazone (ACTOS) 45 MG tablet Take 45 mg by mouth daily.       No current facility-administered medications for this visit.     Allergies:   Aleve [naproxen sodium], Procainamide, Rythmol [propafenone hcl], Codeine, Doxycycline, Isosorbide nitrate, and Sertraline   Social History:  The patient  reports that she has never smoked. She has never used smokeless tobacco. She reports that she does not drink alcohol or use drugs.   Family History:  [name] family history includes Heart disease in her mother.   ROS:  Please see the history of present illness.   Otherwise, review of systems is positive for none.   All other systems are reviewed and negative.   PHYSICAL EXAM: VS:  BP 138/86   Pulse 66   Ht $R'5\' 4"'TM$  (1.626 m)   Wt 290 lb 6.4 oz (131.7 kg)   SpO2 99%   BMI 49.85 kg/m  , BMI Body mass index is 49.85 kg/m. GEN: Well nourished, well developed, in no acute distress  HEENT: normal  Neck: no JVD, carotid bruits, or masses Cardiac: RRR; no murmurs, rubs, or gallops,no edema  Respiratory:  clear to auscultation bilaterally, normal work of breathing GI: soft, nontender, nondistended, + BS MS: no deformity or atrophy  Skin: warm and dry, device site well healed Neuro:  Strength and sensation are intact Psych: euthymic mood, full affect  EKG:  EKG is ordered today. Personal review of the ekg ordered shows sinus rhythm, right bundle branch block, rate 149  Personal review of the device interrogation today. Results in [facility]: 06/03/2018: ALT 5; NT-Pro BNP 577; TSH 9.100 06/17/2018: Magnesium 2.1 08/05/2018: BUN 23; Creatinine, Ser 1.56;  Hemoglobin 10.6; Platelets 241; Potassium 4.7; Sodium 139    Lipid Panel  No results found for: CHOL, TRIG, HDL, CHOLHDL, VLDL, LDLCALC, LDLDIRECT   Wt Readings from Last 3 Encounters:  11/24/18 290 lb 6.4 oz (131.7 kg)  10/29/18 285 lb (129.3 kg)  09/01/18 287 lb 9.6 oz (130.5 kg)      Other studies Reviewed: Additional studies/ records that were reviewed today include: TTE 06/10/18  Review of the above records today demonstrates:   1. The left ventricle has normal systolic function, with an ejection fraction of 55-60%. The cavity size was normal. Left ventricular diastolic function could not be evaluated.  2. The right ventricle has normal systolic function. The cavity was normal. There  is no increase in right ventricular wall thickness.  3. Mitral valve regurgitation is mild to moderate by color flow Doppler.  4. The tricuspid valve is grossly normal.  5. The aortic valve is grossly normal. Moderate sclerosis of the aortic valve. Aortic valve regurgitation is trivial by color flow Doppler.  LHC 4/47/39  LV end diastolic pressure is normal.   1. Normal coronary anatomy 2. Normal LVEDP  Cardiac monitor 07/13/2018 personally reviewed Baseline rhythm: Sinus rhythm with intraventricular conduction delay Minimum heart rate: 38 BPM.  Average heart rate: 66 BPM.  Maximal heart rate 92 BPM. Conduction abnormality: Multiple episodes of 2-1 AV conduction, as well as second-degree type II AV block.  Symptoms: Multiple symptomatology many times related to AV block.   ASSESSMENT AND PLAN:  1.  Second-degree AV block: Status post Saint Jude dual-chamber pacemaker implanted 08/15/2018.  Device functioning appropriately.  No changes.  2.  Hypertension: The elevated but usually better controlled.  No changes.    Current medicines are reviewed at length with the patient today.   The patient does not have concerns regarding her medicines.  The following changes were made today: None  Labs/  tests ordered today include:  Orders Placed This Encounter  Procedures  . EKG 12-Lead    Disposition:   FU with Emberly Tomasso 9 months  Signed, Laurann Mcmorris Meredith Leeds, MD  11/24/2018 10:35 AM     [facility] [address] [place] [address] [phone] (office) [phone] (fax)

## 2018-11-24 NOTE — Patient Instructions (Signed)
Medication Instructions:  Your physician recommends that you continue on your current medications as directed. Please refer to the Current Medication list given to you today.  *If you need a refill on your cardiac medications before your next appointment, please call your pharmacy*  Labwork: None ordered  Testing/Procedures: None ordered  Follow-Up: Remote monitoring is used to monitor your Pacemaker or ICD from home. This monitoring reduces the number of office visits required to check your device to one time per year. It allows [place] to keep an eye on the functioning of your device to ensure it is working properly. You are scheduled for a device check from home on 02/12/2019. You may send your transmission at any time that day. If you have a wireless device, the transmission will be sent automatically. After your physician reviews your transmission, you will receive a postcard with your next transmission date.  At [facility], you and your health needs are our priority.  As part of our continuing mission to provide you with exceptional heart care, we have created designated Provider Care Teams.  These Care Teams include your primary Cardiologist (physician) and Advanced Practice Providers (APPs -  Physician Assistants and Nurse Practitioners) who all work together to provide you with the care you need, when you need it.  You will need a follow up appointment in 9 months.  Please call our office 2 months in advance to schedule this appointment.  You will see Dr Curt Bears.  Thank you for choosing [facility]!!   Trinidad Curet, RN [phone]

## 2018-11-26 DIAGNOSIS — E039 Hypothyroidism, unspecified: Secondary | ICD-10-CM | POA: Diagnosis not present

## 2018-11-26 DIAGNOSIS — E1122 Type 2 diabetes mellitus with diabetic chronic kidney disease: Secondary | ICD-10-CM | POA: Diagnosis not present

## 2018-11-26 DIAGNOSIS — N183 Chronic kidney disease, stage 3 unspecified: Secondary | ICD-10-CM | POA: Diagnosis not present

## 2018-11-26 DIAGNOSIS — F419 Anxiety disorder, unspecified: Secondary | ICD-10-CM | POA: Diagnosis not present

## 2018-11-26 DIAGNOSIS — I129 Hypertensive chronic kidney disease with stage 1 through stage 4 chronic kidney disease, or unspecified chronic kidney disease: Secondary | ICD-10-CM | POA: Diagnosis not present

## 2018-11-26 DIAGNOSIS — D631 Anemia in chronic kidney disease: Secondary | ICD-10-CM | POA: Diagnosis not present

## 2018-11-28 ENCOUNTER — Other Ambulatory Visit: Payer: Self-pay | Admitting: Cardiology

## 2018-12-03 DIAGNOSIS — E1122 Type 2 diabetes mellitus with diabetic chronic kidney disease: Secondary | ICD-10-CM | POA: Diagnosis not present

## 2018-12-03 DIAGNOSIS — E039 Hypothyroidism, unspecified: Secondary | ICD-10-CM | POA: Diagnosis not present

## 2018-12-03 DIAGNOSIS — D631 Anemia in chronic kidney disease: Secondary | ICD-10-CM | POA: Diagnosis not present

## 2018-12-03 DIAGNOSIS — I129 Hypertensive chronic kidney disease with stage 1 through stage 4 chronic kidney disease, or unspecified chronic kidney disease: Secondary | ICD-10-CM | POA: Diagnosis not present

## 2018-12-03 DIAGNOSIS — N183 Chronic kidney disease, stage 3 unspecified: Secondary | ICD-10-CM | POA: Diagnosis not present

## 2018-12-03 DIAGNOSIS — F419 Anxiety disorder, unspecified: Secondary | ICD-10-CM | POA: Diagnosis not present

## 2018-12-20 ENCOUNTER — Other Ambulatory Visit: Payer: Self-pay | Admitting: Cardiology

## 2019-01-07 DIAGNOSIS — D509 Iron deficiency anemia, unspecified: Secondary | ICD-10-CM | POA: Diagnosis not present

## 2019-01-07 DIAGNOSIS — D631 Anemia in chronic kidney disease: Secondary | ICD-10-CM | POA: Diagnosis not present

## 2019-01-07 DIAGNOSIS — N189 Chronic kidney disease, unspecified: Secondary | ICD-10-CM | POA: Diagnosis not present

## 2019-01-16 ENCOUNTER — Encounter: Payer: Self-pay | Admitting: Cardiology

## 2019-01-16 ENCOUNTER — Ambulatory Visit (INDEPENDENT_AMBULATORY_CARE_PROVIDER_SITE_OTHER): Payer: Medicare Other | Admitting: Cardiology

## 2019-01-16 ENCOUNTER — Other Ambulatory Visit: Payer: Self-pay

## 2019-01-16 VITALS — BP 126/80 | HR 69 | Ht 64.0 in | Wt 293.0 lb

## 2019-01-16 DIAGNOSIS — I441 Atrioventricular block, second degree: Secondary | ICD-10-CM

## 2019-01-16 DIAGNOSIS — I472 Ventricular tachycardia, unspecified: Secondary | ICD-10-CM

## 2019-01-16 DIAGNOSIS — I10 Essential (primary) hypertension: Secondary | ICD-10-CM

## 2019-01-16 DIAGNOSIS — Z950 Presence of cardiac pacemaker: Secondary | ICD-10-CM | POA: Diagnosis not present

## 2019-01-16 DIAGNOSIS — I4729 Other ventricular tachycardia: Secondary | ICD-10-CM

## 2019-01-16 NOTE — Progress Notes (Signed)
Cardiology Office Note:    Date:  01/16/2019   ID:  Vickie Ferguson [address] DOB [birth year], MRN [id number]  PCP:  Nicoletta Dress, MD  Cardiologist:  Jenne Campus, MD    Referring MD: Nicoletta Dress, MD   No chief complaint on file.   History of Present Illness:    Vickie Ferguson is a 64 y.o. female with past medical history significant for palpitations, PVCs, nonsustained ventricular tachycardia, morbid obesity, diabetes, diastolic congestive heart failure.  She did have a cardiac catheterization done in 2020 which showed normal coronaries.  Overall she is doing fair she complained of having some palpitations from time to time.  Does have some exertional shortness of breath but no chest pain tightness squeezing pressure mentions overall she looks good.  Past Medical History:  Diagnosis Date  . Anemia   . Anginal pain ([place])   . Arthritis   . CKD (chronic kidney disease), stage III   . Complication of anesthesia   . Diabetes mellitus without complication ([place])   . Family history of adverse reaction to anesthesia    " FAMILY MEMBERS HAD DIFFICULTY WAKING "  . Hypertension   . Hypothyroidism   . PONV (postoperative nausea and vomiting)   . Presence of permanent cardiac pacemaker 08/15/2018   St Jude Medical Assurity MRI dual-chamber pacemaker for symptomatic bradycardia  . Second degree AV block     Past Surgical History:  Procedure Laterality Date  . CARDIAC CATHETERIZATION    . INSERT / REPLACE / REMOVE PACEMAKER  08/15/2018   St Jude Medical Assurity MRI dual-chamber pacemaker for symptomatic bradycardia  . LEFT HEART CATH AND CORONARY ANGIOGRAPHY N/A 06/17/2018   Procedure: LEFT HEART CATH AND CORONARY ANGIOGRAPHY;  Surgeon: Martinique, Peter M, MD;  Location: [place] [facility];  Service: Cardiovascular;  Laterality: N/A;  . PACEMAKER IMPLANT N/A 08/15/2018   Procedure: PACEMAKER IMPLANT;  Surgeon: Constance Haw, MD;  Location: [place] [facility];   Service: Cardiovascular;  Laterality: N/A;  . SHOULDER SURGERY Right 2012   METAL PLATE   . TOTAL KNEE ARTHROPLASTY Right 2001    Current Medications: Current Meds  Medication Sig  . acetaminophen (TYLENOL) 500 MG tablet Take 500 mg by mouth every 6 (six) hours as needed for mild pain or headache.  . ALPRAZolam (XANAX) 1 MG tablet Take 1 mg by mouth every 8 (eight) hours as needed for anxiety.   Marland Kitchen atorvastatin (LIPITOR) 40 MG tablet Take 40 mg by mouth at bedtime.    . clopidogrel (PLAVIX) 75 MG tablet Take 75 mg by mouth daily.   Marland Kitchen dexlansoprazole (DEXILANT) 60 MG capsule Take 60 mg by mouth daily.   . fluticasone (FLONASE) 50 MCG/ACT nasal spray Place 1 spray into both nostrils daily as needed for allergies. For allergies   . furosemide (LASIX) 40 MG tablet Take 0.5-1 tablets (20-40 mg total) by mouth See admin instructions. Take 40mg  in the morning and 20mg  in the evening. If gains 2 pounds in three days then take an extra 20mg  in the evening one time.  . hydroxypropyl methylcellulose (ISOPTO TEARS) 2.5 % ophthalmic solution Place 2 drops into both eyes every 2 (two) hours as needed for dry eyes.   Marland Kitchen levothyroxine (SYNTHROID, LEVOTHROID) 75 MCG tablet Take 75 mcg by mouth daily before breakfast.   . metFORMIN (GLUCOPHAGE) 500 MG tablet Take 500 mg by mouth 2 (two) times daily with a meal.   . metoprolol tartrate (LOPRESSOR) 25 MG  tablet TAKE 1 TABLET(25 MG) BY MOUTH TWICE DAILY  . nitroGLYCERIN (NITROSTAT) 0.4 MG SL tablet Place 1 tablet (0.4 mg total) under the tongue every 5 (five) minutes as needed.  . ondansetron (ZOFRAN) 4 MG tablet Take 4 mg by mouth 3 (three) times daily as needed for nausea.   . Oxycodone HCl 10 MG TABS Take 5-10 mg by mouth every 6 (six) hours as needed (pain).   . pioglitazone (ACTOS) 45 MG tablet Take 45 mg by mouth daily.       Allergies:   Aleve [naproxen sodium], Procainamide, Rythmol [propafenone hcl], Codeine, Doxycycline, Isosorbide nitrate, and  Sertraline   Social History   Socioeconomic History  . Marital status: Legally Separated    Spouse name: Not on file  . Number of children: Not on file  . Years of education: Not on file  . Highest education level: Not on file  Occupational History  . Not on file  Tobacco Use  . Smoking status: Never Smoker  . Smokeless tobacco: Never Used  Substance and Sexual Activity  . Alcohol use: No    Alcohol/week: 0.0 standard drinks  . Drug use: No  . Sexual activity: Not on file  Other Topics Concern  . Not on file  Social History Narrative  . Not on file   Social Determinants of Health   Financial Resource Strain:   . Difficulty of Paying Living Expenses: Not on file  Food Insecurity:   . Worried About Charity fundraiser in the Last Year: Not on file  . Ran Out of Food in the Last Year: Not on file  Transportation Needs:   . Lack of Transportation (Medical): Not on file  . Lack of Transportation (Non-Medical): Not on file  Physical Activity:   . Days of Exercise per Week: Not on file  . Minutes of Exercise per Session: Not on file  Stress:   . Feeling of Stress : Not on file  Social Connections:   . Frequency of Communication with Friends and Family: Not on file  . Frequency of Social Gatherings with Friends and Family: Not on file  . Attends Religious Services: Not on file  . Active Member of Clubs or Organizations: Not on file  . Attends Archivist Meetings: Not on file  . Marital Status: Not on file     Family History: [name] family history includes Heart disease in her mother. ROS:   Please see the history of present illness.    All 14 point review of systems negative except as described per history of present illness  EKGs/Labs/Other Studies Reviewed:      Recent Labs: 06/03/2018: ALT 5; NT-Pro BNP 577; TSH 9.100 06/17/2018: Magnesium 2.1 08/05/2018: BUN 23; Creatinine, Ser 1.56; Hemoglobin 10.6; Platelets 241; Potassium 4.7; Sodium 139    Recent Lipid Panel No results found for: CHOL, TRIG, HDL, CHOLHDL, VLDL, LDLCALC, LDLDIRECT  Physical Exam:    VS:  BP 126/80   Pulse 69   Ht $R'5\' 4"'pm$  (1.626 m)   Wt 293 lb (132.9 kg)   SpO2 99%   BMI 50.29 kg/m     Wt Readings from Last 3 Encounters:  01/16/19 293 lb (132.9 kg)  11/24/18 290 lb 6.4 oz (131.7 kg)  10/29/18 285 lb (129.3 kg)     GEN:  Well nourished, well developed in no acute distress HEENT: Normal NECK: No JVD; No carotid bruits LYMPHATICS: No lymphadenopathy CARDIAC: RRR, no murmurs, no rubs, no gallops RESPIRATORY:  Clear to auscultation without rales, wheezing or rhonchi  ABDOMEN: Soft, non-tender, non-distended MUSCULOSKELETAL:  No edema; No deformity  SKIN: Warm and dry LOWER EXTREMITIES: no swelling NEUROLOGIC:  Alert and oriented x 3 PSYCHIATRIC:  Normal affect   ASSESSMENT:    1. Essential hypertension   2. Pacemaker [place] Jude device   3. Second degree AV block   4. Ventricular tachycardia (paroxysmal) (HCC)    PLAN:    In order of problems listed above:  1. Essential hypertension blood pressure well controlled continue present management. 2. Essential device noted functioning normally.  Follow-up by our pacemaker clinic 3. History of second-degree AV block taken care of by pacemaker 4. Ventricular tachycardia denies having any.  She does have some palpitations however.  I asked her to take some extra dose of metoprolol the days that she has palpitations if her blood pressure is low ask you to drink some extra fluids.   Medication Adjustments/Labs and Tests Ordered: Current medicines are reviewed at length with the patient today.  Concerns regarding medicines are outlined above.  No orders of the defined types were placed in this encounter.  Medication changes: No orders of the defined types were placed in this encounter.   Signed, Park Liter, MD, [facility] 01/16/2019 11:10 AM    [facility]

## 2019-01-16 NOTE — Patient Instructions (Signed)
Medication Instructions:  .instuc  *If you need a refill on your cardiac medications before your next appointment, please call your pharmacy*  Lab Work: None.  If you have labs (blood work) drawn today and your tests are completely normal, you will receive your results only by: Marland Kitchen MyChart Message (if you have MyChart) OR . A paper copy in the mail If you have any lab test that is abnormal or we need to change your treatment, we will call you to review the results.  Testing/Procedures: None.  Follow-Up: At [facility], you and your health needs are our priority.  As part of our continuing mission to provide you with exceptional heart care, we have created designated Provider Care Teams.  These Care Teams include your primary Cardiologist (physician) and Advanced Practice Providers (APPs -  Physician Assistants and Nurse Practitioners) who all work together to provide you with the care you need, when you need it.  Your next appointment:   4 month(s)  The format for your next appointment:   In Person  Provider:   Jenne Campus, MD  Other Instructions

## 2019-02-03 DIAGNOSIS — Z20822 Contact with and (suspected) exposure to covid-19: Secondary | ICD-10-CM | POA: Diagnosis not present

## 2019-02-03 DIAGNOSIS — Z950 Presence of cardiac pacemaker: Secondary | ICD-10-CM | POA: Diagnosis not present

## 2019-02-03 DIAGNOSIS — E8779 Other fluid overload: Secondary | ICD-10-CM | POA: Diagnosis not present

## 2019-02-03 DIAGNOSIS — Z8673 Personal history of transient ischemic attack (TIA), and cerebral infarction without residual deficits: Secondary | ICD-10-CM | POA: Diagnosis not present

## 2019-02-03 DIAGNOSIS — I10 Essential (primary) hypertension: Secondary | ICD-10-CM | POA: Diagnosis not present

## 2019-02-03 DIAGNOSIS — R0981 Nasal congestion: Secondary | ICD-10-CM | POA: Diagnosis not present

## 2019-02-03 DIAGNOSIS — R069 Unspecified abnormalities of breathing: Secondary | ICD-10-CM | POA: Diagnosis not present

## 2019-02-03 DIAGNOSIS — R531 Weakness: Secondary | ICD-10-CM | POA: Diagnosis not present

## 2019-02-03 DIAGNOSIS — E039 Hypothyroidism, unspecified: Secondary | ICD-10-CM | POA: Diagnosis not present

## 2019-02-03 DIAGNOSIS — R05 Cough: Secondary | ICD-10-CM | POA: Diagnosis not present

## 2019-02-03 DIAGNOSIS — E119 Type 2 diabetes mellitus without complications: Secondary | ICD-10-CM | POA: Diagnosis not present

## 2019-02-03 DIAGNOSIS — R002 Palpitations: Secondary | ICD-10-CM | POA: Diagnosis not present

## 2019-02-03 DIAGNOSIS — K219 Gastro-esophageal reflux disease without esophagitis: Secondary | ICD-10-CM | POA: Diagnosis not present

## 2019-02-03 DIAGNOSIS — M1990 Unspecified osteoarthritis, unspecified site: Secondary | ICD-10-CM | POA: Diagnosis not present

## 2019-02-03 DIAGNOSIS — J302 Other seasonal allergic rhinitis: Secondary | ICD-10-CM | POA: Diagnosis not present

## 2019-02-10 DIAGNOSIS — I10 Essential (primary) hypertension: Secondary | ICD-10-CM | POA: Diagnosis not present

## 2019-02-10 DIAGNOSIS — Z6841 Body Mass Index (BMI) 40.0 and over, adult: Secondary | ICD-10-CM | POA: Diagnosis not present

## 2019-02-10 DIAGNOSIS — R0600 Dyspnea, unspecified: Secondary | ICD-10-CM | POA: Diagnosis not present

## 2019-02-10 DIAGNOSIS — R7989 Other specified abnormal findings of blood chemistry: Secondary | ICD-10-CM | POA: Diagnosis not present

## 2019-02-12 ENCOUNTER — Ambulatory Visit (INDEPENDENT_AMBULATORY_CARE_PROVIDER_SITE_OTHER): Payer: Medicare Other | Admitting: *Deleted

## 2019-02-12 DIAGNOSIS — I441 Atrioventricular block, second degree: Secondary | ICD-10-CM

## 2019-02-12 LAB — CUP PACEART REMOTE DEVICE CHECK
Battery Remaining Longevity: 116 mo
Battery Remaining Percentage: 95.5 %
Battery Voltage: 3.04 V
Brady Statistic AP VP Percent: 1 %
Brady Statistic AP VS Percent: 8.1 %
Brady Statistic AS VP Percent: 11 %
Brady Statistic AS VS Percent: 81 %
Brady Statistic RA Percent Paced: 7.9 %
Brady Statistic RV Percent Paced: 11 %
Date Time Interrogation Session: 20210211020014
Implantable Lead Implant Date: 20200814
Implantable Lead Implant Date: 20200814
Implantable Lead Location: 753859
Implantable Lead Location: 753860
Implantable Pulse Generator Implant Date: 20200814
Lead Channel Impedance Value: 360 Ohm
Lead Channel Impedance Value: 490 Ohm
Lead Channel Pacing Threshold Amplitude: 0.5 V
Lead Channel Pacing Threshold Amplitude: 0.75 V
Lead Channel Pacing Threshold Pulse Width: 0.5 ms
Lead Channel Pacing Threshold Pulse Width: 0.5 ms
Lead Channel Sensing Intrinsic Amplitude: 1.4 mV
Lead Channel Sensing Intrinsic Amplitude: 6.6 mV
Lead Channel Setting Pacing Amplitude: 2 V
Lead Channel Setting Pacing Amplitude: 2.5 V
Lead Channel Setting Pacing Pulse Width: 0.5 ms
Lead Channel Setting Sensing Sensitivity: 2 mV
Pulse Gen Model: 2272
Pulse Gen Serial Number: 9154171

## 2019-02-12 NOTE — Progress Notes (Signed)
PPM Remote  

## 2019-02-20 ENCOUNTER — Other Ambulatory Visit: Payer: Self-pay | Admitting: Sports Medicine

## 2019-02-20 MED ORDER — CEPHALEXIN 500 MG PO CAPS: 500.0000 mg | ORAL_CAPSULE | Freq: Four times a day (QID) | ORAL | 0 refills | Status: DC

## 2019-02-20 NOTE — Progress Notes (Signed)
Rx Keflex for redness at ingrown until we can get her in office to be seen and advised her to keep soaking and if worsens over the weekend to go to urgent care

## 2019-02-25 DIAGNOSIS — E785 Hyperlipidemia, unspecified: Secondary | ICD-10-CM | POA: Diagnosis not present

## 2019-02-25 DIAGNOSIS — E669 Obesity, unspecified: Secondary | ICD-10-CM | POA: Diagnosis not present

## 2019-02-25 DIAGNOSIS — Z6841 Body Mass Index (BMI) 40.0 and over, adult: Secondary | ICD-10-CM | POA: Diagnosis not present

## 2019-02-25 DIAGNOSIS — Z1331 Encounter for screening for depression: Secondary | ICD-10-CM | POA: Diagnosis not present

## 2019-02-25 DIAGNOSIS — Z136 Encounter for screening for cardiovascular disorders: Secondary | ICD-10-CM | POA: Diagnosis not present

## 2019-02-25 DIAGNOSIS — Z1231 Encounter for screening mammogram for malignant neoplasm of breast: Secondary | ICD-10-CM | POA: Diagnosis not present

## 2019-02-25 DIAGNOSIS — Z9181 History of falling: Secondary | ICD-10-CM | POA: Diagnosis not present

## 2019-02-25 DIAGNOSIS — Z0000 Encounter for general adult medical examination without abnormal findings: Secondary | ICD-10-CM | POA: Diagnosis not present

## 2019-02-27 DIAGNOSIS — L03115 Cellulitis of right lower limb: Secondary | ICD-10-CM | POA: Diagnosis not present

## 2019-02-27 DIAGNOSIS — Z6841 Body Mass Index (BMI) 40.0 and over, adult: Secondary | ICD-10-CM | POA: Diagnosis not present

## 2019-03-04 ENCOUNTER — Encounter: Payer: Self-pay | Admitting: Sports Medicine

## 2019-03-04 ENCOUNTER — Other Ambulatory Visit: Payer: Self-pay

## 2019-03-04 ENCOUNTER — Ambulatory Visit (INDEPENDENT_AMBULATORY_CARE_PROVIDER_SITE_OTHER): Payer: Medicare Other | Admitting: Sports Medicine

## 2019-03-04 DIAGNOSIS — Z79899 Other long term (current) drug therapy: Secondary | ICD-10-CM

## 2019-03-04 DIAGNOSIS — E119 Type 2 diabetes mellitus without complications: Secondary | ICD-10-CM

## 2019-03-04 DIAGNOSIS — M79674 Pain in right toe(s): Secondary | ICD-10-CM

## 2019-03-04 DIAGNOSIS — L600 Ingrowing nail: Secondary | ICD-10-CM | POA: Diagnosis not present

## 2019-03-04 DIAGNOSIS — Z794 Long term (current) use of insulin: Secondary | ICD-10-CM | POA: Diagnosis not present

## 2019-03-04 MED ORDER — NEOMYCIN-POLYMYXIN-HC 3.5-10000-1 OT SOLN: OTIC | 0 refills | Status: DC

## 2019-03-04 NOTE — Patient Instructions (Signed)

## 2019-03-04 NOTE — Progress Notes (Signed)
Subjective: Vickie Ferguson is a 64 y.o. female patient presents to office today complaining of a moderately painful incurvated, red, hot, swollen medial nail border of the first toe on the right foot. This has been present for 6 weeks call on last week and was started on antibiotics and reports that since she started taking the antibiotics and also getting antibiotic shot by her PCP the toe is looking a little bit better still a little tender on the medial edge. Patient has treated this by soaking with Epson salt and taking antibiotics with some improvement but there is still some tenderness. Patient denies fever/chills/nausea/vomitting/any other related constitutional symptoms at this time.  Patient is diabetic and is on blood thinners of Plavix  Fasting blood sugar this morning 155 and last A1c around 5.6.  Patient Active Problem List   Diagnosis Date Noted  . Pacemaker [place] device 09/01/2018  . Second degree AV block 08/15/2018  . Pressure injury of skin 06/17/2018  . Unstable angina ([place]) 06/17/2018  . OSA (obstructive sleep apnea) 04/26/2017  . Shortness of breath 04/26/2017  . TIA (transient ischemic attack) 04/26/2017  . Long term current use of insulin ([place]) 01/07/2017  . Other long term (current) drug therapy 01/07/2017  . Essential hypertension 07/23/2014  . Iron deficiency anemia 07/23/2014  . Precordial pain 07/23/2014  . Type 2 diabetes mellitus without complication ([place]) 83/25/4982  . Ventricular tachycardia (paroxysmal) ([place]) 07/23/2014  . Morbid (severe) obesity due to excess calories ([place]) 07/23/2014    Current Outpatient Medications on File Prior to Visit  Medication Sig Dispense Refill  . acetaminophen (TYLENOL) 500 MG tablet Take 500 mg by mouth every 6 (six) hours as needed for mild pain or headache.    Marland Kitchen atorvastatin (LIPITOR) 40 MG tablet Take 40 mg by mouth at bedtime.      . clopidogrel (PLAVIX) 75 MG tablet Take 75 mg by mouth daily.     Marland Kitchen  dexlansoprazole (DEXILANT) 60 MG capsule Take 60 mg by mouth daily.     . fluticasone (FLONASE) 50 MCG/ACT nasal spray Place 1 spray into both nostrils daily as needed for allergies. For allergies     . furosemide (LASIX) 40 MG tablet Take 0.5-1 tablets (20-40 mg total) by mouth See admin instructions. Take 40mg  in the morning and 20mg  in the evening. If gains 2 pounds in three days then take an extra 20mg  in the evening one time. 90 tablet 3  . levothyroxine (SYNTHROID, LEVOTHROID) 75 MCG tablet Take 75 mcg by mouth daily before breakfast.     . metFORMIN (GLUCOPHAGE) 500 MG tablet Take 500 mg by mouth 2 (two) times daily with a meal.     . metoprolol tartrate (LOPRESSOR) 25 MG tablet TAKE 1 TABLET(25 MG) BY MOUTH TWICE DAILY 180 tablet 1  . nitroGLYCERIN (NITROSTAT) 0.4 MG SL tablet Place 1 tablet (0.4 mg total) under the tongue every 5 (five) minutes as needed. 25 tablet 6  . Oxycodone HCl 10 MG TABS Take 5-10 mg by mouth every 6 (six) hours as needed (pain).      No current facility-administered medications on file prior to visit.    Allergies  Allergen Reactions  . Aleve [Naproxen Sodium] Anaphylaxis and Swelling    Facial and hand swelling  . Procainamide Anaphylaxis and Swelling    Facial swelling  . Rythmol [Propafenone Hcl] Other (See Comments)    Bradycardia  . Codeine Nausea And Vomiting    Shakiness  . Doxycycline Other (  See Comments)    Heart rythym issues  . Isosorbide Nitrate Other (See Comments)    Headaches  . Sertraline Hives and Rash    Large blisters. "Looked like I'd been burned".    Objective:  There were no vitals filed for this visit.  General: Well developed, nourished, in no acute distress, alert and oriented x3   Dermatology: Skin is warm, dry and supple bilateral.  Right hallux nail appears to be  severely incurvated with hyperkeratosis formation at the distal aspects of  the medial nail border. (+) Erythema. (+) Edema. (-) serosanguous  drainage  present. The remaining nails appear unremarkable at this time. There are no open sores, lesions or other signs of infection  present.  Vascular: Dorsalis Pedis artery and Posterior Tibial artery pedal pulses are 1/4 bilateral with immedate capillary fill time.  Scant pedal hair growth present.  Varicosities present bilateral.  Trace lower extremity edema noted bilateral.   Neruologic: Grossly intact via light touch bilateral.  Musculoskeletal: Tenderness to palpation of the right hallux medial nail fold. Muscular strength within normal limits in all groups bilateral.   Assesement and Plan: Problem List Items Addressed This Visit      Other   Long term current use of insulin ([place])   Other long term (current) drug therapy    Other Visit Diagnoses    Ingrown nail    -  Primary   Toe pain, right       Diabetes mellitus without complication ([place])          -Discussed treatment alternatives and plan of care; Explained permanent/temporary nail avulsion and post procedure course to patient. Patient elects for PNA right hallux medial nail border - After a verbal and written consent, injected 3 ml of a 50:50 mixture of 2% plain  lidocaine and 0.5% plain marcaine in a normal hallux block fashion. Next, a  betadine prep was performed. Anesthesia was tested and found to be appropriate.  The offending right hallux medial nail border nail border was then incised from the hyponychium to the epinychium. The offending nail border was removed and cleared from the field. The area was curretted for any remaining nail or spicules. Phenol application performed and the area was then flushed with alcohol and dressed with antibiotic cream and a dry sterile dressing. -Patient was instructed to leave the dressing intact for today and begin soaking  in a weak solution of betadine or Epsom salt and water tomorrow. Patient was instructed to  soak for 15-20 minutes each day and apply neosporin/corticosporin and a gauze  or bandaid dressing each day. -No additional antibiotics needed at this time since patient just recently completed a course of oral antibiotics -Patient was instructed to monitor the toe for signs of infection and return to office if toe becomes red, hot or swollen. -Advised ice, elevation, and tylenol or motrin if needed for pain.  -Patient is to return in 2 weeks for follow up care/nail check or sooner if problems arise.  Landis Martins, DPM

## 2019-03-19 ENCOUNTER — Encounter: Payer: Self-pay | Admitting: Sports Medicine

## 2019-03-19 ENCOUNTER — Ambulatory Visit (INDEPENDENT_AMBULATORY_CARE_PROVIDER_SITE_OTHER): Payer: Medicare Other | Admitting: Sports Medicine

## 2019-03-19 ENCOUNTER — Other Ambulatory Visit: Payer: Self-pay

## 2019-03-19 DIAGNOSIS — M79674 Pain in right toe(s): Secondary | ICD-10-CM

## 2019-03-19 DIAGNOSIS — Z9889 Other specified postprocedural states: Secondary | ICD-10-CM

## 2019-03-19 DIAGNOSIS — E119 Type 2 diabetes mellitus without complications: Secondary | ICD-10-CM

## 2019-03-19 NOTE — Progress Notes (Addendum)
Subjective: Jakerra Floyd Duty Sukhu is a 64 y.o. female patient returns to office today for follow up evaluation after having Right hallux medial permanent nail avulsion performed on (03-04-19). Patient has been soaking using epsom salt and applying topical antibiotic covered with bandaid daily. Was doing good until granddaughter stepped on it. Patient denies fever/chills/nausea/vomitting/any other related constitutional symptoms at this time.  FBS 139  Patient Active Problem List   Diagnosis Date Noted  . Pacemaker [place] device 09/01/2018  . Second degree AV block 08/15/2018  . Pressure injury of skin 06/17/2018  . Unstable angina ([place]) 06/17/2018  . OSA (obstructive sleep apnea) 04/26/2017  . Shortness of breath 04/26/2017  . TIA (transient ischemic attack) 04/26/2017  . Long term current use of insulin ([place]) 01/07/2017  . Other long term (current) drug therapy 01/07/2017  . Essential hypertension 07/23/2014  . Iron deficiency anemia 07/23/2014  . Precordial pain 07/23/2014  . Type 2 diabetes mellitus without complication ([place]) 97/41/6384  . Ventricular tachycardia (paroxysmal) ([place]) 07/23/2014  . Morbid (severe) obesity due to excess calories ([place]) 07/23/2014    Current Outpatient Medications on File Prior to Visit  Medication Sig Dispense Refill  . acetaminophen (TYLENOL) 500 MG tablet Take 500 mg by mouth every 6 (six) hours as needed for mild pain or headache.    Marland Kitchen atorvastatin (LIPITOR) 40 MG tablet Take 40 mg by mouth at bedtime.      . clopidogrel (PLAVIX) 75 MG tablet Take 75 mg by mouth daily.     Marland Kitchen dexlansoprazole (DEXILANT) 60 MG capsule Take 60 mg by mouth daily.     . fluticasone (FLONASE) 50 MCG/ACT nasal spray Place 1 spray into both nostrils daily as needed for allergies. For allergies     . furosemide (LASIX) 40 MG tablet Take 0.5-1 tablets (20-40 mg total) by mouth See admin instructions. Take 40mg  in the morning and 20mg  in the evening. If gains 2 pounds in three  days then take an extra 20mg  in the evening one time. 90 tablet 3  . levothyroxine (SYNTHROID, LEVOTHROID) 75 MCG tablet Take 75 mcg by mouth daily before breakfast.     . metFORMIN (GLUCOPHAGE) 500 MG tablet Take 500 mg by mouth 2 (two) times daily with a meal.     . metoprolol tartrate (LOPRESSOR) 25 MG tablet TAKE 1 TABLET(25 MG) BY MOUTH TWICE DAILY 180 tablet 1  . neomycin-polymyxin-hydrocortisone (CORTISPORIN) OTIC solution Apply 2-3 drops to the ingrown toenail site twice daily. Cover with band-aid. 10 mL 0  . nitroGLYCERIN (NITROSTAT) 0.4 MG SL tablet Place 1 tablet (0.4 mg total) under the tongue every 5 (five) minutes as needed. 25 tablet 6  . Oxycodone HCl 10 MG TABS Take 5-10 mg by mouth every 6 (six) hours as needed (pain).     . pioglitazone (ACTOS) 45 MG tablet Take 45 mg by mouth daily.     No current facility-administered medications on file prior to visit.    Allergies  Allergen Reactions  . Aleve [Naproxen Sodium] Anaphylaxis and Swelling    Facial and hand swelling  . Procainamide Anaphylaxis and Swelling    Facial swelling  . Rythmol [Propafenone Hcl] Other (See Comments)    Bradycardia  . Codeine Nausea And Vomiting    Shakiness  . Doxycycline Other (See Comments)    Heart rythym issues  . Isosorbide Nitrate Other (See Comments)    Headaches  . Sertraline Hives and Rash    Large blisters. "Looked like I'd been burned".  Objective:  General: Well developed, nourished, in no acute distress, alert and oriented x3   Dermatology: Skin is warm, dry and supple bilateral. Right hallux medial nail bed appears to be clean, dry, with mild granular tissue and surrounding eschar/scab. (+) Faint focal Erythema. (-) Edema. (-) serosanguous drainage present. The remaining nails appear unremarkable at this time. There are no other lesions or other signs of infection  present.  Neurovascular status: Intact. No lower extremity swelling; No pain with calf compression  bilateral.  Musculoskeletal: Decreased tenderness to palpation of the Right hallux medial nail fold.  Muscular strength within normal limits bilateral.   Assesement and Plan: Problem List Items Addressed This Visit    None    Visit Diagnoses    S/P nail surgery    -  Primary   Toe pain, right       Diabetes mellitus without complication (HCC)       Relevant Medications   pioglitazone (ACTOS) 45 MG tablet     -Examined patient  -Cleansed right hallux medial nail fold and gently scrubbed with peroxide and q-tip/curetted away eschar at site and applied antibiotic cream covered with bandaid.  -Discussed plan of care with patient. -Patient may d/c soaking and may cover with bandaid during the day. May leave open to air at night. -Educated patient on long term care after nail surgery. -Patient was instructed to monitor the toe for reoccurrence and signs of infection; Patient advised to return to office or go to ER if toe becomes red, hot or swollen. -Patient is to return as scheduled for diabetic foot care or sooner if problems arise.  Landis Martins, DPM

## 2019-03-30 DIAGNOSIS — E785 Hyperlipidemia, unspecified: Secondary | ICD-10-CM | POA: Diagnosis not present

## 2019-03-30 DIAGNOSIS — I10 Essential (primary) hypertension: Secondary | ICD-10-CM | POA: Diagnosis not present

## 2019-03-30 DIAGNOSIS — E039 Hypothyroidism, unspecified: Secondary | ICD-10-CM | POA: Diagnosis not present

## 2019-03-30 DIAGNOSIS — K219 Gastro-esophageal reflux disease without esophagitis: Secondary | ICD-10-CM | POA: Diagnosis not present

## 2019-03-30 DIAGNOSIS — E1122 Type 2 diabetes mellitus with diabetic chronic kidney disease: Secondary | ICD-10-CM | POA: Diagnosis not present

## 2019-03-30 DIAGNOSIS — N183 Chronic kidney disease, stage 3 unspecified: Secondary | ICD-10-CM | POA: Diagnosis not present

## 2019-04-14 DIAGNOSIS — Z1231 Encounter for screening mammogram for malignant neoplasm of breast: Secondary | ICD-10-CM | POA: Diagnosis not present

## 2019-05-14 ENCOUNTER — Ambulatory Visit (INDEPENDENT_AMBULATORY_CARE_PROVIDER_SITE_OTHER): Payer: Medicare Other | Admitting: *Deleted

## 2019-05-14 DIAGNOSIS — I4729 Other ventricular tachycardia: Secondary | ICD-10-CM

## 2019-05-14 DIAGNOSIS — I441 Atrioventricular block, second degree: Secondary | ICD-10-CM

## 2019-05-14 LAB — CUP PACEART REMOTE DEVICE CHECK
Battery Remaining Longevity: 117 mo
Battery Remaining Percentage: 95.5 %
Battery Voltage: 3.02 V
Brady Statistic AP VP Percent: 1 %
Brady Statistic AP VS Percent: 8.6 %
Brady Statistic AS VP Percent: 9.5 %
Brady Statistic AS VS Percent: 82 %
Brady Statistic RA Percent Paced: 8.5 %
Brady Statistic RV Percent Paced: 9.6 %
Date Time Interrogation Session: 20210513020013
Implantable Lead Implant Date: 20200814
Implantable Lead Implant Date: 20200814
Implantable Lead Location: 753859
Implantable Lead Location: 753860
Implantable Pulse Generator Implant Date: 20200814
Lead Channel Impedance Value: 400 Ohm
Lead Channel Impedance Value: 540 Ohm
Lead Channel Pacing Threshold Amplitude: 0.5 V
Lead Channel Pacing Threshold Amplitude: 0.75 V
Lead Channel Pacing Threshold Pulse Width: 0.5 ms
Lead Channel Pacing Threshold Pulse Width: 0.5 ms
Lead Channel Sensing Intrinsic Amplitude: 1.6 mV
Lead Channel Sensing Intrinsic Amplitude: 7.8 mV
Lead Channel Setting Pacing Amplitude: 2 V
Lead Channel Setting Pacing Amplitude: 2.5 V
Lead Channel Setting Pacing Pulse Width: 0.5 ms
Lead Channel Setting Sensing Sensitivity: 2 mV
Pulse Gen Model: 2272
Pulse Gen Serial Number: 9154171

## 2019-05-15 NOTE — Progress Notes (Signed)
Remote pacemaker transmission.   

## 2019-05-28 DIAGNOSIS — D649 Anemia, unspecified: Secondary | ICD-10-CM

## 2019-05-28 DIAGNOSIS — D509 Iron deficiency anemia, unspecified: Secondary | ICD-10-CM | POA: Diagnosis not present

## 2019-05-28 DIAGNOSIS — N189 Chronic kidney disease, unspecified: Secondary | ICD-10-CM | POA: Diagnosis not present

## 2019-05-28 DIAGNOSIS — D631 Anemia in chronic kidney disease: Secondary | ICD-10-CM | POA: Diagnosis not present

## 2019-06-18 ENCOUNTER — Ambulatory Visit (INDEPENDENT_AMBULATORY_CARE_PROVIDER_SITE_OTHER): Payer: Medicare Other | Admitting: Cardiology

## 2019-06-18 ENCOUNTER — Other Ambulatory Visit: Payer: Self-pay

## 2019-06-18 ENCOUNTER — Ambulatory Visit: Payer: Medicare Other | Admitting: Podiatry

## 2019-06-18 ENCOUNTER — Encounter: Payer: Self-pay | Admitting: Cardiology

## 2019-06-18 VITALS — BP 130/80 | HR 71 | Ht 64.0 in | Wt 291.0 lb

## 2019-06-18 DIAGNOSIS — I10 Essential (primary) hypertension: Secondary | ICD-10-CM | POA: Diagnosis not present

## 2019-06-18 DIAGNOSIS — I509 Heart failure, unspecified: Secondary | ICD-10-CM | POA: Insufficient documentation

## 2019-06-18 DIAGNOSIS — I472 Ventricular tachycardia, unspecified: Secondary | ICD-10-CM

## 2019-06-18 DIAGNOSIS — I5032 Chronic diastolic (congestive) heart failure: Secondary | ICD-10-CM

## 2019-06-18 DIAGNOSIS — I4729 Other ventricular tachycardia: Secondary | ICD-10-CM

## 2019-06-18 DIAGNOSIS — Z950 Presence of cardiac pacemaker: Secondary | ICD-10-CM | POA: Diagnosis not present

## 2019-06-18 DIAGNOSIS — I441 Atrioventricular block, second degree: Secondary | ICD-10-CM | POA: Diagnosis not present

## 2019-06-18 HISTORY — DX: Heart failure, unspecified: I50.9

## 2019-06-18 LAB — BASIC METABOLIC PANEL
BUN/Creatinine Ratio: 15 (ref 12–28)
BUN: 22 mg/dL (ref 8–27)
CO2: 24 mmol/L (ref 20–29)
Calcium: 9.2 mg/dL (ref 8.7–10.3)
Chloride: 98 mmol/L (ref 96–106)
Creatinine, Ser: 1.45 mg/dL — ABNORMAL HIGH (ref 0.57–1.00)
GFR calc Af Amer: 44 mL/min/{1.73_m2} — ABNORMAL LOW (ref >59)
GFR calc non Af Amer: 38 mL/min/{1.73_m2} — ABNORMAL LOW (ref >59)
Glucose: 136 mg/dL — ABNORMAL HIGH (ref 65–99)
Potassium: 5 mmol/L (ref 3.5–5.2)
Sodium: 136 mmol/L (ref 134–144)

## 2019-06-18 LAB — TSH: TSH: 7.95 u[IU]/mL — ABNORMAL HIGH (ref 0.450–4.500)

## 2019-06-18 NOTE — Patient Instructions (Signed)
Medication Instructions:  Your physician recommends that you continue on your current medications as directed. Please refer to the Current Medication list given to you today.  *If you need a refill on your cardiac medications before your next appointment, please call your pharmacy*   Lab Work: Your physician recommends that you return for lab work today:  If you have labs (blood work) drawn today and your tests are completely normal, you will receive your results only by: Marland Kitchen MyChart Message (if you have MyChart) OR . A paper copy in the mail If you have any lab test that is abnormal or we need to change your treatment, we will call you to review the results.   Testing/Procedures: None.    Follow-Up: At [facility], you and your health needs are our priority.  As part of our continuing mission to provide you with exceptional heart care, we have created designated Provider Care Teams.  These Care Teams include your primary Cardiologist (physician) and Advanced Practice Providers (APPs -  Physician Assistants and Nurse Practitioners) who all work together to provide you with the care you need, when you need it.  We recommend signing up for the patient portal called "MyChart".  Sign up information is provided on this After Visit Summary.  MyChart is used to connect with patients for Virtual Visits (Telemedicine).  Patients are able to view lab/test results, encounter notes, upcoming appointments, etc.  Non-urgent messages can be sent to your provider as well.   To learn more about what you can do with MyChart, go to [email or web address].    Your next appointment:   5 month(s)  The format for your next appointment:   In Person  Provider:   Jenne Campus, MD   Other Instructions

## 2019-06-18 NOTE — Progress Notes (Signed)
Cardiology Office Note:    Date:  06/18/2019   ID:  Vickie Ferguson [address] DOB 07-Dec-1955, MRN [id number]  PCP:  Lowella Dandy, NP  Cardiologist:  Jenne Campus, MD    Referring MD: Nicoletta Dress, MD   Chief Complaint  Patient presents with  . Follow-up  Doing well  History of Present Illness:    Vickie Ferguson is a 64 y.o. female with past medical history significant for diastolic congestive heart failure, second-degree AV block, status post dual-chamber pacemaker which is [facility] Jude device, essential hypertension, morbid obesity, PVCs.  She comes today 2 months of follow-up she is doing the same she noticed her weight fluctuating up and down 5 pounds but more or less stays stable.  She regulate the amount of furosemide what she takes based on results of her weight measurements.  Described to have some palpitation but does not bother her too much.  Still does have some exertional shortness of breath but nothing unusual.  She want me to check her kidney function as well as thyroid which I think is excellent idea she is always will be hypothyroid however anytime we try to increase the dose of thyroid medication she started getting more palpitations, therefore, she does not want to increase the dose of Synthroid unless is absolutely necessary.  Past Medical History:  Diagnosis Date  . Anemia   . Anginal pain ([place])   . Arthritis   . CKD (chronic kidney disease), stage III   . Complication of anesthesia   . Diabetes mellitus without complication ([place])   . Family history of adverse reaction to anesthesia    " FAMILY MEMBERS HAD DIFFICULTY WAKING "  . Hypertension   . Hypothyroidism   . PONV (postoperative nausea and vomiting)   . Presence of permanent cardiac pacemaker 08/15/2018   St Jude Medical Assurity MRI dual-chamber pacemaker for symptomatic bradycardia  . Second degree AV block     Past Surgical History:  Procedure Laterality Date  . CARDIAC CATHETERIZATION      . INSERT / REPLACE / REMOVE PACEMAKER  08/15/2018   St Jude Medical Assurity MRI dual-chamber pacemaker for symptomatic bradycardia  . LEFT HEART CATH AND CORONARY ANGIOGRAPHY N/A 06/17/2018   Procedure: LEFT HEART CATH AND CORONARY ANGIOGRAPHY;  Surgeon: Martinique, Peter M, MD;  Location: [place] [facility];  Service: Cardiovascular;  Laterality: N/A;  . PACEMAKER IMPLANT N/A 08/15/2018   Procedure: PACEMAKER IMPLANT;  Surgeon: Constance Haw, MD;  Location: [place] [facility];  Service: Cardiovascular;  Laterality: N/A;  . SHOULDER SURGERY Right 2012   METAL PLATE   . TOTAL KNEE ARTHROPLASTY Right 2001    Current Medications: Current Meds  Medication Sig  . acetaminophen (TYLENOL) 500 MG tablet Take 500 mg by mouth every 6 (six) hours as needed for mild pain or headache.  Marland Kitchen atorvastatin (LIPITOR) 40 MG tablet Take 40 mg by mouth at bedtime.    . clopidogrel (PLAVIX) 75 MG tablet Take 75 mg by mouth daily.   Marland Kitchen dexlansoprazole (DEXILANT) 60 MG capsule Take 60 mg by mouth daily.   . fluticasone (FLONASE) 50 MCG/ACT nasal spray Place 1 spray into both nostrils daily as needed for allergies. For allergies   . furosemide (LASIX) 40 MG tablet Take 0.5-1 tablets (20-40 mg total) by mouth See admin instructions. Take 40mg  in the morning and 20mg  in the evening. If gains 2 pounds in three days then take an extra 20mg  in the evening  one time.  Marland Kitchen levothyroxine (SYNTHROID, LEVOTHROID) 75 MCG tablet Take 75 mcg by mouth daily before breakfast.   . metFORMIN (GLUCOPHAGE) 500 MG tablet Take 500 mg by mouth 2 (two) times daily with a meal.   . metoprolol tartrate (LOPRESSOR) 25 MG tablet TAKE 1 TABLET(25 MG) BY MOUTH TWICE DAILY  . neomycin-polymyxin-hydrocortisone (CORTISPORIN) OTIC solution Apply 2-3 drops to the ingrown toenail site twice daily. Cover with band-aid.  Marland Kitchen nitroGLYCERIN (NITROSTAT) 0.4 MG SL tablet Place 1 tablet (0.4 mg total) under the tongue every 5 (five) minutes as needed.      Allergies:   Aleve [naproxen sodium], Procainamide, Rythmol [propafenone hcl], Codeine, Doxycycline, Isosorbide nitrate, and Sertraline   Social History   Socioeconomic History  . Marital status: Legally Separated    Spouse name: Not on file  . Number of children: Not on file  . Years of education: Not on file  . Highest education level: Not on file  Occupational History  . Not on file  Tobacco Use  . Smoking status: Never Smoker  . Smokeless tobacco: Never Used  Vaping Use  . Vaping Use: Never used  Substance and Sexual Activity  . Alcohol use: No    Alcohol/week: 0.0 standard drinks  . Drug use: No  . Sexual activity: Not on file  Other Topics Concern  . Not on file  Social History Narrative  . Not on file   Social Determinants of Health   Financial Resource Strain:   . Difficulty of Paying Living Expenses:   Food Insecurity:   . Worried About Charity fundraiser in the Last Year:   . Arboriculturist in the Last Year:   Transportation Needs:   . Film/video editor (Medical):   Marland Kitchen Lack of Transportation (Non-Medical):   Physical Activity:   . Days of Exercise per Week:   . Minutes of Exercise per Session:   Stress:   . Feeling of Stress :   Social Connections:   . Frequency of Communication with Friends and Family:   . Frequency of Social Gatherings with Friends and Family:   . Attends Religious Services:   . Active Member of Clubs or Organizations:   . Attends Archivist Meetings:   Marland Kitchen Marital Status:      Family History: [name] family history includes Heart disease in her mother. ROS:   Please see the history of present illness.    All 14 point review of systems negative except as described per history of present illness  EKGs/Labs/Other Studies Reviewed:      Recent Labs: 08/05/2018: BUN 23; Creatinine, Ser 1.56; Hemoglobin 10.6; Platelets 241; Potassium 4.7; Sodium 139  Recent Lipid Panel No results found for: CHOL, TRIG, HDL,  CHOLHDL, VLDL, LDLCALC, LDLDIRECT  Physical Exam:    VS:  BP 130/80 (BP Location: Left Arm, Patient Position: Standing, Cuff Size: Normal)   Pulse 71   Ht $R'5\' 4"'IA$  (1.626 m)   Wt 291 lb (132 kg)   SpO2 98%   BMI 49.95 kg/m     Wt Readings from Last 3 Encounters:  06/18/19 291 lb (132 kg)  01/16/19 293 lb (132.9 kg)  11/24/18 290 lb 6.4 oz (131.7 kg)     GEN:  Well nourished, well developed in no acute distress HEENT: Normal NECK: No JVD; No carotid bruits LYMPHATICS: No lymphadenopathy CARDIAC: RRR, no murmurs, no rubs, no gallops RESPIRATORY:  Clear to auscultation without rales, wheezing or rhonchi  ABDOMEN: Soft,  non-tender, non-distended MUSCULOSKELETAL:  No edema; No deformity  SKIN: Warm and dry LOWER EXTREMITIES: no swelling NEUROLOGIC:  Alert and oriented x 3 PSYCHIATRIC:  Normal affect   ASSESSMENT:    1. Essential hypertension   2. Morbid (severe) obesity due to excess calories ([place])   3. Chronic diastolic congestive heart failure ([place])   4. Ventricular tachycardia (paroxysmal) ([place])   5. Second degree AV block   6. Pacemaker [employer] Jude device    PLAN:    In order of problems listed above:  1. Essential hypertension blood pressure well controlled we will continue present management. 2. Morbid obesity still a problem her weight is stable however she lost quite significant amount of weight over the years.  I congratulated her for it. 3. Chronic diastolic congestive heart failure.  Appears to be compensated she regulates her weight by adjusting a diuretic.  I will check Chem-7 today the purpose will be to make sure her kidney function is fine as well as potassium. 4. Hypothyroidism: I will recheck her TSH today. 5. Pacemaker present: I did review interrogation.  She does have a output device, and normal battery status normal function.  No critical arrhythmia identified. 6. Second-degree AV block addressed with a pacemaker.   Medication Adjustments/Labs and  Tests Ordered: Current medicines are reviewed at length with the patient today.  Concerns regarding medicines are outlined above.  No orders of the defined types were placed in this encounter.  Medication changes: No orders of the defined types were placed in this encounter.   Signed, Park Liter, MD, [facility] 06/18/2019 9:59 AM    [place]

## 2019-06-19 ENCOUNTER — Encounter: Payer: Self-pay | Admitting: Sports Medicine

## 2019-06-19 ENCOUNTER — Ambulatory Visit (INDEPENDENT_AMBULATORY_CARE_PROVIDER_SITE_OTHER): Payer: Medicare Other | Admitting: Sports Medicine

## 2019-06-19 ENCOUNTER — Ambulatory Visit: Payer: Medicare Other | Admitting: Sports Medicine

## 2019-06-19 DIAGNOSIS — Z79899 Other long term (current) drug therapy: Secondary | ICD-10-CM

## 2019-06-19 DIAGNOSIS — L603 Nail dystrophy: Secondary | ICD-10-CM

## 2019-06-19 DIAGNOSIS — L600 Ingrowing nail: Secondary | ICD-10-CM | POA: Diagnosis not present

## 2019-06-19 DIAGNOSIS — E119 Type 2 diabetes mellitus without complications: Secondary | ICD-10-CM | POA: Diagnosis not present

## 2019-06-19 NOTE — Progress Notes (Signed)
Subjective: Vickie Ferguson is a 64 y.o. female patient with history of diabetes who presents to office today complaining of long,mildly painful nails that feels ingrown on her 1st toes  while ambulating in shoes; unable to trim. Patient states that the glucose reading this morning was 168m/dl. Patient denies any new changes in medication or new problems.   Patient is assisted by daughter this visit.   Patient Active Problem List   Diagnosis Date Noted  . Congestive heart failure (CHF) ([place] 06/18/2019  . Pacemaker [place] 09/01/2018  . Second degree AV block 08/15/2018  . Pressure injury of skin 06/17/2018  . Unstable angina ([place] 06/17/2018  . OSA (obstructive sleep apnea) 04/26/2017  . Shortness of breath 04/26/2017  . TIA (transient ischemic attack) 04/26/2017  . Long term current use of insulin ([place] 01/07/2017  . Other long term (current) drug therapy 01/07/2017  . Essential hypertension 07/23/2014  . Iron deficiency anemia 07/23/2014  . Precordial pain 07/23/2014  . Type 2 diabetes mellitus without complication (HD[place] 038/17/7116 . Ventricular tachycardia (paroxysmal) ([place] 07/23/2014  . Morbid (severe) obesity due to excess calories ([place] 07/23/2014   Current Outpatient Medications on File Prior to Visit  Medication Sig Dispense Refill  . acetaminophen (TYLENOL) 500 MG tablet Take 500 mg by mouth every 6 (six) hours as needed for mild pain or headache.    .Marland Kitchenatorvastatin (LIPITOR) 40 MG tablet Take 40 mg by mouth at bedtime.      . clopidogrel (PLAVIX) 75 MG tablet Take 75 mg by mouth daily.     .Marland Kitchendexlansoprazole (DEXILANT) 60 MG capsule Take 60 mg by mouth daily.     . fluticasone (FLONASE) 50 MCG/ACT nasal spray Place 1 spray into both nostrils daily as needed for allergies. For allergies     . furosemide (LASIX) 40 MG tablet Take 0.5-1 tablets (20-40 mg total) by mouth See admin instructions. Take 424min the morning and 2065mn the evening. If gains 2 pounds  in three days then take an extra 72m64m the evening one time. 90 tablet 3  . levothyroxine (SYNTHROID, LEVOTHROID) 75 MCG tablet Take 75 mcg by mouth daily before breakfast.     . metFORMIN (GLUCOPHAGE) 500 MG tablet Take 500 mg by mouth 2 (two) times daily with a meal.     . metoprolol tartrate (LOPRESSOR) 25 MG tablet TAKE 1 TABLET(25 MG) BY MOUTH TWICE DAILY 180 tablet 1  . neomycin-polymyxin-hydrocortisone (CORTISPORIN) OTIC solution Apply 2-3 drops to the ingrown toenail site twice daily. Cover with band-aid. 10 mL 0  . nitroGLYCERIN (NITROSTAT) 0.4 MG SL tablet Place 1 tablet (0.4 mg total) under the tongue every 5 (five) minutes as needed. 25 tablet 6   No current facility-administered medications on file prior to visit.   Allergies  Allergen Reactions  . Aleve [Naproxen Sodium] Anaphylaxis and Swelling    Facial and hand swelling  . Procainamide Anaphylaxis and Swelling    Facial swelling  . Rythmol [Propafenone Hcl] Other (See Comments)    Bradycardia  . Codeine Nausea And Vomiting    Shakiness  . Doxycycline Other (See Comments)    Heart rythym issues  . Isosorbide Nitrate Other (See Comments)    Headaches  . Sertraline Hives and Rash    Large blisters. "Looked like I'd been burned".    Recent Results (from the past 2160 hour(s))  CUP PACEART REMOTE DEVICE CHECK     Status: None   Collection Time: 05/14/19  2:00 AM  Result Value Ref Range   Date Time Interrogation Session [phone]    Pulse Generator Manufacturer SJ[employer]    Pulse Gen Model 2272 Assurity MRI    Pulse Gen Serial Number [id number]    Clinic [facility]    Implantable Pulse Generator Type Implantable Pulse Generator    Implantable Pulse Generator Implant Date [id number]    Implantable Lead Manufacturer [facility]    Implantable Lead Model LPA1200M Tendril MRI    Implantable Lead Serial Number [id number]    Implantable Lead Implant Date [id number]    Implantable Lead Location Detail 1 UNKNOWN     Implantable Lead Location [id number]    Implantable Lead Manufacturer [facility]    Implantable Lead Model LPA1200M Tendril MRI    Implantable Lead Serial Number [id number]    Implantable Lead Implant Date [id number]    Implantable Lead Location Detail 1 UNKNOWN    Implantable Lead Location [phone]    Lead Channel Setting Sensing Sensitivity 2.0 mV   Lead Channel Setting Sensing Adaptation Mode Fixed Pacing    Lead Channel Setting Pacing Amplitude 2.0 V   Lead Channel Setting Pacing Pulse Width 0.5 ms   Lead Channel Setting Pacing Amplitude 2.5 V   Lead Channel Status NULL    Lead Channel Impedance Value 400 ohm   Lead Channel Sensing Intrinsic Amplitude 1.6 mV   Lead Channel Pacing Threshold Amplitude 0.75 V   Lead Channel Pacing Threshold Pulse Width 0.5 ms   Lead Channel Status NULL    Lead Channel Impedance Value 540 ohm   Lead Channel Sensing Intrinsic Amplitude 7.8 mV   Lead Channel Pacing Threshold Amplitude 0.5 V   Lead Channel Pacing Threshold Pulse Width 0.5 ms   Battery Status MOS    Battery Remaining Longevity 117 mo   Battery Remaining Percentage 95.5 %   Battery Voltage 3.02 V   Brady Statistic RA Percent Paced 8.5 %   Brady Statistic RV Percent Paced 9.6 %   Brady Statistic AP VP Percent 1.0 %   Brady Statistic AS VP Percent 9.5 %   Brady Statistic AP VS Percent 8.6 %   Brady Statistic AS VS Percent 82.0 %   Eval Rhythm SR at 81 bpm   TSH     Status: Abnormal   Collection Time: 06/18/19 10:13 AM  Result Value Ref Range   TSH 7.950 (H) 0.450 - 4.500 uIU/mL  Basic metabolic panel     Status: Abnormal   Collection Time: 06/18/19 10:13 AM  Result Value Ref Range   Glucose 136 (H) 65 - 99 mg/dL   BUN 22 8 - 27 mg/dL   Creatinine, Ser 1.45 (H) 0.57 - 1.00 mg/dL   GFR calc non Af Amer 38 (L) >59 mL/min/1.73   GFR calc Af Amer 44 (L) >59 mL/min/1.73    Comment: **[facility] currently reports eGFR in compliance with the current**   recommendations of the [employer].  [facility] will   update reporting as new guidelines are published from the NKF-ASN   Task force.    BUN/Creatinine Ratio 15 12 - 28   Sodium 136 134 - 144 mmol/L   Potassium 5.0 3.5 - 5.2 mmol/L   Chloride 98 96 - 106 mmol/L   CO2 24 20 - 29 mmol/L   Calcium 9.2 8.7 - 10.3 mg/dL    Objective: General: Patient is awake, alert, and oriented x 3 and in no acute distress.  Integument: Skin is warm, dry and supple bilateral.  Nails are tender, long and nondystrophic 1-5 bilateral with minimal ingrowing. No signs of infection. No open lesions or preulcerative lesions present bilateral. Remaining integument unremarkable.  Vasculature:  Dorsalis Pedis pulse 1/4 bilateral. Posterior Tibial pulse  1/4 bilateral. Capillary fill time <3 sec 1-5 bilateral. Positive hair growth to the level of the digits.Temperature gradient within normal limits. No varicosities present bilateral. No edema present bilateral.   Neurology: The patient has intact sensation measured with a 5.07/10g Semmes Weinstein Monofilament at all pedal sites bilateral.  Musculoskeletal: Asymptomatic pes planus pedal deformities noted bilateral. Muscular strength 5/5 in all lower extremity muscular groups bilateral without pain on range of motion . No tenderness with calf compression bilateral.  Assessment and Plan: Problem List Items Addressed This Visit      Other   Other long term (current) drug therapy    Other Visit Diagnoses    Diabetes mellitus without complication ([place])    -  Primary   Nail dystrophy       Ingrown nail          -Examined patient. -Discussed and educated patient on diabetic foot care, especially with  regards to the vascular, neurological and musculoskeletal systems.  -Stressed the importance of good glycemic control and the detriment of not  controlling glucose levels in relation to the foot. -Mechanically debrided all nails 1-5 bilateral using sterile nail nipper removing any ingrowing corners and  filed with dremel without incident  -Answered all patient questions -Patient to return  in 2.5 months for at risk foot care -Patient advised to call the office if any problems or questions arise in the meantime.  Landis Martins, DPM

## 2019-06-26 ENCOUNTER — Telehealth: Payer: Self-pay | Admitting: Emergency Medicine

## 2019-06-26 DIAGNOSIS — I5032 Chronic diastolic (congestive) heart failure: Secondary | ICD-10-CM

## 2019-06-26 DIAGNOSIS — I10 Essential (primary) hypertension: Secondary | ICD-10-CM

## 2019-06-26 NOTE — Telephone Encounter (Signed)
-----   Message from Park Liter, MD sent at 06/26/2019  1:17 PM EDT ----- Please make sure she takes 1 tablet of furosemide every day rather than 1 tablet alternating with half a tablet, Chem-7 proBNP need to be repeated in about a week

## 2019-06-26 NOTE — Telephone Encounter (Signed)
Called patient informed her per Dr. Agustin Cree to make sure she is only taking one tablet daily of lasix and to have lab work checked in 1 week. She verbally understood, she will call us if the decrease in lasix gives her any trouble. No further questions.

## 2019-06-30 ENCOUNTER — Other Ambulatory Visit: Payer: Self-pay | Admitting: Cardiology

## 2019-06-30 ENCOUNTER — Telehealth: Payer: Self-pay | Admitting: Cardiology

## 2019-06-30 MED ORDER — FUROSEMIDE 40 MG PO TABS: 20.0000 mg | ORAL_TABLET | ORAL | 3 refills | Status: DC

## 2019-06-30 NOTE — Telephone Encounter (Signed)
Refill sent in per request.  

## 2019-06-30 NOTE — Telephone Encounter (Signed)
*  STAT* If patient is at the pharmacy, call can be transferred to refill team.   1. Which medications need to be refilled? (please list name of each medication and dose if known)  furosemide (LASIX) 40 MG tablet  2. Which pharmacy/location (including street and city if local pharmacy) is medication to be sent to?  [facility] [place], [place]   3. Do they need a 30 day or 90 day supply? 30 with refills    PT has gone back up to her higher dose of the medication and because of this has run out of her medication

## 2019-06-30 NOTE — Telephone Encounter (Signed)
Pt c/o medication issue:  1. Name of Medication: furosemide (LASIX) 40 MG tablet  2. How are you currently taking this medication (dosage and times per day)? Pt has changed back to take 2 full pills (40 mg) today and to go back to 1 full pill tomorrow morning 1.5 tomorrow night (her original dose)  3. Are you having a reaction (difficulty breathing--STAT)? no  4. What is your medication issue? Pt went to see her PCP and her PCP adjusted the dosage of her medication because she had gained weight   Pt c/o swelling: STAT is pt has developed SOB within 24 hours  1) How much weight have you gained and in what time span? 4 pounds since Friday  2) If swelling, where is the swelling located? everywhere  3) Are you currently taking a fluid pill? yes  4) Are you currently SOB? At night.  Pt says it interrupts her sleep when she lays down   5) Do you have a log of your daily weights (if so, list)?   6) Have you gained 3 pounds in a day or 5 pounds in a week? 4 pounds 4 ounces since Friday   7) Have you traveled recently? no  Pt went to her PCP about her swelling and her PCP told her to just go back to her regular dose of lasix to avoid retaining water. She just wanted to let Dr. Raliegh Ip know

## 2019-07-03 ENCOUNTER — Other Ambulatory Visit: Payer: Self-pay | Admitting: Cardiology

## 2019-07-14 ENCOUNTER — Other Ambulatory Visit: Payer: Self-pay | Admitting: Cardiology

## 2019-08-07 ENCOUNTER — Other Ambulatory Visit: Payer: Self-pay

## 2019-08-07 MED ORDER — CLOPIDOGREL BISULFATE 75 MG PO TABS: 75.0000 mg | ORAL_TABLET | Freq: Every day | ORAL | 2 refills | Status: DC

## 2019-08-07 MED ORDER — NITROGLYCERIN 0.4 MG SL SUBL: 0.4000 mg | SUBLINGUAL_TABLET | SUBLINGUAL | 2 refills | Status: DC | PRN

## 2019-08-07 MED ORDER — ATORVASTATIN CALCIUM 40 MG PO TABS: 40.0000 mg | ORAL_TABLET | Freq: Every day | ORAL | 2 refills | Status: DC

## 2019-08-07 NOTE — Telephone Encounter (Signed)
Sent refills for Atorvastatin, Plavix, and Nitroglycerin to [facility].

## 2019-08-13 ENCOUNTER — Ambulatory Visit (INDEPENDENT_AMBULATORY_CARE_PROVIDER_SITE_OTHER): Payer: Medicare Other | Admitting: *Deleted

## 2019-08-13 DIAGNOSIS — I441 Atrioventricular block, second degree: Secondary | ICD-10-CM | POA: Diagnosis not present

## 2019-08-13 LAB — CUP PACEART REMOTE DEVICE CHECK
Battery Remaining Longevity: 115 mo
Battery Remaining Percentage: 95.5 %
Battery Voltage: 3.02 V
Brady Statistic AP VP Percent: 1 %
Brady Statistic AP VS Percent: 9.6 %
Brady Statistic AS VP Percent: 8.6 %
Brady Statistic AS VS Percent: 82 %
Brady Statistic RA Percent Paced: 9.4 %
Brady Statistic RV Percent Paced: 8.7 %
Date Time Interrogation Session: 20210812020014
Implantable Lead Implant Date: 20200814
Implantable Lead Implant Date: 20200814
Implantable Lead Location: 753859
Implantable Lead Location: 753860
Implantable Pulse Generator Implant Date: 20200814
Lead Channel Impedance Value: 430 Ohm
Lead Channel Impedance Value: 490 Ohm
Lead Channel Pacing Threshold Amplitude: 0.5 V
Lead Channel Pacing Threshold Amplitude: 0.75 V
Lead Channel Pacing Threshold Pulse Width: 0.5 ms
Lead Channel Pacing Threshold Pulse Width: 0.5 ms
Lead Channel Sensing Intrinsic Amplitude: 1.4 mV
Lead Channel Sensing Intrinsic Amplitude: 8.7 mV
Lead Channel Setting Pacing Amplitude: 2 V
Lead Channel Setting Pacing Amplitude: 2.5 V
Lead Channel Setting Pacing Pulse Width: 0.5 ms
Lead Channel Setting Sensing Sensitivity: 2 mV
Pulse Gen Model: 2272
Pulse Gen Serial Number: 9154171

## 2019-08-17 NOTE — Progress Notes (Signed)
Remote pacemaker transmission.   

## 2019-08-25 ENCOUNTER — Encounter: Payer: Self-pay | Admitting: Sports Medicine

## 2019-08-25 ENCOUNTER — Other Ambulatory Visit: Payer: Self-pay

## 2019-08-25 ENCOUNTER — Ambulatory Visit (INDEPENDENT_AMBULATORY_CARE_PROVIDER_SITE_OTHER): Payer: Medicare Other | Admitting: Sports Medicine

## 2019-08-25 DIAGNOSIS — E119 Type 2 diabetes mellitus without complications: Secondary | ICD-10-CM

## 2019-08-25 DIAGNOSIS — Z79899 Other long term (current) drug therapy: Secondary | ICD-10-CM

## 2019-08-25 DIAGNOSIS — L603 Nail dystrophy: Secondary | ICD-10-CM

## 2019-08-25 NOTE — Progress Notes (Signed)
Subjective: Vickie Ferguson is a 64 y.o. female patient with history of diabetes who presents to office today complaining of long,mildly painful nails. Reports that there is less soreness. Patient states that the glucose reading this morning was 127m/dl. A1c 6.8, PCP [place] x 2 months ago. Patient denies any new changes in medication or new problems.   Patient is assisted by grand/daughter this visit.   Patient Active Problem List   Diagnosis Date Noted  . Congestive heart failure (CHF) ([place] 06/18/2019  . Pacemaker [place] 09/01/2018  . Second degree AV block 08/15/2018  . Pressure injury of skin 06/17/2018  . Unstable angina (HT[place] 06/17/2018  . OSA (obstructive sleep apnea) 04/26/2017  . Shortness of breath 04/26/2017  . TIA (transient ischemic attack) 04/26/2017  . Long term current use of insulin (HParme[place] 01/07/2017  . Other long term (current) drug therapy 01/07/2017  . Essential hypertension 07/23/2014  . Iron deficiency anemia 07/23/2014  . Precordial pain 07/23/2014  . Type 2 diabetes mellitus without complication (HHopk[place] 038/25/0539 . Ventricular tachycardia (paroxysmal) (HG[place] 07/23/2014  . Morbid (severe) obesity due to excess calories (HHunts[place] 07/23/2014   Current Outpatient Medications on File Prior to Visit  Medication Sig Dispense Refill  . acetaminophen (TYLENOL) 500 MG tablet Take 500 mg by mouth every 6 (six) hours as needed for mild pain or headache.    .Marland Kitchenatorvastatin (LIPITOR) 40 MG tablet Take 1 tablet (40 mg total) by mouth at bedtime. 90 tablet 2  . clopidogrel (PLAVIX) 75 MG tablet Take 1 tablet (75 mg total) by mouth daily. 90 tablet 2  . dexlansoprazole (DEXILANT) 60 MG capsule Take 60 mg by mouth daily.     . fluticasone (FLONASE) 50 MCG/ACT nasal spray Place 1 spray into both nostrils daily as needed for allergies. For allergies     . furosemide (LASIX) 40 MG tablet Take 0.5-1 tablets (20-40 mg total) by mouth See admin instructions. Take 411min the  morning and 2032mn the evening. If gains 2 pounds in three days then take an extra 52m21m the evening one time. 90 tablet 3  . levothyroxine (SYNTHROID, LEVOTHROID) 75 MCG tablet Take 75 mcg by mouth daily before breakfast.     . metFORMIN (GLUCOPHAGE) 500 MG tablet Take 500 mg by mouth 2 (two) times daily with a meal.     . metoprolol tartrate (LOPRESSOR) 25 MG tablet TAKE 1 TABLET(25 MG) BY MOUTH TWICE DAILY 180 tablet 3  . neomycin-polymyxin-hydrocortisone (CORTISPORIN) OTIC solution Apply 2-3 drops to the ingrown toenail site twice daily. Cover with band-aid. 10 mL 0  . nitroGLYCERIN (NITROSTAT) 0.4 MG SL tablet Place 1 tablet (0.4 mg total) under the tongue every 5 (five) minutes as needed. 75 tablet 2   No current facility-administered medications on file prior to visit.   Allergies  Allergen Reactions  . Aleve [Naproxen Sodium] Anaphylaxis and Swelling    Facial and hand swelling  . Procainamide Anaphylaxis and Swelling    Facial swelling  . Rythmol [Propafenone Hcl] Other (See Comments)    Bradycardia  . Codeine Nausea And Vomiting    Shakiness  . Doxycycline Other (See Comments)    Heart rythym issues  . Isosorbide Nitrate Other (See Comments)    Headaches  . Sertraline Hives and Rash    Large blisters. "Looked like I'd been burned".    Recent Results (from the past 2160 hour(s))  TSH     Status: Abnormal   Collection Time: 06/18/19  10:13 AM  Result Value Ref Range   TSH 7.950 (H) 0.450 - 4.500 uIU/mL  Basic metabolic panel     Status: Abnormal   Collection Time: 06/18/19 10:13 AM  Result Value Ref Range   Glucose 136 (H) 65 - 99 mg/dL   BUN 22 8 - 27 mg/dL   Creatinine, Ser 1.45 (H) 0.57 - 1.00 mg/dL   GFR calc non Af Amer 38 (L) >59 mL/min/1.73   GFR calc Af Amer 44 (L) >59 mL/min/1.73    Comment: **[facility] currently reports eGFR in compliance with the current**   recommendations of the [employer]. [facility] will   update reporting as new  guidelines are published from the NKF-ASN   Task force.    BUN/Creatinine Ratio 15 12 - 28   Sodium 136 134 - 144 mmol/L   Potassium 5.0 3.5 - 5.2 mmol/L   Chloride 98 96 - 106 mmol/L   CO2 24 20 - 29 mmol/L   Calcium 9.2 8.7 - 10.3 mg/dL  CUP PACEART REMOTE DEVICE CHECK     Status: None   Collection Time: 08/13/19  2:00 AM  Result Value Ref Range   Date Time Interrogation Session [id number]    Pulse Generator Manufacturer [employer]    Pulse Gen Model 2272 Assurity MRI    Pulse Gen Serial Number [id number]    Clinic Name [facility]    Implantable Pulse Generator Type Implantable Pulse Generator    Implantable Pulse Generator Implant Date [id number]    Implantable Lead Manufacturer [facility]    Implantable Lead Model LPA1200M Tendril MRI    Implantable Lead Serial Number [id number]    Implantable Lead Implant Date [id number]    Implantable Lead Location Detail 1 UNKNOWN    Implantable Lead Location [id number]    Implantable Lead Manufacturer [facility]    Implantable Lead Model LPA1200M Tendril MRI    Implantable Lead Serial Number [id number]    Implantable Lead Implant Date [id number]    Implantable Lead Location Detail 1 UNKNOWN    Implantable Lead Location [id number]    Lead Channel Setting Sensing Sensitivity 2.0 mV   Lead Channel Setting Sensing Adaptation Mode Fixed Pacing    Lead Channel Setting Pacing Amplitude 2.0 V   Lead Channel Setting Pacing Pulse Width 0.5 ms   Lead Channel Setting Pacing Amplitude 2.5 V   Lead Channel Status NULL    Lead Channel Impedance Value 430 ohm   Lead Channel Sensing Intrinsic Amplitude 1.4 mV   Lead Channel Pacing Threshold Amplitude 0.75 V   Lead Channel Pacing Threshold Pulse Width 0.5 ms   Lead Channel Status NULL    Lead Channel Impedance Value 490 ohm   Lead Channel Sensing Intrinsic Amplitude 8.7 mV   Lead Channel Pacing Threshold Amplitude 0.5 V   Lead Channel Pacing Threshold Pulse Width 0.5 ms   Battery Status MOS    Battery Remaining Longevity 115  mo   Battery Remaining Percentage 95.5 %   Battery Voltage 3.02 V   Brady Statistic RA Percent Paced 9.4 %   Brady Statistic RV Percent Paced 8.7 %   Brady Statistic AP VP Percent 1.0 %   Brady Statistic AS VP Percent 8.6 %   Brady Statistic AP VS Percent 9.6 %   Brady Statistic AS VS Percent 82.0 %   Eval Rhythm SR at 79 bpm     Objective: General: Patient is awake, alert, and oriented x 3 and in no acute distress.  Integument: Skin is  warm, dry and supple bilateral. Nails are tender, long and nondystrophic 1-5 bilateral with no acute ingrowing. No signs of infection. No open lesions or preulcerative lesions present bilateral. Remaining integument unremarkable.  Vasculature:  Dorsalis Pedis pulse 1/4 bilateral. Posterior Tibial pulse  1/4 bilateral. Capillary fill time <3 sec 1-5 bilateral. Positive hair growth to the level of the digits.Temperature gradient within normal limits. No varicosities present bilateral. No edema present bilateral.   Neurology: The patient has intact sensation measured with a 5.07/10g Semmes Weinstein Monofilament at all pedal sites bilateral.  Musculoskeletal: Asymptomatic pes planus pedal deformities noted bilateral. Muscular strength 5/5 in all lower extremity muscular groups bilateral without pain on range of motion . No tenderness with calf compression bilateral.  Assessment and Plan: Problem List Items Addressed This Visit      Other   Other long term (current) drug therapy    Other Visit Diagnoses    Nail dystrophy    -  Primary   Diabetes mellitus without complication ([place])         -Examined patient. -Re-Discussed and educated patient on diabetic foot care -Mechanically debrided all nails 1-5 bilateral using sterile nail nipper without incident  -Answered all patient questions -Patient to return  in 4 months for at risk foot care -Patient advised to call the office if any problems or questions arise in the meantime.  Landis Martins, DPM

## 2019-09-11 DIAGNOSIS — N390 Urinary tract infection, site not specified: Secondary | ICD-10-CM | POA: Diagnosis not present

## 2019-09-21 DIAGNOSIS — R1084 Generalized abdominal pain: Secondary | ICD-10-CM | POA: Diagnosis not present

## 2019-09-21 DIAGNOSIS — R310 Gross hematuria: Secondary | ICD-10-CM | POA: Diagnosis not present

## 2019-09-21 DIAGNOSIS — N390 Urinary tract infection, site not specified: Secondary | ICD-10-CM | POA: Diagnosis not present

## 2019-09-21 DIAGNOSIS — E039 Hypothyroidism, unspecified: Secondary | ICD-10-CM | POA: Diagnosis not present

## 2019-09-21 DIAGNOSIS — N1831 Chronic kidney disease, stage 3a: Secondary | ICD-10-CM | POA: Diagnosis not present

## 2019-09-21 DIAGNOSIS — M1990 Unspecified osteoarthritis, unspecified site: Secondary | ICD-10-CM | POA: Diagnosis not present

## 2019-09-28 ENCOUNTER — Other Ambulatory Visit: Payer: Self-pay

## 2019-09-28 ENCOUNTER — Ambulatory Visit (INDEPENDENT_AMBULATORY_CARE_PROVIDER_SITE_OTHER): Payer: Medicare Other | Admitting: Cardiology

## 2019-09-28 ENCOUNTER — Encounter: Payer: Self-pay | Admitting: Cardiology

## 2019-09-28 VITALS — BP 154/80 | HR 65 | Ht 64.0 in | Wt 296.2 lb

## 2019-09-28 DIAGNOSIS — I441 Atrioventricular block, second degree: Secondary | ICD-10-CM

## 2019-09-28 NOTE — Progress Notes (Signed)
Electrophysiology Office Note   Date:  09/28/2019   ID:  Vickie Ferguson, Vickie Ferguson 01-15-1955, MRN [id number]  PCP:  Lowella Dandy, NP  Cardiologist: Agustin Cree Primary Electrophysiologist:  Constance Haw, MD    No chief complaint on file.    History of Present Illness: Vickie Ferguson is a 64 y.o. female who is being seen today for the evaluation of heart block at the request of Moon, Amy A, NP. Presenting today for electrophysiology evaluation.  She has a history significant for hypertension, diabetes, OSA.  She has had multiple episodes of bradycardia with some dropped beats.  Beta-blocker has been stopped.  Her main symptoms are fatigue and weakness.  She is also noted that her blood pressure drops at times.  Today, denies symptoms of palpitations, chest pain, shortness of breath, orthopnea, PND, lower extremity edema, claudication, dizziness, presyncope, syncope, bleeding, or neurologic sequela. The patient is tolerating medications without difficulties.  From a cardiac standpoint she is doing well.  She has no chest pain or shortness of breath.  She has no palpitations.  Unfortunately, it appears that her kidneys are not doing well and she is having some back issues.  These are currently being worked up.   Past Medical History:  Diagnosis Date  . Anemia   . Anginal pain ([place])   . Arthritis   . CKD (chronic kidney disease), stage III   . Complication of anesthesia   . Diabetes mellitus without complication ([place])   . Family history of adverse reaction to anesthesia    " FAMILY MEMBERS HAD DIFFICULTY WAKING "  . Hypertension   . Hypothyroidism   . PONV (postoperative nausea and vomiting)   . Presence of permanent cardiac pacemaker 08/15/2018   [employer] Jude [employer]urity MRI dual-chamber pacemaker for symptomatic bradycardia  . Second degree AV block    Past Surgical History:  Procedure Laterality Date  . CARDIAC CATHETERIZATION    . INSERT / REPLACE / REMOVE  PACEMAKER  08/15/2018   St Jude Medical Assurity MRI dual-chamber pacemaker for symptomatic bradycardia  . LEFT HEART CATH AND CORONARY ANGIOGRAPHY N/A 06/17/2018   Procedure: LEFT HEART CATH AND CORONARY ANGIOGRAPHY;  Surgeon: Martinique, Peter M, MD;  Location: [facility];  Service: Cardiovascular;  Laterality: N/A;  . PACEMAKER IMPLANT N/A 08/15/2018   Procedure: PACEMAKER IMPLANT;  Surgeon: Constance Haw, MD;  Location: [facility];  Service: Cardiovascular;  Laterality: N/A;  . SHOULDER SURGERY Right 2012   METAL PLATE   . TOTAL KNEE ARTHROPLASTY Right 2001     Current Outpatient Medications  Medication Sig Dispense Refill  . acetaminophen (TYLENOL) 500 MG tablet Take 500 mg by mouth every 6 (six) hours as needed for mild pain or headache.    Marland Kitchen atorvastatin (LIPITOR) 40 MG tablet Take 1 tablet (40 mg total) by mouth at bedtime. 90 tablet 2  . clopidogrel (PLAVIX) 75 MG tablet Take 1 tablet (75 mg total) by mouth daily. 90 tablet 2  . dexlansoprazole (DEXILANT) 60 MG capsule Take 60 mg by mouth daily.     . fluticasone (FLONASE) 50 MCG/ACT nasal spray Place 1 spray into both nostrils daily as needed for allergies. For allergies     . furosemide (LASIX) 40 MG tablet Take 0.5-1 tablets (20-40 mg total) by mouth See admin instructions. Take 40mg  in the morning and 20mg  in the evening. If gains 2 pounds in three days then take an extra 20mg  in the  evening one time. 90 tablet 3  . levothyroxine (SYNTHROID, LEVOTHROID) 75 MCG tablet Take 75 mcg by mouth daily before breakfast.     . metFORMIN (GLUCOPHAGE) 500 MG tablet Take 500 mg by mouth 2 (two) times daily with a meal.     . metoprolol tartrate (LOPRESSOR) 25 MG tablet TAKE 1 TABLET(25 MG) BY MOUTH TWICE DAILY 180 tablet 3  . neomycin-polymyxin-hydrocortisone (CORTISPORIN) OTIC solution Apply 2-3 drops to the ingrown toenail site twice daily. Cover with band-aid. 10 mL 0  . nitroGLYCERIN (NITROSTAT) 0.4 MG SL tablet Place 1  tablet (0.4 mg total) under the tongue every 5 (five) minutes as needed. 75 tablet 2  . Oxycodone HCl 10 MG TABS Take 1 tablet by mouth as needed.     No current facility-administered medications for this visit.    Allergies:   Aleve [naproxen sodium], Procainamide, Rythmol [propafenone hcl], Codeine, Doxycycline, Isosorbide nitrate, and Sertraline   Social History:  The patient  reports that she has never smoked. She has never used smokeless tobacco. She reports that she does not drink alcohol and does not use drugs.   Family History:  [name] family history includes Heart disease in her mother.   ROS:  Please see the history of present illness.   Otherwise, review of systems is positive for none.   All other systems are reviewed and negative.   PHYSICAL EXAM: VS:  BP (!) 154/80   Pulse 65   Ht $R'5\' 4"'mb$  (1.626 m)   Wt 296 lb 3.2 oz (134.4 kg)   SpO2 96%   BMI 50.84 kg/m  , BMI Body mass index is 50.84 kg/m. GEN: Well nourished, well developed, in no acute distress  HEENT: normal  Neck: no JVD, carotid bruits, or masses Cardiac: RRR; no murmurs, rubs, or gallops,no edema  Respiratory:  clear to auscultation bilaterally, normal work of breathing GI: soft, nontender, nondistended, + BS MS: no deformity or atrophy  Skin: warm and dry, device site well healed Neuro:  Strength and sensation are intact Psych: euthymic mood, full affect  EKG:  EKG is ordered today. Personal review of the ekg ordered shows sinus rhythm, rate branch block, left anterior fascicular block  Personal review of the device interrogation today. Results in [facility]: 06/18/2019: BUN 22; Creatinine, Ser 1.45; Potassium 5.0; Sodium 136; TSH 7.950    Lipid Panel  No results found for: CHOL, TRIG, HDL, CHOLHDL, VLDL, LDLCALC, LDLDIRECT   Wt Readings from Last 3 Encounters:  09/28/19 296 lb 3.2 oz (134.4 kg)  06/18/19 291 lb (132 kg)  01/16/19 293 lb (132.9 kg)      Other studies  Reviewed: Additional studies/ records that were reviewed today include: TTE 06/10/18  Review of the above records today demonstrates:   1. The left ventricle has normal systolic function, with an ejection fraction of 55-60%. The cavity size was normal. Left ventricular diastolic function could not be evaluated.  2. The right ventricle has normal systolic function. The cavity was normal. There is no increase in right ventricular wall thickness.  3. Mitral valve regurgitation is mild to moderate by color flow Doppler.  4. The tricuspid valve is grossly normal.  5. The aortic valve is grossly normal. Moderate sclerosis of the aortic valve. Aortic valve regurgitation is trivial by color flow Doppler.  LHC 2/62/03  LV end diastolic pressure is normal.   1. Normal coronary anatomy 2. Normal LVEDP  Cardiac monitor 07/13/2018 personally reviewed Baseline rhythm: Sinus  rhythm with intraventricular conduction delay Minimum heart rate: 38 BPM.  Average heart rate: 66 BPM.  Maximal heart rate 92 BPM. Conduction abnormality: Multiple episodes of 2-1 AV conduction, as well as second-degree type II AV block.  Symptoms: Multiple symptomatology many times related to AV block.   ASSESSMENT AND PLAN:  1.  Second-degree AV block: Status post Saint Jude dual-chamber pacemaker implanted 08/15/2018.  Device functioning appropriately.  No changes.    2.  Hypertension: Elevated today.  She is currently undergoing work-up for her back issues and kidney issues.  Her blood pressures have been normal since being seen the last 2 times in this office.  No changes.   Current medicines are reviewed at length with the patient today.   The patient does not have concerns regarding her medicines.  The following changes were made today: None  Labs/ tests ordered today include:  Orders Placed This Encounter  Procedures  . EKG 12-Lead    Disposition:   FU with Javanna Patin 12 months  Signed, Alexa Blish Meredith Leeds, MD   09/28/2019 9:55 AM     [facility] [address] [place]  [place] [address] [phone] (office) [phone] (fax)

## 2019-09-28 NOTE — Patient Instructions (Signed)
Medication Instructions:  Your physician recommends that you continue on your current medications as directed. Please refer to the Current Medication list given to you today.  *If you need a refill on your cardiac medications before your next appointment, please call your pharmacy*   Lab Work: None ordered If you have labs (blood work) drawn today and your tests are completely normal, you will receive your results only by: Marland Kitchen MyChart Message (if you have MyChart) OR . A paper copy in the mail If you have any lab test that is abnormal or we need to change your treatment, we will call you to review the results.   Testing/Procedures: None ordered   Follow-Up: At [facility], you and your health needs are our priority.  As part of our continuing mission to provide you with exceptional heart care, we have created designated Provider Care Teams.  These Care Teams include your primary Cardiologist (physician) and Advanced Practice Providers (APPs -  Physician Assistants and Nurse Practitioners) who all work together to provide you with the care you need, when you need it.  We recommend signing up for the patient portal called "MyChart".  Sign up information is provided on this After Visit Summary.  MyChart is used to connect with patients for Virtual Visits (Telemedicine).  Patients are able to view lab/test results, encounter notes, upcoming appointments, etc.  Non-urgent messages can be sent to your provider as well.   To learn more about what you can do with MyChart, go to [email or web address].    Remote monitoring is used to monitor your Pacemaker or ICD from home. This monitoring reduces the number of office visits required to check your device to one time per year. It allows [place] to keep an eye on the functioning of your device to ensure it is working properly. You are scheduled for a device check from home on 11/12/2019. You may send your transmission at any time that day. If you have a  wireless device, the transmission will be sent automatically. After your physician reviews your transmission, you will receive a postcard with your next transmission date.  Your next appointment:   1 year(s)  The format for your next appointment:   In Person  Provider:   Allegra Lai, MD   Thank you for choosing [facility]!!   Trinidad Curet, RN [phone]

## 2019-09-29 DIAGNOSIS — R109 Unspecified abdominal pain: Secondary | ICD-10-CM | POA: Diagnosis not present

## 2019-09-29 DIAGNOSIS — R1084 Generalized abdominal pain: Secondary | ICD-10-CM | POA: Diagnosis not present

## 2019-09-29 DIAGNOSIS — K5730 Diverticulosis of large intestine without perforation or abscess without bleeding: Secondary | ICD-10-CM | POA: Diagnosis not present

## 2019-09-29 DIAGNOSIS — D649 Anemia, unspecified: Secondary | ICD-10-CM | POA: Diagnosis not present

## 2019-11-02 ENCOUNTER — Other Ambulatory Visit: Payer: Self-pay | Admitting: Cardiology

## 2019-11-02 MED ORDER — FUROSEMIDE 40 MG PO TABS: 20.0000 mg | ORAL_TABLET | ORAL | 3 refills | Status: DC

## 2019-11-02 NOTE — Telephone Encounter (Signed)
°*  STAT* If patient is at the pharmacy, call can be transferred to refill team.   1. Which medications need to be refilled? (please list name of each medication and dose if known) furosemide (LASIX) 40 MG tablet  2. Which pharmacy/location (including street and city if local pharmacy) is medication to be sent to? [facility], [place].  3. Do they need a 30 day or 90 day supply? 90 day supply

## 2019-11-02 NOTE — Telephone Encounter (Signed)
Refill sent in per request.  

## 2019-11-06 DIAGNOSIS — K219 Gastro-esophageal reflux disease without esophagitis: Secondary | ICD-10-CM | POA: Diagnosis not present

## 2019-11-06 DIAGNOSIS — Z23 Encounter for immunization: Secondary | ICD-10-CM | POA: Diagnosis not present

## 2019-11-06 DIAGNOSIS — I10 Essential (primary) hypertension: Secondary | ICD-10-CM | POA: Diagnosis not present

## 2019-11-06 DIAGNOSIS — E039 Hypothyroidism, unspecified: Secondary | ICD-10-CM | POA: Diagnosis not present

## 2019-11-06 DIAGNOSIS — D539 Nutritional anemia, unspecified: Secondary | ICD-10-CM | POA: Diagnosis not present

## 2019-11-06 DIAGNOSIS — E785 Hyperlipidemia, unspecified: Secondary | ICD-10-CM | POA: Diagnosis not present

## 2019-11-06 DIAGNOSIS — E1122 Type 2 diabetes mellitus with diabetic chronic kidney disease: Secondary | ICD-10-CM | POA: Diagnosis not present

## 2019-11-06 DIAGNOSIS — N183 Chronic kidney disease, stage 3 unspecified: Secondary | ICD-10-CM | POA: Diagnosis not present

## 2019-11-06 DIAGNOSIS — M1990 Unspecified osteoarthritis, unspecified site: Secondary | ICD-10-CM | POA: Diagnosis not present

## 2019-11-06 DIAGNOSIS — Z20822 Contact with and (suspected) exposure to covid-19: Secondary | ICD-10-CM | POA: Diagnosis not present

## 2019-11-06 DIAGNOSIS — N1831 Chronic kidney disease, stage 3a: Secondary | ICD-10-CM | POA: Diagnosis not present

## 2019-11-12 ENCOUNTER — Ambulatory Visit (INDEPENDENT_AMBULATORY_CARE_PROVIDER_SITE_OTHER): Payer: Medicare Other

## 2019-11-12 DIAGNOSIS — I441 Atrioventricular block, second degree: Secondary | ICD-10-CM

## 2019-11-12 LAB — CUP PACEART REMOTE DEVICE CHECK
Battery Remaining Longevity: 110 mo
Battery Remaining Percentage: 95 %
Battery Voltage: 3.02 V
Brady Statistic AP VP Percent: 1 %
Brady Statistic AP VS Percent: 16 %
Brady Statistic AS VP Percent: 4.7 %
Brady Statistic AS VS Percent: 79 %
Brady Statistic RA Percent Paced: 15 %
Brady Statistic RV Percent Paced: 5.1 %
Date Time Interrogation Session: 20211111020014
Implantable Lead Implant Date: 20200814
Implantable Lead Implant Date: 20200814
Implantable Lead Location: 753859
Implantable Lead Location: 753860
Implantable Pulse Generator Implant Date: 20200814
Lead Channel Impedance Value: 430 Ohm
Lead Channel Impedance Value: 450 Ohm
Lead Channel Pacing Threshold Amplitude: 0.5 V
Lead Channel Pacing Threshold Amplitude: 1.25 V
Lead Channel Pacing Threshold Pulse Width: 0.5 ms
Lead Channel Pacing Threshold Pulse Width: 0.5 ms
Lead Channel Sensing Intrinsic Amplitude: 2 mV
Lead Channel Sensing Intrinsic Amplitude: 7.9 mV
Lead Channel Setting Pacing Amplitude: 2.5 V
Lead Channel Setting Pacing Amplitude: 2.5 V
Lead Channel Setting Pacing Pulse Width: 0.5 ms
Lead Channel Setting Sensing Sensitivity: 2 mV
Pulse Gen Model: 2272
Pulse Gen Serial Number: 9154171

## 2019-11-13 NOTE — Progress Notes (Signed)
Remote pacemaker transmission.   

## 2019-11-18 DIAGNOSIS — Z8489 Family history of other specified conditions: Secondary | ICD-10-CM | POA: Insufficient documentation

## 2019-11-18 DIAGNOSIS — Z9889 Other specified postprocedural states: Secondary | ICD-10-CM | POA: Insufficient documentation

## 2019-11-18 DIAGNOSIS — I10 Essential (primary) hypertension: Secondary | ICD-10-CM | POA: Insufficient documentation

## 2019-11-18 DIAGNOSIS — E119 Type 2 diabetes mellitus without complications: Secondary | ICD-10-CM | POA: Insufficient documentation

## 2019-11-18 DIAGNOSIS — E039 Hypothyroidism, unspecified: Secondary | ICD-10-CM | POA: Insufficient documentation

## 2019-11-18 DIAGNOSIS — D649 Anemia, unspecified: Secondary | ICD-10-CM | POA: Insufficient documentation

## 2019-11-18 DIAGNOSIS — I209 Angina pectoris, unspecified: Secondary | ICD-10-CM | POA: Insufficient documentation

## 2019-11-18 DIAGNOSIS — M1990 Unspecified osteoarthritis, unspecified site: Secondary | ICD-10-CM | POA: Insufficient documentation

## 2019-11-18 DIAGNOSIS — R112 Nausea with vomiting, unspecified: Secondary | ICD-10-CM | POA: Insufficient documentation

## 2019-11-18 DIAGNOSIS — N183 Chronic kidney disease, stage 3 unspecified: Secondary | ICD-10-CM | POA: Insufficient documentation

## 2019-11-18 DIAGNOSIS — T8859XA Other complications of anesthesia, initial encounter: Secondary | ICD-10-CM | POA: Insufficient documentation

## 2019-11-19 ENCOUNTER — Encounter: Payer: Self-pay | Admitting: Cardiology

## 2019-11-19 ENCOUNTER — Ambulatory Visit (INDEPENDENT_AMBULATORY_CARE_PROVIDER_SITE_OTHER): Payer: Medicare Other | Admitting: Cardiology

## 2019-11-19 ENCOUNTER — Other Ambulatory Visit: Payer: Self-pay

## 2019-11-19 VITALS — BP 150/94 | HR 70 | Ht 64.0 in | Wt 299.0 lb

## 2019-11-19 DIAGNOSIS — I441 Atrioventricular block, second degree: Secondary | ICD-10-CM | POA: Diagnosis not present

## 2019-11-19 DIAGNOSIS — I10 Essential (primary) hypertension: Secondary | ICD-10-CM | POA: Diagnosis not present

## 2019-11-19 DIAGNOSIS — Z950 Presence of cardiac pacemaker: Secondary | ICD-10-CM | POA: Diagnosis not present

## 2019-11-19 DIAGNOSIS — R002 Palpitations: Secondary | ICD-10-CM

## 2019-11-19 HISTORY — DX: Palpitations: R00.2

## 2019-11-19 LAB — BASIC METABOLIC PANEL
BUN/Creatinine Ratio: 15 (ref 12–28)
BUN: 23 mg/dL (ref 8–27)
CO2: 24 mmol/L (ref 20–29)
Calcium: 9.3 mg/dL (ref 8.7–10.3)
Chloride: 98 mmol/L (ref 96–106)
Creatinine, Ser: 1.49 mg/dL — ABNORMAL HIGH (ref 0.57–1.00)
GFR calc Af Amer: 42 mL/min/{1.73_m2} — ABNORMAL LOW (ref >59)
GFR calc non Af Amer: 37 mL/min/{1.73_m2} — ABNORMAL LOW (ref >59)
Glucose: 147 mg/dL — ABNORMAL HIGH (ref 65–99)
Potassium: 4.7 mmol/L (ref 3.5–5.2)
Sodium: 135 mmol/L (ref 134–144)

## 2019-11-19 LAB — MAGNESIUM: Magnesium: 2.2 mg/dL (ref 1.6–2.3)

## 2019-11-19 MED ORDER — METOPROLOL TARTRATE 25 MG PO TABS: 37.5000 mg | ORAL_TABLET | Freq: Two times a day (BID) | ORAL | 2 refills | Status: DC

## 2019-11-19 NOTE — Patient Instructions (Addendum)
Medication Instructions:  Your physician has recommended you make the following change in your medication:   Increase your metoprolol to 37.5 mg twice daily  *If you need a refill on your cardiac medications before your next appointment, please call your pharmacy*   Lab Work: Your physician recommends that you have labs done in the office today. Your test included  basic metabolic panel and magnesium.  If you have labs (blood work) drawn today and your tests are completely normal, you will receive your results only by: Marland Kitchen MyChart Message (if you have MyChart) OR . A paper copy in the mail If you have any lab test that is abnormal or we need to change your treatment, we will call you to review the results.   Testing/Procedures: Your physician has requested that you have an echocardiogram. Echocardiography is a painless test that uses sound waves to create images of your heart. It provides your doctor with information about the size and shape of your heart and how well your heart's chambers and valves are working. This procedure takes approximately one hour. There are no restrictions for this procedure.     Follow-Up: At [facility], you and your health needs are our priority.  As part of our continuing mission to provide you with exceptional heart care, we have created designated Provider Care Teams.  These Care Teams include your primary Cardiologist (physician) and Advanced Practice Providers (APPs -  Physician Assistants and Nurse Practitioners) who all work together to provide you with the care you need, when you need it.  We recommend signing up for the patient portal called "MyChart".  Sign up information is provided on this After Visit Summary.  MyChart is used to connect with patients for Virtual Visits (Telemedicine).  Patients are able to view lab/test results, encounter notes, upcoming appointments, etc.  Non-urgent messages can be sent to your provider as well.   To learn more  about what you can do with MyChart, go to [email or web address].    Your next appointment:   5 month(s)  The format for your next appointment:   In Person  Provider:   Jenne Campus, MD   Other Instructions  Echocardiogram An echocardiogram is a procedure that uses painless sound waves (ultrasound) to produce an image of the heart. Images from an echocardiogram can provide important information about:  Signs of coronary artery disease (CAD).  Aneurysm detection. An aneurysm is a weak or damaged part of an artery wall that bulges out from the normal force of blood pumping through the body.  Heart size and shape. Changes in the size or shape of the heart can be associated with certain conditions, including heart failure, aneurysm, and CAD.  Heart muscle function.  Heart valve function.  Signs of a past heart attack.  Fluid buildup around the heart.  Thickening of the heart muscle.  A tumor or infectious growth around the heart valves. Tell a health care provider about:  Any allergies you have.  All medicines you are taking, including vitamins, herbs, eye drops, creams, and over-the-counter medicines.  Any blood disorders you have.  Any surgeries you have had.  Any medical conditions you have.  Whether you are pregnant or may be pregnant. What are the risks? Generally, this is a safe procedure. However, problems may occur, including:  Allergic reaction to dye (contrast) that may be used during the procedure. What happens before the procedure? No specific preparation is needed. You may eat and drink normally. What happens during  the procedure?   An IV tube may be inserted into one of your veins.  You may receive contrast through this tube. A contrast is an injection that improves the quality of the pictures from your heart.  A gel will be applied to your chest.  A wand-like tool (transducer) will be moved over your chest. The gel will help to transmit  the sound waves from the transducer.  The sound waves will harmlessly bounce off of your heart to allow the heart images to be captured in real-time motion. The images will be recorded on a computer. The procedure may vary among health care providers and hospitals. What happens after the procedure?  You may return to your normal, everyday life, including diet, activities, and medicines, unless your health care provider tells you not to do that. Summary  An echocardiogram is a procedure that uses painless sound waves (ultrasound) to produce an image of the heart.  Images from an echocardiogram can provide important information about the size and shape of your heart, heart muscle function, heart valve function, and fluid buildup around your heart.  You do not need to do anything to prepare before this procedure. You may eat and drink normally.  After the echocardiogram is completed, you may return to your normal, everyday life, unless your health care provider tells you not to do that. This information is not intended to replace advice given to you by your health care provider. Make sure you discuss any questions you have with your health care provider. Document Revised: 04/10/2018 Document Reviewed: 01/21/2016 Elsevier Patient Education  [place].

## 2019-11-19 NOTE — Progress Notes (Signed)
Cardiology Office Note:    Date:  11/19/2019   ID:  Vickie Ferguson [address], DOB [birth year], MRN [id number]  PCP:  Lowella Dandy, NP  Cardiologist:  Jenne Campus, MD    Referring MD: Lowella Dandy, NP   Chief Complaint  Patient presents with  . Follow-up  I am having more palpitations  History of Present Illness:    Vickie Ferguson is a 64 y.o. female with past medical history significant for diastolic congestive heart failure, second-degree AV block that being addressed with pacemaker, essential hypertension, morbid obesity, palpitations, PVCs.  She comes today to my office complaining of having more PVCs.  It bothers her a lot within last few weeks.  No dizziness no passing out.  Swelling of lower extremities is sometimes there at evening time.  She regulate this by changing the dose of furosemide.  Denies have any chest pain tightness squeezing pressure burning chest.  Past Medical History:  Diagnosis Date  . Anemia   . Anginal pain ([place])   . Arthritis   . CKD (chronic kidney disease), stage III ([place])   . Complication of anesthesia   . Congestive heart failure (CHF) ([place]) 06/18/2019  . Diabetes mellitus without complication ([place])   . Essential hypertension 07/23/2014  . Family history of adverse reaction to anesthesia    " FAMILY MEMBERS HAD DIFFICULTY WAKING "  . Hypertension   . Hypothyroidism   . Iron deficiency anemia 07/23/2014  . Long term current use of insulin ([place]) 01/07/2017  . Morbid (severe) obesity due to excess calories ([place]) 07/23/2014  . OSA (obstructive sleep apnea) 04/26/2017  . Other long term (current) drug therapy 01/07/2017  . Pacemaker [place] device 09/01/2018  . PONV (postoperative nausea and vomiting)   . Precordial pain 07/23/2014  . Presence of permanent cardiac pacemaker 08/15/2018   St Jude Medical Assurity MRI dual-chamber pacemaker for symptomatic bradycardia  . Pressure injury of skin 06/17/2018  . Second degree AV block   . Shortness of  breath 04/26/2017  . TIA (transient ischemic attack) 04/26/2017  . Type 2 diabetes mellitus without complication ([place]) 7/67/2094  . Unstable angina ([place]) 06/17/2018  . Ventricular tachycardia (paroxysmal) ([place]) 07/23/2014    Past Surgical History:  Procedure Laterality Date  . CARDIAC CATHETERIZATION    . INSERT / REPLACE / REMOVE PACEMAKER  08/15/2018   St Jude Medical Assurity MRI dual-chamber pacemaker for symptomatic bradycardia  . LEFT HEART CATH AND CORONARY ANGIOGRAPHY N/A 06/17/2018   Procedure: LEFT HEART CATH AND CORONARY ANGIOGRAPHY;  Surgeon: Martinique, Peter M, MD;  Location: [facility];  Service: Cardiovascular;  Laterality: N/A;  . PACEMAKER IMPLANT N/A 08/15/2018   Procedure: PACEMAKER IMPLANT;  Surgeon: Constance Haw, MD;  Location: [facility];  Service: Cardiovascular;  Laterality: N/A;  . SHOULDER SURGERY Right 2012   METAL PLATE   . TOTAL KNEE ARTHROPLASTY Right 2001    Current Medications: Current Meds  Medication Sig  . acetaminophen (TYLENOL) 500 MG tablet Take 500 mg by mouth every 6 (six) hours as needed for mild pain or headache.  Marland Kitchen atorvastatin (LIPITOR) 40 MG tablet Take 1 tablet (40 mg total) by mouth at bedtime.  . clopidogrel (PLAVIX) 75 MG tablet Take 1 tablet (75 mg total) by mouth daily.  Marland Kitchen dexlansoprazole (DEXILANT) 60 MG capsule Take 60 mg by mouth daily.   . fluticasone (FLONASE) 50 MCG/ACT nasal spray Place 1 spray into both nostrils daily as needed for  allergies. For allergies   . furosemide (LASIX) 40 MG tablet Take 0.5-1 tablets (20-40 mg total) by mouth See admin instructions. Take 40mg  in the morning and 20mg  in the evening. If gains 2 pounds in three days then take an extra 20mg  in the evening one time.  Marland Kitchen levothyroxine (SYNTHROID, LEVOTHROID) 75 MCG tablet Take 75 mcg by mouth daily before breakfast.   . metFORMIN (GLUCOPHAGE) 500 MG tablet Take 500 mg by mouth 2 (two) times daily with a meal.   . metoprolol tartrate  (LOPRESSOR) 25 MG tablet TAKE 1 TABLET(25 MG) BY MOUTH TWICE DAILY  . nitroGLYCERIN (NITROSTAT) 0.4 MG SL tablet Place 1 tablet (0.4 mg total) under the tongue every 5 (five) minutes as needed.  . Oxycodone HCl 10 MG TABS Take 1 tablet by mouth as needed.  . polyvinyl alcohol (LIQUIFILM TEARS) 1.4 % ophthalmic solution Place 1 drop into both eyes as needed for dry eyes.     Allergies:   Aleve [naproxen sodium], Procainamide, Rythmol [propafenone hcl], Codeine, Doxycycline, Isosorbide nitrate, and Sertraline   Social History   Socioeconomic History  . Marital status: Legally Separated    Spouse name: Not on file  . Number of children: Not on file  . Years of education: Not on file  . Highest education level: Not on file  Occupational History  . Not on file  Tobacco Use  . Smoking status: Never Smoker  . Smokeless tobacco: Never Used  Vaping Use  . Vaping Use: Never used  Substance and Sexual Activity  . Alcohol use: No    Alcohol/week: 0.0 standard drinks  . Drug use: No  . Sexual activity: Not on file  Other Topics Concern  . Not on file  Social History Narrative  . Not on file   Social Determinants of Health   Financial Resource Strain:   . Difficulty of Paying Living Expenses: Not on file  Food Insecurity:   . Worried About Charity fundraiser in the Last Year: Not on file  . Ran Out of Food in the Last Year: Not on file  Transportation Needs:   . Lack of Transportation (Medical): Not on file  . Lack of Transportation (Non-Medical): Not on file  Physical Activity:   . Days of Exercise per Week: Not on file  . Minutes of Exercise per Session: Not on file  Stress:   . Feeling of Stress : Not on file  Social Connections:   . Frequency of Communication with Friends and Family: Not on file  . Frequency of Social Gatherings with Friends and Family: Not on file  . Attends Religious Services: Not on file  . Active Member of Clubs or Organizations: Not on file  . Attends  Archivist Meetings: Not on file  . Marital Status: Not on file     Family History: [name] family history includes Heart disease in her mother. ROS:   Please see the history of present illness.    All 14 point review of systems negative except as described per history of present illness  EKGs/Labs/Other Studies Reviewed:      Recent Labs: 06/18/2019: BUN 22; Creatinine, Ser 1.45; Potassium 5.0; Sodium 136; TSH 7.950  Recent Lipid Panel No results found for: CHOL, TRIG, HDL, CHOLHDL, VLDL, LDLCALC, LDLDIRECT  Physical Exam:    VS:  BP (!) 150/94 (BP Location: Left Arm, Patient Position: Sitting, Cuff Size: Large)   Pulse 70   Ht 5\' 4"  (1.626 m)  Wt 299 lb (135.6 kg)   SpO2 95%   BMI 51.32 kg/m     Wt Readings from Last 3 Encounters:  11/19/19 299 lb (135.6 kg)  09/28/19 296 lb 3.2 oz (134.4 kg)  06/18/19 291 lb (132 kg)     GEN:  Well nourished, well developed in no acute distress HEENT: Normal NECK: No JVD; No carotid bruits LYMPHATICS: No lymphadenopathy CARDIAC: RRR, no murmurs, no rubs, no gallops RESPIRATORY:  Clear to auscultation without rales, wheezing or rhonchi  ABDOMEN: Soft, non-tender, non-distended MUSCULOSKELETAL:  No edema; No deformity  SKIN: Warm and dry LOWER EXTREMITIES: no swelling NEUROLOGIC:  Alert and oriented x 3 PSYCHIATRIC:  Normal affect   ASSESSMENT:    1. Second degree AV block   2. Palpitations   3. Essential hypertension   4. Pacemaker Saint Jude device    PLAN:    In order of problems listed above:  1. Palpitations: Does have PVCs.  I asked him to increase dose of metoprolol from 25 twice daily to 1-1/2 tablet twice a day.  I also will check a Chem-7 as well as magnesium today make sure we do not have electrolyte abnormalities that are responsible for her worsening of palpitations. 2. Secondary AV block that is being addressed with pacemaker. 3. Essential hypertension blood pressure seems to be well  controlled. 4. Pacemaker central device: I did review interrogation, there is no worrisome arrhythmia.  We will continue present management. 5. Dyspnea on exertion which is ongoing problem, I will schedule her to have echocardiogram to assess left ventricle ejection fraction.  Another reason for doing this is frequent ventricular ectopy I will make sure she does not develop cardiomyopathy because of this.  About a year and a half ago she did have evaluation of her coronary artery disease with cardiac catheterization which showed nonobstructive lesions.   Medication Adjustments/Labs and Tests Ordered: Current medicines are reviewed at length with the patient today.  Concerns regarding medicines are outlined above.  No orders of the defined types were placed in this encounter.  Medication changes: No orders of the defined types were placed in this encounter.   Signed, Park Liter, MD, [facility] 11/19/2019 9:35 AM    [place]

## 2019-11-30 ENCOUNTER — Other Ambulatory Visit: Payer: Self-pay | Admitting: Oncology

## 2019-11-30 DIAGNOSIS — N189 Chronic kidney disease, unspecified: Secondary | ICD-10-CM

## 2019-11-30 NOTE — Progress Notes (Signed)
[facility]  [address] [place],    [address] [phone]  Clinic Day:  12/01/2019  Referring physician: Lowella Dandy, NP   HISTORY OF PRESENT ILLNESS:  The patient is a 64 y.o. female with anemia secondary to both iron deficiency and kidney disease.  In the past, IV Feraheme was very effective in replenishing her iron stores and normalizing her hemoglobin.  She comes in today to reassess her hemoglobin.  Since her last visit, the patient has been been doing fine.  She denies having any overt forms of blood loss which concern her for recurrent anemia.  PHYSICAL EXAM:  Blood pressure (!) 206/97, pulse 63, temperature 97.7 F (36.5 C), resp. rate 18, height $RemoveBe'5\' 4"'fzOOVHNIY$  (1.626 m), weight 297 lb 8 oz (134.9 kg), SpO2 96 %. Wt Readings from Last 3 Encounters:  12/01/19 297 lb 8 oz (134.9 kg)  11/19/19 299 lb (135.6 kg)  09/28/19 296 lb 3.2 oz (134.4 kg)   Body mass index is 51.07 kg/m. Performance status (ECOG): 2 Physical Exam Constitutional:      Appearance: Normal appearance. She is not ill-appearing.  HENT:     Mouth/Throat:     Mouth: Mucous membranes are moist.     Pharynx: Oropharynx is clear. No oropharyngeal exudate or posterior oropharyngeal erythema.  Cardiovascular:     Rate and Rhythm: Normal rate and regular rhythm.     Heart sounds: No murmur heard.  No friction rub. No gallop.   Pulmonary:     Effort: Pulmonary effort is normal. No respiratory distress.     Breath sounds: Normal breath sounds. No wheezing, rhonchi or rales.  Abdominal:     General: Bowel sounds are normal. There is no distension.     Palpations: Abdomen is soft. There is no mass.     Tenderness: There is no abdominal tenderness.  Musculoskeletal:        General: No swelling.     Right lower leg: No edema.     Left lower leg: No edema.  Lymphadenopathy:     Cervical: No cervical adenopathy.     Upper Body:     Right upper body: No supraclavicular or  axillary adenopathy.     Left upper body: No supraclavicular or axillary adenopathy.     Lower Body: No right inguinal adenopathy. No left inguinal adenopathy.  Skin:    General: Skin is warm.     Coloration: Skin is not jaundiced.     Findings: No lesion or rash.  Neurological:     General: No focal deficit present.     Mental Status: She is alert and oriented to person, place, and time. Mental status is at baseline.     Cranial Nerves: Cranial nerves are intact.  Psychiatric:        Mood and Affect: Mood normal.        Behavior: Behavior normal.        Thought Content: Thought content normal.     LABS:   CBC Latest Ref Rng & Units 12/01/2019 08/05/2018 06/17/2018  WBC - 7.1 7.1 5.2  Hemoglobin 12.0 - 16.0 13.2 10.6(L) 10.3(L)  Hematocrit 36 - 46 39 33.7(L) 32.6(L)  Platelets 150 - 399 249 241 188   CMP Latest Ref Rng & Units 12/01/2019 11/19/2019 06/18/2019  Glucose 65 - 99 mg/dL - 147(H) 136(H)  BUN 4 - 21 27(A) 23 22  Creatinine 0.5 - 1.1 1.5(A) 1.49(H) 1.45(H)  Sodium 137 - 147 137  135 136  Potassium 3.4 - 5.3 4.5 4.7 5.0  Chloride 99 - 108 102 98 98  CO2 13 - 22 26(A) 24 24  Calcium 8.7 - 10.7 9.0 9.3 9.2  Total Protein 6.0 - 8.5 g/dL - - -  Total Bilirubin 0.0 - 1.2 mg/dL - - -  Alkaline Phos 25 - 125 168(A) - -  AST 13 - 35 32 - -  ALT 7 - 35 19 - -    ASSESSMENT & PLAN:   Assessment/Plan:  A 64 y.o. female with anemia secondary to previous iron deficiency and renal insufficiency.  When evaluating her labs today, I am very pleased with her hemoglobin of 13.2.  Clinically, the patient appears to be doing well.  As that is the case, I will see her back in 6 months for repeat clinical assessment.  The patient understands all the plans discussed today and is in agreement with them.     Jeanclaude Wentworth Macarthur Critchley, MD

## 2019-12-01 ENCOUNTER — Other Ambulatory Visit: Payer: Self-pay | Admitting: Oncology

## 2019-12-01 ENCOUNTER — Telehealth: Payer: Self-pay | Admitting: Oncology

## 2019-12-01 ENCOUNTER — Inpatient Hospital Stay: Payer: Medicare Other | Attending: Oncology

## 2019-12-01 ENCOUNTER — Other Ambulatory Visit: Payer: Self-pay | Admitting: Hematology and Oncology

## 2019-12-01 ENCOUNTER — Inpatient Hospital Stay (INDEPENDENT_AMBULATORY_CARE_PROVIDER_SITE_OTHER): Payer: Medicare Other | Admitting: Oncology

## 2019-12-01 ENCOUNTER — Other Ambulatory Visit: Payer: Self-pay

## 2019-12-01 ENCOUNTER — Encounter: Payer: Self-pay | Admitting: Oncology

## 2019-12-01 VITALS — BP 206/97 | HR 63 | Temp 97.7°F | Resp 18 | Ht 64.0 in | Wt 297.5 lb

## 2019-12-01 DIAGNOSIS — D631 Anemia in chronic kidney disease: Secondary | ICD-10-CM

## 2019-12-01 DIAGNOSIS — N189 Chronic kidney disease, unspecified: Secondary | ICD-10-CM | POA: Diagnosis not present

## 2019-12-01 DIAGNOSIS — D649 Anemia, unspecified: Secondary | ICD-10-CM | POA: Diagnosis not present

## 2019-12-01 DIAGNOSIS — Z0001 Encounter for general adult medical examination with abnormal findings: Secondary | ICD-10-CM | POA: Diagnosis not present

## 2019-12-01 LAB — BASIC METABOLIC PANEL WITH GFR
BUN: 27 — AB (ref 4–21)
CO2: 26 — AB (ref 13–22)
Chloride: 102 (ref 99–108)
Creatinine: 1.5 — AB (ref 0.5–1.1)
Glucose: 160
Potassium: 4.5 (ref 3.4–5.3)
Sodium: 137 (ref 137–147)

## 2019-12-01 LAB — COMPREHENSIVE METABOLIC PANEL WITH GFR
Albumin: 4.2 (ref 3.5–5.0)
Calcium: 9 (ref 8.7–10.7)

## 2019-12-01 LAB — CBC AND DIFFERENTIAL
HCT: 39 (ref 36–46)
Hemoglobin: 13.2 (ref 12.0–16.0)
Neutrophils Absolute: 4.9
Platelets: 249 (ref 150–399)
WBC: 7.1

## 2019-12-01 LAB — IRON AND TIBC
Iron: 66 ug/dL (ref 28–170)
Saturation Ratios: 18 % (ref 10.4–31.8)
TIBC: 359 ug/dL (ref 250–450)
UIBC: 293 ug/dL

## 2019-12-01 LAB — CBC
MCV: 89 (ref 76–111)
RBC: 4.43 (ref 3.87–5.11)

## 2019-12-01 LAB — HEPATIC FUNCTION PANEL
ALT: 19 (ref 7–35)
AST: 32 (ref 13–35)
Alkaline Phosphatase: 168 — AB (ref 25–125)
Bilirubin, Total: 0.6

## 2019-12-01 LAB — FERRITIN: Ferritin: 84 ng/mL (ref 11–307)

## 2019-12-01 NOTE — Telephone Encounter (Signed)
Per 11/30 los -90mth f/u appt given to patient

## 2019-12-15 ENCOUNTER — Ambulatory Visit: Payer: Medicare Other | Admitting: Sports Medicine

## 2019-12-18 ENCOUNTER — Ambulatory Visit: Payer: Medicare Other | Admitting: Sports Medicine

## 2019-12-18 ENCOUNTER — Other Ambulatory Visit: Payer: Medicare Other

## 2019-12-21 ENCOUNTER — Other Ambulatory Visit: Payer: Medicare Other

## 2020-02-11 ENCOUNTER — Ambulatory Visit (INDEPENDENT_AMBULATORY_CARE_PROVIDER_SITE_OTHER): Payer: Medicare Other

## 2020-02-11 DIAGNOSIS — N1831 Chronic kidney disease, stage 3a: Secondary | ICD-10-CM | POA: Diagnosis not present

## 2020-02-11 DIAGNOSIS — M1990 Unspecified osteoarthritis, unspecified site: Secondary | ICD-10-CM | POA: Diagnosis not present

## 2020-02-11 DIAGNOSIS — D539 Nutritional anemia, unspecified: Secondary | ICD-10-CM | POA: Diagnosis not present

## 2020-02-11 DIAGNOSIS — E1122 Type 2 diabetes mellitus with diabetic chronic kidney disease: Secondary | ICD-10-CM | POA: Diagnosis not present

## 2020-02-11 DIAGNOSIS — E039 Hypothyroidism, unspecified: Secondary | ICD-10-CM | POA: Diagnosis not present

## 2020-02-11 DIAGNOSIS — I441 Atrioventricular block, second degree: Secondary | ICD-10-CM | POA: Diagnosis not present

## 2020-02-11 DIAGNOSIS — Z950 Presence of cardiac pacemaker: Secondary | ICD-10-CM | POA: Diagnosis not present

## 2020-02-11 DIAGNOSIS — E785 Hyperlipidemia, unspecified: Secondary | ICD-10-CM | POA: Diagnosis not present

## 2020-02-11 DIAGNOSIS — K219 Gastro-esophageal reflux disease without esophagitis: Secondary | ICD-10-CM | POA: Diagnosis not present

## 2020-02-11 DIAGNOSIS — I10 Essential (primary) hypertension: Secondary | ICD-10-CM | POA: Diagnosis not present

## 2020-02-11 DIAGNOSIS — N183 Chronic kidney disease, stage 3 unspecified: Secondary | ICD-10-CM | POA: Diagnosis not present

## 2020-02-11 DIAGNOSIS — Z6841 Body Mass Index (BMI) 40.0 and over, adult: Secondary | ICD-10-CM | POA: Diagnosis not present

## 2020-02-11 DIAGNOSIS — R0602 Shortness of breath: Secondary | ICD-10-CM | POA: Diagnosis not present

## 2020-02-11 LAB — CUP PACEART REMOTE DEVICE CHECK
Battery Remaining Longevity: 114 mo
Battery Remaining Percentage: 95.5 %
Battery Voltage: 3.02 V
Brady Statistic AP VP Percent: 1 %
Brady Statistic AP VS Percent: 22 %
Brady Statistic AS VP Percent: 17 %
Brady Statistic AS VS Percent: 60 %
Brady Statistic RA Percent Paced: 21 %
Brady Statistic RV Percent Paced: 18 %
Date Time Interrogation Session: 20220210020013
Implantable Lead Implant Date: 20200814
Implantable Lead Implant Date: 20200814
Implantable Lead Location: 753859
Implantable Lead Location: 753860
Implantable Pulse Generator Implant Date: 20200814
Lead Channel Impedance Value: 430 Ohm
Lead Channel Impedance Value: 440 Ohm
Lead Channel Pacing Threshold Amplitude: 0.5 V
Lead Channel Pacing Threshold Amplitude: 1.25 V
Lead Channel Pacing Threshold Pulse Width: 0.5 ms
Lead Channel Pacing Threshold Pulse Width: 0.5 ms
Lead Channel Sensing Intrinsic Amplitude: 1.4 mV
Lead Channel Sensing Intrinsic Amplitude: 7.8 mV
Lead Channel Setting Pacing Amplitude: 2.5 V
Lead Channel Setting Pacing Amplitude: 2.5 V
Lead Channel Setting Pacing Pulse Width: 0.5 ms
Lead Channel Setting Sensing Sensitivity: 2 mV
Pulse Gen Model: 2272
Pulse Gen Serial Number: 9154171

## 2020-02-17 NOTE — Progress Notes (Signed)
Remote pacemaker transmission.   

## 2020-02-18 ENCOUNTER — Telehealth: Payer: Self-pay | Admitting: Cardiology

## 2020-02-18 NOTE — Telephone Encounter (Signed)
The really proper way to evaluate his to have her pacemaker interrogated.  I did review her last interrogation which showed some evidence of PMT, so that ideally if she does not want to go to the emergency room we need to bring her to the office for EKG and pacemaker interrogation potential reprogramming.  If she does not want to do it please ask her to drink some extra fluid and she can temporarily increase dose of metoprolol.

## 2020-02-18 NOTE — Telephone Encounter (Signed)
Spoke to the patient just now. She reports that she has been having palpitations over the last two days. She states that it feels like her heart is jumping around. Today is the best she has felt but she can tell that her heart rate is still not right. Over the last two days she has been seeing "black spots" when she stands up and is nauseous/feels like she is going to pass out. She states that her HR is jumping from the 60-80 range up into the 120's and this is happening while she is sitting and standing. BP is 106/77 right now.  I advised at this time that she needs to be evaluated in the emergency room. She is very against doing this and states that she has no form of transportation and does not want to call an ambulance. She wants to know Dr. Wendy Poet recommendation from here.   I will route to him at this time.

## 2020-02-18 NOTE — Telephone Encounter (Signed)
Spoke to the patient just now and let her know Dr. Wendy Poet recommendations. She verbalizes understanding and states that she can get a ride early next week to come into the office for this. I scheduled her for Monday with Dr. Agustin Cree. She will increase her fluid intake over the next couple of days.   I will route back to Dr. Agustin Cree to see if he wants her to increase her dose of metoprolol before Monday or not.

## 2020-02-18 NOTE — Telephone Encounter (Signed)
Patient c/o Palpitations:  High priority if patient c/o lightheadedness, shortness of breath, or chest pain  1) How long have you had palpitations/irregular HR/ Afib? Are you having the symptoms now? Palpitations, yes  2) Are you currently experiencing lightheadedness, SOB or CP? Not right now but did experience this the last couple of days. States yesterday and the day before yesterday were horrible. Today has been better.  3) Do you have a history of afib (atrial fibrillation) or irregular heart rhythm? Irregular HR  4) Have you checked your BP or HR? (document readings if available): BP 106/77 HR 80 but it is jumping from 60-80's. States it did get up in the 120's a few times over the last few days. States it was while she was sitting and standing.  5) Are you experiencing any other symptoms? Nausea, states she feels bad when she stands up like she is going to pass out. Also reports feeling weak.

## 2020-02-22 ENCOUNTER — Other Ambulatory Visit: Payer: Self-pay

## 2020-02-22 ENCOUNTER — Encounter: Payer: Self-pay | Admitting: Cardiology

## 2020-02-22 ENCOUNTER — Ambulatory Visit (INDEPENDENT_AMBULATORY_CARE_PROVIDER_SITE_OTHER): Payer: Medicare Other | Admitting: Cardiology

## 2020-02-22 ENCOUNTER — Telehealth: Payer: Self-pay

## 2020-02-22 VITALS — BP 156/84 | HR 70 | Ht 63.0 in | Wt 302.0 lb

## 2020-02-22 DIAGNOSIS — R002 Palpitations: Secondary | ICD-10-CM | POA: Diagnosis not present

## 2020-02-22 DIAGNOSIS — Z950 Presence of cardiac pacemaker: Secondary | ICD-10-CM

## 2020-02-22 DIAGNOSIS — E119 Type 2 diabetes mellitus without complications: Secondary | ICD-10-CM | POA: Diagnosis not present

## 2020-02-22 DIAGNOSIS — I472 Ventricular tachycardia: Secondary | ICD-10-CM

## 2020-02-22 DIAGNOSIS — I10 Essential (primary) hypertension: Secondary | ICD-10-CM

## 2020-02-22 DIAGNOSIS — I4729 Other ventricular tachycardia: Secondary | ICD-10-CM

## 2020-02-22 NOTE — Progress Notes (Signed)
g

## 2020-02-22 NOTE — Progress Notes (Signed)
Cardiology Office Note:    Date:  02/22/2020   ID:  Vickie Ferguson [address], DOB [birth year], MRN [id number]  PCP:  Lowella Dandy, NP  Cardiologist:  Jenne Campus, MD    Referring MD: Lowella Dandy, NP   Chief Complaint  Patient presents with  . fluid retention  . Chest Pain    History of Present Illness:    Vickie Ferguson is a 65 y.o. female with past medical history significant for diastolic congestive heart failure, second-degree AV block to being addressed with [facility] pacemaker, essential hypertension, palpitations, PVC.  Last time I seen her she was complaining of having some increased ectopy I did increase dose of beta-blocker which seems to be helping however lately she noticed more weight gain as well as some fatigue tiredness and shortness of breath she went to her primary care physician apparently some blood tests were done she was told to have too much fluid she was given extra medication for getting rid of the fluid which resulted in palpitations she describes episodes when her heart was speeding up abrupt onset and abrupt offset make her feel very bad she did have some shortness of breath associated with this sensation.  She was told to stop that medication suspect that was Zaroxolyn.  And she was referred to [place].  At the moment of my interview she seems to be comfortable and she is doing well but she is scared of what ever happened.  Past Medical History:  Diagnosis Date  . Anemia   . Anginal pain ([place])   . Arthritis   . CKD (chronic kidney disease), stage III ([place])   . Complication of anesthesia   . Congestive heart failure (CHF) ([place]) 06/18/2019  . Diabetes mellitus without complication ([place])   . Essential hypertension 07/23/2014  . Family history of adverse reaction to anesthesia    " FAMILY MEMBERS HAD DIFFICULTY WAKING "  . Hypertension   . Hypothyroidism   . Iron deficiency anemia 07/23/2014  . Long term current use of insulin ([place]) 01/07/2017  . Morbid  (severe) obesity due to excess calories ([place]) 07/23/2014  . OSA (obstructive sleep apnea) 04/26/2017  . Other long term (current) drug therapy 01/07/2017  . Pacemaker [place] device 09/01/2018  . Palpitations 11/19/2019  . PONV (postoperative nausea and vomiting)   . Precordial pain 07/23/2014  . Presence of permanent cardiac pacemaker 08/15/2018   St Jude Medical Assurity MRI dual-chamber pacemaker for symptomatic bradycardia  . Pressure injury of skin 06/17/2018  . Second degree AV block   . Shortness of breath 04/26/2017  . TIA (transient ischemic attack) 04/26/2017  . Type 2 diabetes mellitus without complication ([place]) 0/03/7046  . Unstable angina ([place]) 06/17/2018  . Ventricular tachycardia (paroxysmal) ([place]) 07/23/2014    Past Surgical History:  Procedure Laterality Date  . CARDIAC CATHETERIZATION    . INSERT / REPLACE / REMOVE PACEMAKER  08/15/2018   St Jude Medical Assurity MRI dual-chamber pacemaker for symptomatic bradycardia  . LEFT HEART CATH AND CORONARY ANGIOGRAPHY N/A 06/17/2018   Procedure: LEFT HEART CATH AND CORONARY ANGIOGRAPHY;  Surgeon: Martinique, Peter M, MD;  Location: [place] [facility];  Service: Cardiovascular;  Laterality: N/A;  . PACEMAKER IMPLANT N/A 08/15/2018   Procedure: PACEMAKER IMPLANT;  Surgeon: Constance Haw, MD;  Location: [place] [facility];  Service: Cardiovascular;  Laterality: N/A;  . SHOULDER SURGERY Right 2012   METAL PLATE   . TOTAL KNEE ARTHROPLASTY Right 2001  Current Medications: Current Meds  Medication Sig  . acetaminophen (TYLENOL) 500 MG tablet Take 500 mg by mouth every 6 (six) hours as needed for mild pain or headache.  Marland Kitchen atorvastatin (LIPITOR) 40 MG tablet Take 1 tablet (40 mg total) by mouth at bedtime.  . clopidogrel (PLAVIX) 75 MG tablet Take 1 tablet (75 mg total) by mouth daily.  Marland Kitchen dexlansoprazole (DEXILANT) 60 MG capsule Take 60 mg by mouth daily.   . fluticasone (FLONASE) 50 MCG/ACT nasal spray Place 1 spray into both  nostrils daily as needed for allergies. For allergies  . furosemide (LASIX) 40 MG tablet Take 0.5-1 tablets (20-40 mg total) by mouth See admin instructions. Take 40mg  in the morning and 20mg  in the evening. If gains 2 pounds in three days then take an extra 20mg  in the evening one time.  Marland Kitchen levothyroxine (SYNTHROID, LEVOTHROID) 75 MCG tablet Take 75 mcg by mouth daily before breakfast.   . metFORMIN (GLUCOPHAGE) 500 MG tablet Take 500 mg by mouth 2 (two) times daily with a meal.  . metoprolol tartrate (LOPRESSOR) 25 MG tablet Take 1.5 tablets (37.5 mg total) by mouth 2 (two) times daily.  . nitroGLYCERIN (NITROSTAT) 0.4 MG SL tablet Place 1 tablet (0.4 mg total) under the tongue every 5 (five) minutes as needed. (Patient taking differently: Place 0.4 mg under the tongue every 5 (five) minutes as needed for chest pain.)  . Oxycodone HCl 10 MG TABS Take 1 tablet by mouth as needed (pain).  . polyvinyl alcohol (LIQUIFILM TEARS) 1.4 % ophthalmic solution Place 1 drop into both eyes as needed for dry eyes.     Allergies:   Aleve [naproxen sodium], Procainamide, Rythmol [propafenone hcl], Codeine, Doxycycline, Isosorbide nitrate, and Sertraline   Social History   Socioeconomic History  . Marital status: Legally Separated    Spouse name: Not on file  . Number of children: Not on file  . Years of education: Not on file  . Highest education level: Not on file  Occupational History  . Not on file  Tobacco Use  . Smoking status: Never Smoker  . Smokeless tobacco: Never Used  Vaping Use  . Vaping Use: Never used  Substance and Sexual Activity  . Alcohol use: No    Alcohol/week: 0.0 standard drinks  . Drug use: No  . Sexual activity: Not on file  Other Topics Concern  . Not on file  Social History Narrative  . Not on file   Social Determinants of Health   Financial Resource Strain: Not on file  Food Insecurity: Not on file  Transportation Needs: Not on file  Physical Activity: Not on  file  Stress: Not on file  Social Connections: Not on file     Family History: [name] family history includes Heart disease in her mother. ROS:   Please see the history of present illness.    All 14 point review of systems negative except as described per history of present illness  EKGs/Labs/Other Studies Reviewed:      Recent Labs: 06/18/2019: TSH 7.950 11/19/2019: Magnesium 2.2 12/01/2019: ALT 19; BUN 27; Creatinine 1.5; Hemoglobin 13.2; Platelets 249; Potassium 4.5; Sodium 137  Recent Lipid Panel No results found for: CHOL, TRIG, HDL, CHOLHDL, VLDL, LDLCALC, LDLDIRECT  Physical Exam:    VS:  BP (!) 156/84 (BP Location: Left Arm, Patient Position: Sitting)   Pulse 70   Ht 5\' 3"  (1.6 m)   Wt (!) 302 lb (137 kg)   SpO2 98%  BMI 53.50 kg/m     Wt Readings from Last 3 Encounters:  02/22/20 (!) 302 lb (137 kg)  12/01/19 297 lb 8 oz (134.9 kg)  11/19/19 299 lb (135.6 kg)     GEN:  Well nourished, well developed in no acute distress HEENT: Normal NECK: No JVD; No carotid bruits LYMPHATICS: No lymphadenopathy CARDIAC: RRR, no murmurs, no rubs, no gallops RESPIRATORY:  Clear to auscultation without rales, wheezing or rhonchi  ABDOMEN: Soft, non-tender, non-distended MUSCULOSKELETAL:  No edema; No deformity  SKIN: Warm and dry LOWER EXTREMITIES: no swelling NEUROLOGIC:  Alert and oriented x 3 PSYCHIATRIC:  Normal affect   ASSESSMENT:    1. Palpitations   2. Pacemaker [place] Jude device   3. Type 2 diabetes mellitus without complication, without long-term current use of insulin ([place])   4. Ventricular tachycardia (paroxysmal) ([place])   5. Essential hypertension    PLAN:    In order of problems listed above:  1. Palpitations need to be investigated.  EKG today showed paced rhythm, no abnormality.  I will ask her to have Chem-7 as well as proBNP done.  Also will check her magnesium as well as CBC. 2. Pacemaker present is a central device.  I did review  interrogation there was one documented episodes of pacemaker mediated tachycardia.  If we will not find explanation for palpitations or with electrolytes abnormality we may be forced to interrogate her device again to see if she got any more of pMTs. 3. Good weight gain and shortness of breath I will ask you to have repeated echocardiogram to recheck left ventricle ejection fraction. 4. Ventricular tachycardia given we will make arrangements for interrogation of her device to see if she got any evidence of ventricular tachycardia.   Medication Adjustments/Labs and Tests Ordered: Current medicines are reviewed at length with the patient today.  Concerns regarding medicines are outlined above.  No orders of the defined types were placed in this encounter.  Medication changes: No orders of the defined types were placed in this encounter.   Signed, Park Liter, MD, [facility] 02/22/2020 1:30 PM    [place]

## 2020-02-22 NOTE — Telephone Encounter (Signed)
-----   Message from Stanton Kidney, RN sent at 02/22/2020  2:22 PM EST -----  ----- Message ----- From: Park Liter, MD Sent: 02/22/2020   1:34 PM EST To: Stanton Kidney, RN  Judeen Hammans, do you mind to make arrangements for remote interrogation of this patient's device.  She came to me today complaining of having a lot of palpitations.  Also her interrogation of her device previously showed evidence of PMT. Thank you

## 2020-02-22 NOTE — Telephone Encounter (Signed)
Spoke with pt, assisted with remote transmission.  Presenting rhythm ASVP 96.  Device reflects 15 episodes of PMT occurring since 2/14. Rate response PVARP is off, current PVARP setting is 249ms, from episodes present it appears pt PVARP/ conduction is around 327ms.    Pt will need to come into the office for retrograde conduction testing programming.    All other device function is normal, pt has had <1% PVCs Sending to scheduling to contact pt for appt with APP.      PMT episode, Max track rate is 125

## 2020-02-22 NOTE — Patient Instructions (Signed)
Medication Instructions:  Your physician recommends that you continue on your current medications as directed. Please refer to the Current Medication list given to you today.  *If you need a refill on your cardiac medications before your next appointment, please call your pharmacy*   Lab Work: Your physician recommends that you return for lab work today: cmp, mg, cbc   If you have labs (blood work) drawn today and your tests are completely normal, you will receive your results only by: Marland Kitchen MyChart Message (if you have MyChart) OR . A paper copy in the mail If you have any lab test that is abnormal or we need to change your treatment, we will call you to review the results.   Testing/Procedures: None   Follow-Up: At [facility], you and your health needs are our priority.  As part of our continuing mission to provide you with exceptional heart care, we have created designated Provider Care Teams.  These Care Teams include your primary Cardiologist (physician) and Advanced Practice Providers (APPs -  Physician Assistants and Nurse Practitioners) who all work together to provide you with the care you need, when you need it.  We recommend signing up for the patient portal called "MyChart".  Sign up information is provided on this After Visit Summary.  MyChart is used to connect with patients for Virtual Visits (Telemedicine).  Patients are able to view lab/test results, encounter notes, upcoming appointments, etc.  Non-urgent messages can be sent to your provider as well.   To learn more about what you can do with MyChart, go to [email or web address].    Your next appointment:   3 week(s)  The format for your next appointment:   In Person  Provider:   Jenne Campus, MD   Other Instructions

## 2020-02-23 LAB — COMPREHENSIVE METABOLIC PANEL
ALT: 15 IU/L (ref 0–32)
AST: 27 IU/L (ref 0–40)
Albumin/Globulin Ratio: 1.2 (ref 1.2–2.2)
Albumin: 4.3 g/dL (ref 3.8–4.8)
Alkaline Phosphatase: 136 IU/L — ABNORMAL HIGH (ref 44–121)
BUN/Creatinine Ratio: 19 (ref 12–28)
BUN: 30 mg/dL — ABNORMAL HIGH (ref 8–27)
Bilirubin Total: 0.3 mg/dL (ref 0.0–1.2)
CO2: 18 mmol/L — ABNORMAL LOW (ref 20–29)
Calcium: 9.6 mg/dL (ref 8.7–10.3)
Chloride: 95 mmol/L — ABNORMAL LOW (ref 96–106)
Creatinine, Ser: 1.55 mg/dL — ABNORMAL HIGH (ref 0.57–1.00)
GFR calc Af Amer: 40 mL/min/{1.73_m2} — ABNORMAL LOW (ref >59)
GFR calc non Af Amer: 35 mL/min/{1.73_m2} — ABNORMAL LOW (ref >59)
Globulin, Total: 3.5 g/dL (ref 1.5–4.5)
Glucose: 171 mg/dL — ABNORMAL HIGH (ref 65–99)
Potassium: 4.2 mmol/L (ref 3.5–5.2)
Sodium: 136 mmol/L (ref 134–144)
Total Protein: 7.8 g/dL (ref 6.0–8.5)

## 2020-02-23 LAB — CBC
Hematocrit: 41.4 % (ref 34.0–46.6)
Hemoglobin: 13.4 g/dL (ref 11.1–15.9)
MCH: 29 pg (ref 26.6–33.0)
MCHC: 32.4 g/dL (ref 31.5–35.7)
MCV: 90 fL (ref 79–97)
Platelets: 298 10*3/uL (ref 150–450)
RBC: 4.62 x10E6/uL (ref 3.77–5.28)
RDW: 13.1 % (ref 11.7–15.4)
WBC: 9.2 10*3/uL (ref 3.4–10.8)

## 2020-02-23 LAB — MAGNESIUM: Magnesium: 2 mg/dL (ref 1.6–2.3)

## 2020-02-23 NOTE — Telephone Encounter (Signed)
Thank you Amy!  ................

## 2020-03-01 DIAGNOSIS — E669 Obesity, unspecified: Secondary | ICD-10-CM | POA: Diagnosis not present

## 2020-03-01 DIAGNOSIS — Z1331 Encounter for screening for depression: Secondary | ICD-10-CM | POA: Diagnosis not present

## 2020-03-01 DIAGNOSIS — Z0000 Encounter for general adult medical examination without abnormal findings: Secondary | ICD-10-CM | POA: Diagnosis not present

## 2020-03-01 DIAGNOSIS — E785 Hyperlipidemia, unspecified: Secondary | ICD-10-CM | POA: Diagnosis not present

## 2020-03-01 DIAGNOSIS — Z9181 History of falling: Secondary | ICD-10-CM | POA: Diagnosis not present

## 2020-03-06 NOTE — Progress Notes (Signed)
Electrophysiology Office Note   Date:  03/07/2020   ID:  Vickie Ferguson, Vickie Ferguson, MRN [id number]  PCP:  Lowella Dandy, NP  Cardiologist: Agustin Cree Primary Electrophysiologist:  Constance Haw, MD    No chief complaint on file.    History of Present Illness: Vickie Ferguson is a 65 y.o. female who is being seen today for the evaluation of heart block at the request of Moon, Amy A, NP. Presenting today for electrophysiology evaluation.    She has a history significant for hypertension, diabetes, obstructive sleep apnea.  She has had multiple episodes of bradycardia.  She wore a cardiac monitor that showed symptoms that correlated to 2-1 AV block.  She has now status post [place] dual-chamber pacemaker implanted 08/15/2018.  Today, denies symptoms of chest pain, shortness of breath, orthopnea, PND, lower extremity edema, claudication, dizziness, presyncope, syncope, bleeding, or neurologic sequela. The patient is tolerating medications without difficulties.  Over the past 3 weeks she has had significant palpitations.  Palpitations occur at random times without any inducing factors.  Reviewed device interrogation shows that she has a high burden of PMT over this time.   Past Medical History:  Diagnosis Date  . Anemia   . Anginal pain ([place])   . Arthritis   . CKD (chronic kidney disease), stage III ([place])   . Complication of anesthesia   . Congestive heart failure (CHF) ([place]) 06/18/2019  . Diabetes mellitus without complication ([place])   . Essential hypertension 07/23/2014  . Family history of adverse reaction to anesthesia    " FAMILY MEMBERS HAD DIFFICULTY WAKING "  . Hypertension   . Hypothyroidism   . Iron deficiency anemia 07/23/2014  . Long term current use of insulin ([place]) 01/07/2017  . Morbid (severe) obesity due to excess calories ([place]) 07/23/2014  . OSA (obstructive sleep apnea) 04/26/2017  . Other long term (current) drug therapy 01/07/2017  . Pacemaker  [place] device 09/01/2018  . Palpitations 11/19/2019  . PONV (postoperative nausea and vomiting)   . Precordial pain 07/23/2014  . Presence of permanent cardiac pacemaker 08/15/2018   St Jude Medical Assurity MRI dual-chamber pacemaker for symptomatic bradycardia  . Pressure injury of skin 06/17/2018  . Second degree AV block   . Shortness of breath 04/26/2017  . TIA (transient ischemic attack) 04/26/2017  . Type 2 diabetes mellitus without complication ([place]) 3/79/4327  . Unstable angina ([place]) 06/17/2018  . Ventricular tachycardia (paroxysmal) ([place]) 07/23/2014   Past Surgical History:  Procedure Laterality Date  . CARDIAC CATHETERIZATION    . INSERT / REPLACE / REMOVE PACEMAKER  08/15/2018   St Jude Medical Assurity MRI dual-chamber pacemaker for symptomatic bradycardia  . LEFT HEART CATH AND CORONARY ANGIOGRAPHY N/A 06/17/2018   Procedure: LEFT HEART CATH AND CORONARY ANGIOGRAPHY;  Surgeon: Martinique, Peter M, MD;  Location: [place] [facility];  Service: Cardiovascular;  Laterality: N/A;  . PACEMAKER IMPLANT N/A 08/15/2018   Procedure: PACEMAKER IMPLANT;  Surgeon: Constance Haw, MD;  Location: [facility];  Service: Cardiovascular;  Laterality: N/A;  . SHOULDER SURGERY Right 2012   METAL PLATE   . TOTAL KNEE ARTHROPLASTY Right 2001     Current Outpatient Medications  Medication Sig Dispense Refill  . acetaminophen (TYLENOL) 500 MG tablet Take 500 mg by mouth every 6 (six) hours as needed for mild pain or headache.    Marland Kitchen atorvastatin (LIPITOR) 40 MG tablet Take 1 tablet (40 mg total) by mouth at  bedtime. 90 tablet 2  . clopidogrel (PLAVIX) 75 MG tablet Take 1 tablet (75 mg total) by mouth daily. 90 tablet 2  . dexlansoprazole (DEXILANT) 60 MG capsule Take 60 mg by mouth daily.     . fluticasone (FLONASE) 50 MCG/ACT nasal spray Place 1 spray into both nostrils daily as needed for allergies. For allergies    . furosemide (LASIX) 40 MG tablet Take 0.5-1 tablets (20-40 mg total)  by mouth See admin instructions. Take 40mg  in the morning and 20mg  in the evening. If gains 2 pounds in three days then take an extra 20mg  in the evening one time. 90 tablet 3  . levothyroxine (SYNTHROID, LEVOTHROID) 75 MCG tablet Take 75 mcg by mouth daily before breakfast.     . metFORMIN (GLUCOPHAGE) 500 MG tablet Take 500 mg by mouth 2 (two) times daily with a meal.    . metoprolol tartrate (LOPRESSOR) 25 MG tablet Take 1.5 tablets (37.5 mg total) by mouth 2 (two) times daily. 270 tablet 2  . nitroGLYCERIN (NITROSTAT) 0.4 MG SL tablet Place 1 tablet (0.4 mg total) under the tongue every 5 (five) minutes as needed. (Patient taking differently: Place 0.4 mg under the tongue every 5 (five) minutes as needed for chest pain.) 75 tablet 2  . Oxycodone HCl 10 MG TABS Take 1 tablet by mouth as needed (pain).    . polyvinyl alcohol (LIQUIFILM TEARS) 1.4 % ophthalmic solution Place 1 drop into both eyes as needed for dry eyes.     No current facility-administered medications for this visit.    Allergies:   Aleve [naproxen sodium], Procainamide, Rythmol [propafenone hcl], Codeine, Doxycycline, Isosorbide nitrate, and Sertraline   Social History:  The patient  reports that she has never smoked. She has never used smokeless tobacco. She reports that she does not drink alcohol and does not use drugs.   Family History:  [name] family history includes Heart disease in her mother.   ROS:  Please see the history of present illness.   Otherwise, review of systems is positive for none.   All other systems are reviewed and negative.   PHYSICAL EXAM: VS:  BP 110/86   Pulse 68   Ht 5\' 3"  (1.6 m)   Wt (!) 302 lb (137 kg)   SpO2 98%   BMI 53.50 kg/m  , BMI Body mass index is 53.5 kg/m. GEN: Well nourished, well developed, in no acute distress  HEENT: normal  Neck: no JVD, carotid bruits, or masses Cardiac: RRR; no murmurs, rubs, or gallops,no edema  Respiratory:  clear to auscultation bilaterally,  normal work of breathing GI: soft, nontender, nondistended, + BS MS: no deformity or atrophy  Skin: warm and dry, device site well healed Neuro:  Strength and sensation are intact Psych: euthymic mood, full affect  EKG:  EKG is not ordered today. Personal review of the ekg ordered 02/22/20 shows atrial sensed, ventricular paced  Personal review of the device interrogation today. Results in [place]: 06/18/2019: TSH 7.950 02/22/2020: ALT 15; BUN 30; Creatinine, Ser 1.55; Hemoglobin 13.4; Magnesium 2.0; Platelets 298; Potassium 4.2; Sodium 136    Lipid Panel  No results found for: CHOL, TRIG, HDL, CHOLHDL, VLDL, LDLCALC, LDLDIRECT   Wt Readings from Last 3 Encounters:  03/07/20 (!) 302 lb (137 kg)  02/22/20 (!) 302 lb (137 kg)  12/01/19 297 lb 8 oz (134.9 kg)      Other studies Reviewed: Additional studies/ records that were reviewed today include:  TTE 06/10/18  Review of the above records today demonstrates:   1. The left ventricle has normal systolic function, with an ejection fraction of 55-60%. The cavity size was normal. Left ventricular diastolic function could not be evaluated.  2. The right ventricle has normal systolic function. The cavity was normal. There is no increase in right ventricular wall thickness.  3. Mitral valve regurgitation is mild to moderate by color flow Doppler.  4. The tricuspid valve is grossly normal.  5. The aortic valve is grossly normal. Moderate sclerosis of the aortic valve. Aortic valve regurgitation is trivial by color flow Doppler.  LHC 2/37/62  LV end diastolic pressure is normal.   1. Normal coronary anatomy 2. Normal LVEDP  Cardiac monitor 07/13/2018 personally reviewed Baseline rhythm: Sinus rhythm with intraventricular conduction delay Minimum heart rate: 38 BPM.  Average heart rate: 66 BPM.  Maximal heart rate 92 BPM. Conduction abnormality: Multiple episodes of 2-1 AV conduction, as well as second-degree type II AV  block.  Symptoms: Multiple symptomatology many times related to AV block.   ASSESSMENT AND PLAN:  1.  Second-degree AV block: Status post [place] dual-chamber pacemaker implanted 08/15/2018.  Device functioning appropriately.  Over the past 3 weeks she has been having palpitations.  This correlates to PMT noted on her device.  We have changed her PVARP.  She Vickie Ferguson call back if she continues to have palpitations.  2.  Hypertension: Currently well controlled  Current medicines are reviewed at length with the patient today.   The patient does not have concerns regarding her medicines.  The following changes were made today: none  Labs/ tests ordered today include:  No orders of the defined types were placed in this encounter.   Disposition:   FU with Sachit Gilman 12 months  Signed, Jacey Pelc Meredith Leeds, MD  03/07/2020 11:47 AM     [facility] [address] [place] [address] [phone] (office) [phone] (fax)

## 2020-03-07 ENCOUNTER — Other Ambulatory Visit: Payer: Self-pay

## 2020-03-07 ENCOUNTER — Encounter: Payer: Self-pay | Admitting: Cardiology

## 2020-03-07 ENCOUNTER — Ambulatory Visit (INDEPENDENT_AMBULATORY_CARE_PROVIDER_SITE_OTHER): Payer: Medicare Other | Admitting: Cardiology

## 2020-03-07 VITALS — BP 110/86 | HR 68 | Ht 63.0 in | Wt 302.0 lb

## 2020-03-07 DIAGNOSIS — I441 Atrioventricular block, second degree: Secondary | ICD-10-CM

## 2020-03-07 NOTE — Patient Instructions (Signed)
Medication Instructions:  Your physician recommends that you continue on your current medications as directed. Please refer to the Current Medication list given to you today.  *If you need a refill on your cardiac medications before your next appointment, please call your pharmacy*   Lab Work: None ordered  Testing/Procedures: None ordered   Follow-Up: At [facility], you and your health needs are our priority.  As part of our continuing mission to provide you with exceptional heart care, we have created designated Provider Care Teams.  These Care Teams include your primary Cardiologist (physician) and Advanced Practice Providers (APPs -  Physician Assistants and Nurse Practitioners) who all work together to provide you with the care you need, when you need it.  Remote monitoring is used to monitor your Pacemaker or ICD from home. This monitoring reduces the number of office visits required to check your device to one time per year. It allows [place] to keep an eye on the functioning of your device to ensure it is working properly. You are scheduled for a device check from home on 05/12/2020. You may send your transmission at any time that day. If you have a wireless device, the transmission will be sent automatically. After your physician reviews your transmission, you will receive a postcard with your next transmission date.  Your next appointment:   1 year(s)  The format for your next appointment:   In Person  Provider:   Allegra Lai, MD   Thank you for choosing [facility]!!   Trinidad Curet, RN [phone]    Other Instructions

## 2020-03-10 ENCOUNTER — Other Ambulatory Visit: Payer: Self-pay

## 2020-03-10 ENCOUNTER — Ambulatory Visit (INDEPENDENT_AMBULATORY_CARE_PROVIDER_SITE_OTHER): Payer: Medicare Other

## 2020-03-10 DIAGNOSIS — I441 Atrioventricular block, second degree: Secondary | ICD-10-CM | POA: Diagnosis not present

## 2020-03-10 DIAGNOSIS — R002 Palpitations: Secondary | ICD-10-CM | POA: Diagnosis not present

## 2020-03-10 LAB — ECHOCARDIOGRAM COMPLETE
AR max vel: 1.1 cm2
AV Area VTI: 0.87 cm2
AV Area mean vel: 1 cm2
AV Mean grad: 16.5 mmHg
AV Peak grad: 25.9 mmHg
Ao pk vel: 2.55 m/s
Area-P 1/2: 2.21 cm2
Calc EF: 47 %
S' Lateral: 3.3 cm
Single Plane A2C EF: 51.9 %
Single Plane A4C EF: 47.2 %

## 2020-03-10 NOTE — Progress Notes (Signed)
Complete echocardiogram performed.  Jimmy Lain Tetterton RDCS, RVT  

## 2020-03-14 ENCOUNTER — Other Ambulatory Visit: Payer: Self-pay | Admitting: Cardiology

## 2020-03-14 ENCOUNTER — Telehealth: Payer: Self-pay | Admitting: Emergency Medicine

## 2020-03-14 DIAGNOSIS — I5032 Chronic diastolic (congestive) heart failure: Secondary | ICD-10-CM

## 2020-03-14 NOTE — Telephone Encounter (Signed)
Called patient informed her of results. She will come this week for labs. No further questions.

## 2020-03-14 NOTE — Telephone Encounter (Signed)
-----   Message from Park Liter, MD sent at 03/11/2020  1:16 PM EST ----- Echocardiogram showed diminished left ventricle ejection fraction 40 to 45%, it was normal last time in 2020, also showed aortic stenosis but I do not think aortic stenosis critical.  Please ask her to have Chem-7 done as well as proBNP this we will decide about therapy

## 2020-03-15 DIAGNOSIS — I5032 Chronic diastolic (congestive) heart failure: Secondary | ICD-10-CM | POA: Diagnosis not present

## 2020-03-16 LAB — BASIC METABOLIC PANEL
BUN/Creatinine Ratio: 22 (ref 12–28)
BUN: 34 mg/dL — ABNORMAL HIGH (ref 8–27)
CO2: 22 mmol/L (ref 20–29)
Calcium: 9.3 mg/dL (ref 8.7–10.3)
Chloride: 94 mmol/L — ABNORMAL LOW (ref 96–106)
Creatinine, Ser: 1.58 mg/dL — ABNORMAL HIGH (ref 0.57–1.00)
Glucose: 164 mg/dL — ABNORMAL HIGH (ref 65–99)
Potassium: 4.9 mmol/L (ref 3.5–5.2)
Sodium: 135 mmol/L (ref 134–144)
eGFR: 36 mL/min/{1.73_m2} — ABNORMAL LOW (ref >59)

## 2020-03-16 LAB — PRO B NATRIURETIC PEPTIDE: NT-Pro BNP: 542 pg/mL — ABNORMAL HIGH (ref 0–301)

## 2020-03-21 ENCOUNTER — Ambulatory Visit (INDEPENDENT_AMBULATORY_CARE_PROVIDER_SITE_OTHER): Payer: Medicare Other | Admitting: Cardiology

## 2020-03-21 ENCOUNTER — Encounter: Payer: Self-pay | Admitting: Cardiology

## 2020-03-21 ENCOUNTER — Other Ambulatory Visit: Payer: Self-pay

## 2020-03-21 VITALS — BP 144/92 | HR 63 | Ht 64.0 in | Wt 309.0 lb

## 2020-03-21 DIAGNOSIS — I10 Essential (primary) hypertension: Secondary | ICD-10-CM | POA: Diagnosis not present

## 2020-03-21 DIAGNOSIS — I5032 Chronic diastolic (congestive) heart failure: Secondary | ICD-10-CM | POA: Diagnosis not present

## 2020-03-21 DIAGNOSIS — Z950 Presence of cardiac pacemaker: Secondary | ICD-10-CM | POA: Diagnosis not present

## 2020-03-21 DIAGNOSIS — I350 Nonrheumatic aortic (valve) stenosis: Secondary | ICD-10-CM | POA: Insufficient documentation

## 2020-03-21 DIAGNOSIS — I472 Ventricular tachycardia: Secondary | ICD-10-CM

## 2020-03-21 DIAGNOSIS — Z79899 Other long term (current) drug therapy: Secondary | ICD-10-CM | POA: Diagnosis not present

## 2020-03-21 DIAGNOSIS — I4729 Other ventricular tachycardia: Secondary | ICD-10-CM

## 2020-03-21 HISTORY — DX: Nonrheumatic aortic (valve) stenosis: I35.0

## 2020-03-21 LAB — BASIC METABOLIC PANEL
BUN/Creatinine Ratio: 17 (ref 12–28)
BUN: 29 mg/dL — ABNORMAL HIGH (ref 8–27)
CO2: 23 mmol/L (ref 20–29)
Calcium: 9.2 mg/dL (ref 8.7–10.3)
Chloride: 97 mmol/L (ref 96–106)
Creatinine, Ser: 1.69 mg/dL — ABNORMAL HIGH (ref 0.57–1.00)
Glucose: 176 mg/dL — ABNORMAL HIGH (ref 65–99)
Potassium: 5.1 mmol/L (ref 3.5–5.2)
Sodium: 137 mmol/L (ref 134–144)
eGFR: 33 mL/min/{1.73_m2} — ABNORMAL LOW (ref >59)

## 2020-03-21 MED ORDER — METOPROLOL TARTRATE 50 MG PO TABS: 50.0000 mg | ORAL_TABLET | Freq: Two times a day (BID) | ORAL | 1 refills | Status: DC

## 2020-03-21 NOTE — Patient Instructions (Signed)
Medication Instructions:  Your physician has recommended you make the following change in your medication:   INCREASE: Metoprolol to 50 mg twice daily  *If you need a refill on your cardiac medications before your next appointment, please call your pharmacy*   Lab Work: Your physician recommends that you return for lab work today: bmp   If you have labs (blood work) drawn today and your tests are completely normal, you will receive your results only by: Marland Kitchen MyChart Message (if you have MyChart) OR . A paper copy in the mail If you have any lab test that is abnormal or we need to change your treatment, we will call you to review the results.   Testing/Procedures: None   Follow-Up: At [facility], you and your health needs are our priority.  As part of our continuing mission to provide you with exceptional heart care, we have created designated Provider Care Teams.  These Care Teams include your primary Cardiologist (physician) and Advanced Practice Providers (APPs -  Physician Assistants and Nurse Practitioners) who all work together to provide you with the care you need, when you need it.  We recommend signing up for the patient portal called "MyChart".  Sign up information is provided on this After Visit Summary.  MyChart is used to connect with patients for Virtual Visits (Telemedicine).  Patients are able to view lab/test results, encounter notes, upcoming appointments, etc.  Non-urgent messages can be sent to your provider as well.   To learn more about what you can do with MyChart, go to [email or web address].    Your next appointment:   1 month(s)  The format for your next appointment:   In Person  Provider:   Jenne Campus, MD   Other Instructions

## 2020-03-21 NOTE — Progress Notes (Signed)
Cardiology Office Note:    Date:  03/21/2020   ID:  Vickie Ferguson Duty [address], DOB [birth year], MRN [id number]  PCP:  Lowella Dandy, NP  Cardiologist:  Jenne Campus, MD    Referring MD: Lowella Dandy, NP   Chief Complaint  Patient presents with  . Shortness of Breath  . Fatigue  . Results    History of Present Illness:    Vickie Ferguson is a 65 y.o. female with past medical history significant for diastolic congestive heart rate, last echocardiogram showed however diminished ejection fraction 40 to 45%, congestive heart failure, second-degree AV block and that being addressed with pacemaker, essential hypertension, palpitation, PVCs.  Comes today to my office for follow-up.  Still complain of being tired exhausted having some shortness of breath.  I we have been trying to manage her fluid status with diuretic with limited response on top of that her creatinine was going up.  She still complain of having some palpitations but overall seems to be doing better.  Past Medical History:  Diagnosis Date  . Anemia   . Anginal pain ([place])   . Arthritis   . CKD (chronic kidney disease), stage III ([place])   . Complication of anesthesia   . Congestive heart failure (CHF) ([place]) 06/18/2019  . Diabetes mellitus without complication ([place])   . Essential hypertension 07/23/2014  . Family history of adverse reaction to anesthesia    " FAMILY MEMBERS HAD DIFFICULTY WAKING "  . Hypertension   . Hypothyroidism   . Iron deficiency anemia 07/23/2014  . Long term current use of insulin ([place]) 01/07/2017  . Morbid (severe) obesity due to excess calories ([place]) 07/23/2014  . OSA (obstructive sleep apnea) 04/26/2017  . Other long term (current) drug therapy 01/07/2017  . Pacemaker [place] device 09/01/2018  . Palpitations 11/19/2019  . PONV (postoperative nausea and vomiting)   . Precordial pain 07/23/2014  . Presence of permanent cardiac pacemaker 08/15/2018   St Jude Medical Assurity MRI dual-chamber  pacemaker for symptomatic bradycardia  . Pressure injury of skin 06/17/2018  . Second degree AV block   . Shortness of breath 04/26/2017  . TIA (transient ischemic attack) 04/26/2017  . Type 2 diabetes mellitus without complication ([place]) 7/68/0881  . Unstable angina ([place]) 06/17/2018  . Ventricular tachycardia (paroxysmal) ([place]) 07/23/2014    Past Surgical History:  Procedure Laterality Date  . CARDIAC CATHETERIZATION    . INSERT / REPLACE / REMOVE PACEMAKER  08/15/2018   St Jude Medical Assurity MRI dual-chamber pacemaker for symptomatic bradycardia  . LEFT HEART CATH AND CORONARY ANGIOGRAPHY N/A 06/17/2018   Procedure: LEFT HEART CATH AND CORONARY ANGIOGRAPHY;  Surgeon: Martinique, Peter M, MD;  Location: [facility];  Service: Cardiovascular;  Laterality: N/A;  . PACEMAKER IMPLANT N/A 08/15/2018   Procedure: PACEMAKER IMPLANT;  Surgeon: Constance Haw, MD;  Location: [facility];  Service: Cardiovascular;  Laterality: N/A;  . SHOULDER SURGERY Right 2012   METAL PLATE   . TOTAL KNEE ARTHROPLASTY Right 2001    Current Medications: Current Meds  Medication Sig  . acetaminophen (TYLENOL) 500 MG tablet Take 500 mg by mouth every 6 (six) hours as needed for mild pain or headache.  Marland Kitchen atorvastatin (LIPITOR) 40 MG tablet TAKE 1 TABLET BY MOUTH AT BEDTIME.  . clopidogrel (PLAVIX) 75 MG tablet Take 1 tablet (75 mg total) by mouth daily.  Marland Kitchen dexlansoprazole (DEXILANT) 60 MG capsule Take 60 mg by mouth daily.   Marland Kitchen  fluticasone (FLONASE) 50 MCG/ACT nasal spray Place 1 spray into both nostrils daily as needed for allergies. For allergies  . furosemide (LASIX) 40 MG tablet Take 0.5-1 tablets (20-40 mg total) by mouth See admin instructions. Take 40mg  in the morning and 20mg  in the evening. If gains 2 pounds in three days then take an extra 20mg  in the evening one time.  Marland Kitchen levothyroxine (SYNTHROID, LEVOTHROID) 75 MCG tablet Take 75 mcg by mouth daily before breakfast.   . metFORMIN  (GLUCOPHAGE) 500 MG tablet Take 500 mg by mouth 2 (two) times daily with a meal.  . nitroGLYCERIN (NITROSTAT) 0.4 MG SL tablet Place 1 tablet (0.4 mg total) under the tongue every 5 (five) minutes as needed. (Patient taking differently: Place 0.4 mg under the tongue every 5 (five) minutes as needed for chest pain.)  . Oxycodone HCl 10 MG TABS Take 1 tablet by mouth as needed (pain).  . polyvinyl alcohol (LIQUIFILM TEARS) 1.4 % ophthalmic solution Place 1 drop into both eyes as needed for dry eyes.  . [DISCONTINUED] metoprolol tartrate (LOPRESSOR) 25 MG tablet Take 1.5 tablets (37.5 mg total) by mouth 2 (two) times daily.     Allergies:   Aleve [naproxen sodium], Procainamide, Rythmol [propafenone hcl], Codeine, Doxycycline, Isosorbide nitrate, and Sertraline   Social History   Socioeconomic History  . Marital status: Legally Separated    Spouse name: Not on file  . Number of children: Not on file  . Years of education: Not on file  . Highest education level: Not on file  Occupational History  . Not on file  Tobacco Use  . Smoking status: Never Smoker  . Smokeless tobacco: Never Used  Vaping Use  . Vaping Use: Never used  Substance and Sexual Activity  . Alcohol use: No    Alcohol/week: 0.0 standard drinks  . Drug use: No  . Sexual activity: Not on file  Other Topics Concern  . Not on file  Social History Narrative  . Not on file   Social Determinants of Health   Financial Resource Strain: Not on file  Food Insecurity: Not on file  Transportation Needs: Not on file  Physical Activity: Not on file  Stress: Not on file  Social Connections: Not on file     Family History: [name] family history includes Heart disease in her mother. ROS:   Please see the history of present illness.    All 14 point review of systems negative except as described per history of present illness  EKGs/Labs/Other Studies Reviewed:      Recent Labs: 06/18/2019: TSH 7.950 02/22/2020:  ALT 15; Hemoglobin 13.4; Magnesium 2.0; Platelets 298 03/15/2020: BUN 34; Creatinine, Ser 1.58; NT-Pro BNP 542; Potassium 4.9; Sodium 135  Recent Lipid Panel No results found for: CHOL, TRIG, HDL, CHOLHDL, VLDL, LDLCALC, LDLDIRECT  Physical Exam:    VS:  BP (!) 144/92 (BP Location: Left Arm, Patient Position: Sitting)   Pulse 63   Ht 5\' 4"  (1.626 m)   Wt (!) 309 lb (140.2 kg)   SpO2 98%   BMI 53.04 kg/m     Wt Readings from Last 3 Encounters:  03/21/20 (!) 309 lb (140.2 kg)  03/07/20 (!) 302 lb (137 kg)  02/22/20 (!) 302 lb (137 kg)     GEN:  Well nourished, well developed in no acute distress HEENT: Normal NECK: No JVD; No carotid bruits LYMPHATICS: No lymphadenopathy CARDIAC: RRR, systolic ejection murmur grade 1/6 to 2/6 best heard right upper portion of  the sternum, no rubs, no gallops RESPIRATORY:  Clear to auscultation without rales, wheezing or rhonchi  ABDOMEN: Soft, non-tender, non-distended MUSCULOSKELETAL:  No edema; No deformity  SKIN: Warm and dry LOWER EXTREMITIES: no swelling NEUROLOGIC:  Alert and oriented x 3 PSYCHIATRIC:  Normal affect   ASSESSMENT:    1. Medication management   2. Chronic diastolic congestive heart failure ([place])   3. Pacemaker [place] Jude device   4. Essential hypertension   5. Ventricular tachycardia (paroxysmal) ([place])   6. Nonrheumatic aortic valve stenosis    PLAN:    In order of problems listed above:  1. Chronic diastolic congestive heart failure now with comfort of systolic congestive heart failure.  Not a candidate for ARB ACE inhibitor or Entresto secondary to kidney dysfunction.  I will check Chem-7 today hopefully will be able to increase dose of diuretic.  In the meantime we will increase dose of beta-blocker to 50 mg twice daily of metoprolol. 2. Essential hypertension elevated today but she tells me that her blood pressure can be low at home.  I told her to take only half dose of t Lasix if her blood pressure systolic is  less than 504. 3. Nonrheumatic aortic valve stenosis only moderate.  We will continue monitoring. 4. History of ventricular tachycardia she does have a device pacemaker did not show any new issues.   Medication Adjustments/Labs and Tests Ordered: Current medicines are reviewed at length with the patient today.  Concerns regarding medicines are outlined above.  Orders Placed This Encounter  Procedures  . Basic metabolic panel   Medication changes:  Meds ordered this encounter  Medications  . metoprolol tartrate (LOPRESSOR) 50 MG tablet    Sig: Take 1 tablet (50 mg total) by mouth 2 (two) times daily.    Dispense:  180 tablet    Refill:  1    Signed, Park Liter, MD, [facility] 03/21/2020 10:14 AM    [place]

## 2020-03-22 ENCOUNTER — Telehealth: Payer: Self-pay | Admitting: Cardiology

## 2020-03-22 MED ORDER — METOPROLOL TARTRATE 50 MG PO TABS: 50.0000 mg | ORAL_TABLET | Freq: Two times a day (BID) | ORAL | 1 refills | Status: DC

## 2020-03-22 NOTE — Telephone Encounter (Signed)
*  STAT* If patient is at the pharmacy, call can be transferred to refill team.   1. Which medications need to be refilled? (please list name of each medication and dose if known) metoprolol tartrate (LOPRESSOR) 50 MG tablet  2. Which pharmacy/location (including street and city if local pharmacy) is medication to be sent to? [place], [place]  3. Do they need a 30 day or 90 day supply? 90 day  Patient states pharmacy has not received prescription

## 2020-03-22 NOTE — Telephone Encounter (Signed)
Refill sent in per request.  

## 2020-03-25 DIAGNOSIS — E113293 Type 2 diabetes mellitus with mild nonproliferative diabetic retinopathy without macular edema, bilateral: Secondary | ICD-10-CM | POA: Diagnosis not present

## 2020-04-19 DIAGNOSIS — D649 Anemia, unspecified: Secondary | ICD-10-CM | POA: Insufficient documentation

## 2020-04-22 ENCOUNTER — Encounter: Payer: Self-pay | Admitting: Cardiology

## 2020-04-22 ENCOUNTER — Other Ambulatory Visit: Payer: Self-pay

## 2020-04-22 ENCOUNTER — Ambulatory Visit (INDEPENDENT_AMBULATORY_CARE_PROVIDER_SITE_OTHER): Payer: Medicare Other | Admitting: Cardiology

## 2020-04-22 VITALS — BP 142/78 | HR 77 | Ht 64.0 in | Wt 309.0 lb

## 2020-04-22 DIAGNOSIS — Z950 Presence of cardiac pacemaker: Secondary | ICD-10-CM

## 2020-04-22 DIAGNOSIS — I441 Atrioventricular block, second degree: Secondary | ICD-10-CM

## 2020-04-22 DIAGNOSIS — I5032 Chronic diastolic (congestive) heart failure: Secondary | ICD-10-CM

## 2020-04-22 DIAGNOSIS — I4729 Other ventricular tachycardia: Secondary | ICD-10-CM

## 2020-04-22 DIAGNOSIS — I472 Ventricular tachycardia: Secondary | ICD-10-CM

## 2020-04-22 NOTE — Progress Notes (Signed)
Cardiology Office Note:    Date:  04/22/2020   ID:  Rudolpho Sevin Ferguson [address], DOB Dec 21, 1955, MRN [id number]  PCP:  Vickie Dandy, NP  Cardiologist:  Vickie Campus, MD    Referring MD: Vickie Dandy, NP   Chief Complaint  Patient presents with  . Follow-up  . lab work   I am doing better  History of Present Illness:    Vickie Ferguson is a 65 y.o. female with past medical history significant for diastolic as well as systolic congestive heart failure, last ejection fraction 4045% based on echo from March 2022, secondary degree AV block required permanent pacemaker which is [facility] Jude device, essential hypertension, palpitations, PVCs.  She also have aortic stenosis with latest assessment based on echocardiogram showing mean gradient of 16.5, arctic valve area 0.87 cm, her stroke-volume index was low only 23, dimensionless index was 0.28.  I did see her last time about a month ago she was not doing well she had palpitation she was complaining of having some shortness of breath I did check her proBNP which is mildly elevated but at stable level, I also gave her slightly higher dose of beta-blocker to suppress her palpitations and that seems to be helping.  She tells me today she feels better palpitations are improved still she is complaining about high fluctuation of her weight about 5pounds I told her I we can try a little higher dose of diuretics however she does not want to try it because she is afraid of her kidney function which likely has been stable lately.  Past Medical History:  Diagnosis Date  . Anemia   . Anginal pain ([place])   . Aortic stenosis 03/21/2020  . Arthritis   . CKD (chronic kidney disease), stage III ([place])   . Complication of anesthesia   . Congestive heart failure (CHF) ([place]) 06/18/2019  . Diabetes mellitus without complication ([place])   . Essential hypertension 07/23/2014  . Family history of adverse reaction to anesthesia    " FAMILY MEMBERS HAD DIFFICULTY WAKING "   . Hypertension   . Hypothyroidism   . Iron deficiency anemia 07/23/2014  . Long term current use of insulin ([place]) 01/07/2017  . Morbid (severe) obesity due to excess calories ([place]) 07/23/2014  . OSA (obstructive sleep apnea) 04/26/2017  . Other long term (current) drug therapy 01/07/2017  . Pacemaker [place] device 09/01/2018  . Palpitations 11/19/2019  . PONV (postoperative nausea and vomiting)   . Precordial pain 07/23/2014  . Presence of permanent cardiac pacemaker 08/15/2018   St Jude Medical Assurity MRI dual-chamber pacemaker for symptomatic bradycardia  . Pressure injury of skin 06/17/2018  . Second degree AV block   . Shortness of breath 04/26/2017  . TIA (transient ischemic attack) 04/26/2017  . Type 2 diabetes mellitus without complication ([place]) 9/77/4142  . Unstable angina ([place]) 06/17/2018  . Ventricular tachycardia (paroxysmal) ([place]) 07/23/2014    Past Surgical History:  Procedure Laterality Date  . CARDIAC CATHETERIZATION    . INSERT / REPLACE / REMOVE PACEMAKER  08/15/2018   St Jude Medical Assurity MRI dual-chamber pacemaker for symptomatic bradycardia  . LEFT HEART CATH AND CORONARY ANGIOGRAPHY N/A 06/17/2018   Procedure: LEFT HEART CATH AND CORONARY ANGIOGRAPHY;  Surgeon: Martinique, Peter M, MD;  Location: [facility];  Service: Cardiovascular;  Laterality: N/A;  . PACEMAKER IMPLANT N/A 08/15/2018   Procedure: PACEMAKER IMPLANT;  Surgeon: Constance Haw, MD;  Location: [facility];  Service:  Cardiovascular;  Laterality: N/A;  . SHOULDER SURGERY Right 2012   METAL PLATE   . TOTAL KNEE ARTHROPLASTY Right 2001    Current Medications: Current Meds  Medication Sig  . acetaminophen (TYLENOL) 500 MG tablet Take 500 mg by mouth every 6 (six) hours as needed for mild pain or headache.  Marland Kitchen atorvastatin (LIPITOR) 40 MG tablet TAKE 1 TABLET BY MOUTH AT BEDTIME. (Patient taking differently: Take 40 mg by mouth daily.)  . clopidogrel (PLAVIX) 75 MG tablet Take 1  tablet (75 mg total) by mouth daily.  Marland Kitchen dexlansoprazole (DEXILANT) 60 MG capsule Take 60 mg by mouth daily.   . fluticasone (FLONASE) 50 MCG/ACT nasal spray Place 1 spray into both nostrils daily as needed for allergies. For allergies  . furosemide (LASIX) 40 MG tablet Take 0.5-1 tablets (20-40 mg total) by mouth See admin instructions. Take 40mg  in the morning and 20mg  in the evening. If gains 2 pounds in three days then take an extra 20mg  in the evening one time. (Patient taking differently: Take 40 mg by mouth 2 (two) times daily. Take 40mg  in the morning and 20mg  in the evening. If gains 2 pounds in three days then take an extra 20mg  in the evening one time.)  . levothyroxine (SYNTHROID, LEVOTHROID) 75 MCG tablet Take 75 mcg by mouth daily before breakfast.   . metFORMIN (GLUCOPHAGE) 500 MG tablet Take 500 mg by mouth 2 (two) times daily with a meal.  . metoprolol tartrate (LOPRESSOR) 50 MG tablet Take 1 tablet (50 mg total) by mouth 2 (two) times daily.  . nitroGLYCERIN (NITROSTAT) 0.4 MG SL tablet Place 1 tablet (0.4 mg total) under the tongue every 5 (five) minutes as needed. (Patient taking differently: Place 0.4 mg under the tongue every 5 (five) minutes as needed for chest pain.)  . Oxycodone HCl 10 MG TABS Take 1 tablet by mouth as needed (pain).  . polyvinyl alcohol (LIQUIFILM TEARS) 1.4 % ophthalmic solution Place 1 drop into both eyes as needed for dry eyes.     Allergies:   Aleve [naproxen sodium], Procainamide, Rythmol [propafenone hcl], Codeine, Doxycycline, Isosorbide nitrate, and Sertraline   Social History   Socioeconomic History  . Marital status: Legally Separated    Spouse name: Not on file  . Number of children: Not on file  . Years of education: Not on file  . Highest education level: Not on file  Occupational History  . Not on file  Tobacco Use  . Smoking status: Never Smoker  . Smokeless tobacco: Never Used  Vaping Use  . Vaping Use: Never used  Substance and  Sexual Activity  . Alcohol use: No    Alcohol/week: 0.0 standard drinks  . Drug use: No  . Sexual activity: Not on file  Other Topics Concern  . Not on file  Social History Narrative  . Not on file   Social Determinants of Health   Financial Resource Strain: Not on file  Food Insecurity: Not on file  Transportation Needs: Not on file  Physical Activity: Not on file  Stress: Not on file  Social Connections: Not on file     Family History: [name] family history includes Heart disease in her mother. ROS:   Please see the history of present illness.    All 14 point review of systems negative except as described per history of present illness  EKGs/Labs/Other Studies Reviewed:      Recent Labs: 06/18/2019: TSH 7.950 02/22/2020: ALT 15; Hemoglobin 13.4; Magnesium  2.0; Platelets 298 03/15/2020: NT-Pro BNP 542 03/21/2020: BUN 29; Creatinine, Ser 1.69; Potassium 5.1; Sodium 137  Recent Lipid Panel No results found for: CHOL, TRIG, HDL, CHOLHDL, VLDL, LDLCALC, LDLDIRECT  Physical Exam:    VS:  BP (!) 142/78 (BP Location: Right Arm, Patient Position: Standing)   Pulse 77   Ht $R'5\' 4"'FN$  (1.626 m)   Wt (!) 309 lb (140.2 kg)   SpO2 96%   BMI 53.04 kg/m     Wt Readings from Last 3 Encounters:  04/22/20 (!) 309 lb (140.2 kg)  03/21/20 (!) 309 lb (140.2 kg)  03/07/20 (!) 302 lb (137 kg)     GEN:  Well nourished, well developed in no acute distress HEENT: Normal NECK: No JVD; No carotid bruits LYMPHATICS: No lymphadenopathy CARDIAC: RRR, systolic ejection murmur grade 3/6 best heard right upper portion of the sternum s, no rubs, no gallops RESPIRATORY:  Clear to auscultation without rales, wheezing or rhonchi  ABDOMEN: Soft, non-tender, non-distended MUSCULOSKELETAL:  No edema; No deformity  SKIN: Warm and dry LOWER EXTREMITIES: no swelling NEUROLOGIC:  Alert and oriented x 3 PSYCHIATRIC:  Normal affect   ASSESSMENT:    1. Chronic diastolic congestive heart failure  ([place])   2. Second degree AV block   3. Ventricular tachycardia (paroxysmal) ([place])   4. Pacemaker [place] Jude device    PLAN:    In order of problems listed above:  1. Chronic diastolic and systolic congestive heart failure.  She seems to be stabilizing now and I will continue present management. 2. Second-degree AV block that being addressed with pacemaker.  Device function properly it is a central device and I did review interrogation of the device. 3. Ventricular tachycardia none lately. 4. Aortic stenosis which reaching the point of significance however I do not think with there yet.  Her mean gradient is only 18 and dimensional index is still in the moderate range.  The true is that calculated aortic valve area is low and stroke-volume is low as well but overall I would consider her rather poor candidate for intervention because of her morbid obesity as well as some kidney dysfunction.  Therefore for at least now I would continue monitoring her.  In about 3 months I will see her again and see how she does   Medication Adjustments/Labs and Tests Ordered: Current medicines are reviewed at length with the patient today.  Concerns regarding medicines are outlined above.  No orders of the defined types were placed in this encounter.  Medication changes: No orders of the defined types were placed in this encounter.   Signed, Park Liter, MD, [facility] 04/22/2020 10:10 AM    [place]

## 2020-04-22 NOTE — Patient Instructions (Signed)
Medication Instructions:  No medication changes. *If you need a refill on your cardiac medications before your next appointment, please call your pharmacy*   Lab Work: None ordered If you have labs (blood work) drawn today and your tests are completely normal, you will receive your results only by: Marland Kitchen MyChart Message (if you have MyChart) OR . A paper copy in the mail If you have any lab test that is abnormal or we need to change your treatment, we will call you to review the results.   Testing/Procedures: None ordered   Follow-Up: At [facility], you and your health needs are our priority.  As part of our continuing mission to provide you with exceptional heart care, we have created designated Provider Care Teams.  These Care Teams include your primary Cardiologist (physician) and Advanced Practice Providers (APPs -  Physician Assistants and Nurse Practitioners) who all work together to provide you with the care you need, when you need it.  We recommend signing up for the patient portal called "MyChart".  Sign up information is provided on this After Visit Summary.  MyChart is used to connect with patients for Virtual Visits (Telemedicine).  Patients are able to view lab/test results, encounter notes, upcoming appointments, etc.  Non-urgent messages can be sent to your provider as well.   To learn more about what you can do with MyChart, go to [email or web address].    Your next appointment:   3 month(s)  The format for your next appointment:   In Person  Provider:   Jenne Campus, MD   Other Instructions NA

## 2020-05-09 DIAGNOSIS — D509 Iron deficiency anemia, unspecified: Secondary | ICD-10-CM | POA: Diagnosis not present

## 2020-05-09 DIAGNOSIS — R1310 Dysphagia, unspecified: Secondary | ICD-10-CM | POA: Diagnosis not present

## 2020-05-11 DIAGNOSIS — R1310 Dysphagia, unspecified: Secondary | ICD-10-CM | POA: Diagnosis not present

## 2020-05-12 ENCOUNTER — Ambulatory Visit (INDEPENDENT_AMBULATORY_CARE_PROVIDER_SITE_OTHER): Payer: Medicare Other

## 2020-05-12 DIAGNOSIS — I441 Atrioventricular block, second degree: Secondary | ICD-10-CM | POA: Diagnosis not present

## 2020-05-12 LAB — CUP PACEART REMOTE DEVICE CHECK
Battery Remaining Longevity: 107 mo
Battery Remaining Percentage: 95.5 %
Battery Voltage: 3.02 V
Brady Statistic AP VP Percent: 2.2 %
Brady Statistic AP VS Percent: 36 %
Brady Statistic AS VP Percent: 33 %
Brady Statistic AS VS Percent: 29 %
Brady Statistic RA Percent Paced: 37 %
Brady Statistic RV Percent Paced: 36 %
Date Time Interrogation Session: 20220512020014
Implantable Lead Implant Date: 20200814
Implantable Lead Implant Date: 20200814
Implantable Lead Location: 753859
Implantable Lead Location: 753860
Implantable Pulse Generator Implant Date: 20200814
Lead Channel Impedance Value: 390 Ohm
Lead Channel Impedance Value: 440 Ohm
Lead Channel Pacing Threshold Amplitude: 0.5 V
Lead Channel Pacing Threshold Amplitude: 1.25 V
Lead Channel Pacing Threshold Pulse Width: 0.5 ms
Lead Channel Pacing Threshold Pulse Width: 0.5 ms
Lead Channel Sensing Intrinsic Amplitude: 1.4 mV
Lead Channel Sensing Intrinsic Amplitude: 7.8 mV
Lead Channel Setting Pacing Amplitude: 2.5 V
Lead Channel Setting Pacing Amplitude: 2.5 V
Lead Channel Setting Pacing Pulse Width: 0.5 ms
Lead Channel Setting Sensing Sensitivity: 2 mV
Pulse Gen Model: 2272
Pulse Gen Serial Number: 9154171

## 2020-05-25 ENCOUNTER — Telehealth: Payer: Self-pay | Admitting: Cardiology

## 2020-05-25 NOTE — Telephone Encounter (Signed)
STAT if HR is under 50 or over 120 (normal HR is 60-100 beats per minute)  1) What is your heart rate? 102   2) Do you have a log of your heart rate readings (document readings)? from 109-127  3) Do you have any other symptoms? heartrate speeding up and slowing down

## 2020-05-25 NOTE — Telephone Encounter (Signed)
Spoke with pt.  Assisted with manual transmission.    Pt is having frequent episodes of PMT.  Advised her we need her to come into the office for reprogramming.  She states she is not able to come to [place] due to transportation.  She would be willing to come to [facility] if we can arrange appropriate transportation that does not cost her extra and she can have someone accompany her for the trip.    Patient is symptomatic when PMT occurs, she is having chest pain  (Rated 5-6/10), SOB and overall feels bad.  She tried taking an extra Metroprolol.

## 2020-05-25 NOTE — Telephone Encounter (Signed)
Per device clinic: She is having PMT the 5/12 transmission showed 41 episodes of it Her base rate is 60 and max track rate is 125. She is probably having retrograde conduction resulting in PMT.   Advised to send in transmission She needs guidance on how to send.  Aware I will forward to device clinic for someone to call and explain how to send. Aware once received will be reviewed by device clinic RN and forwarded to Dr. Curt Bears for advisement. Patient verbalized understanding and agreeable to plan.

## 2020-05-26 DIAGNOSIS — M1990 Unspecified osteoarthritis, unspecified site: Secondary | ICD-10-CM | POA: Diagnosis not present

## 2020-05-26 DIAGNOSIS — E1122 Type 2 diabetes mellitus with diabetic chronic kidney disease: Secondary | ICD-10-CM | POA: Diagnosis not present

## 2020-05-26 DIAGNOSIS — D539 Nutritional anemia, unspecified: Secondary | ICD-10-CM | POA: Diagnosis not present

## 2020-05-26 DIAGNOSIS — N1831 Chronic kidney disease, stage 3a: Secondary | ICD-10-CM | POA: Diagnosis not present

## 2020-05-26 DIAGNOSIS — Z950 Presence of cardiac pacemaker: Secondary | ICD-10-CM | POA: Diagnosis not present

## 2020-05-26 DIAGNOSIS — N183 Chronic kidney disease, stage 3 unspecified: Secondary | ICD-10-CM | POA: Diagnosis not present

## 2020-05-26 DIAGNOSIS — E039 Hypothyroidism, unspecified: Secondary | ICD-10-CM | POA: Diagnosis not present

## 2020-05-26 DIAGNOSIS — I10 Essential (primary) hypertension: Secondary | ICD-10-CM | POA: Diagnosis not present

## 2020-05-26 DIAGNOSIS — R600 Localized edema: Secondary | ICD-10-CM | POA: Diagnosis not present

## 2020-05-26 DIAGNOSIS — K219 Gastro-esophageal reflux disease without esophagitis: Secondary | ICD-10-CM | POA: Diagnosis not present

## 2020-05-26 DIAGNOSIS — E785 Hyperlipidemia, unspecified: Secondary | ICD-10-CM | POA: Diagnosis not present

## 2020-05-26 DIAGNOSIS — Z6841 Body Mass Index (BMI) 40.0 and over, adult: Secondary | ICD-10-CM | POA: Diagnosis not present

## 2020-05-31 ENCOUNTER — Other Ambulatory Visit: Payer: Self-pay

## 2020-05-31 ENCOUNTER — Telehealth: Payer: Self-pay

## 2020-05-31 ENCOUNTER — Encounter: Payer: Self-pay | Admitting: Cardiology

## 2020-05-31 ENCOUNTER — Ambulatory Visit (INDEPENDENT_AMBULATORY_CARE_PROVIDER_SITE_OTHER): Payer: Medicare Other | Admitting: Cardiology

## 2020-05-31 VITALS — BP 136/80 | HR 83 | Ht 64.0 in | Wt 308.0 lb

## 2020-05-31 DIAGNOSIS — R002 Palpitations: Secondary | ICD-10-CM

## 2020-05-31 DIAGNOSIS — I499 Cardiac arrhythmia, unspecified: Secondary | ICD-10-CM

## 2020-05-31 DIAGNOSIS — I350 Nonrheumatic aortic (valve) stenosis: Secondary | ICD-10-CM | POA: Diagnosis not present

## 2020-05-31 DIAGNOSIS — I10 Essential (primary) hypertension: Secondary | ICD-10-CM | POA: Diagnosis not present

## 2020-05-31 DIAGNOSIS — E119 Type 2 diabetes mellitus without complications: Secondary | ICD-10-CM

## 2020-05-31 HISTORY — DX: Cardiac arrhythmia, unspecified: I49.9

## 2020-05-31 NOTE — Patient Instructions (Signed)

## 2020-05-31 NOTE — Telephone Encounter (Signed)
   Name: Vickie Ferguson  DOB: 01-10-1955  MRN: [id number]   Primary Cardiologist: Jenne Campus, MD  Chart reviewed as part of pre-operative protocol coverage.   Patient has a PMH of VT, high-grade AV block s/p PPM, chronic combined CHF, moderate aortic stenosis, HTN, HLD DM type 2, and questionable TIA history in 2019. It appears she has been on plavix since at least 2017 from my chart review. Cardiac catheterization 06/2018 showed normal coronary arteries.   Dr. Drue Dun - any objections to holding plavix prior to her upcoming colonoscopy? Do you recall why this medications was initiated or if it is still needed? Please route your response back to P CV DIV PREOP. Thank you!     Abigail Butts, PA-C 05/31/2020, 5:23 PM

## 2020-05-31 NOTE — Telephone Encounter (Signed)
   [facility] Pre-operative Risk Assessment    Request for surgical clearance:  1. What type of surgery is being performed? EGD/dilation   2. When is this surgery scheduled? 06/21/2020   3. What type of clearance is required (medical clearance vs. Pharmacy clearance to hold med vs. Both)? Pharmacy  4. Are there any medications that need to be held prior to surgery and how long?Pavix(stop on 06/16,2022)   5. Practice name and name of physician performing surgery? Dr. Kyra Leyland at [facility]   6. What is your office phone number: [phone]    7.   What is your office fax number: [phone]  8.   Anesthesia type (None, local, MAC, general) ? Not specified   Basil Dess Kyara Boxer 05/31/2020, 4:34 PM  _________________________________________________________________   (provider comments below)

## 2020-05-31 NOTE — Progress Notes (Signed)
Cardiology Office Note:    Date:  05/31/2020   ID:  Vickie Ferguson, DOB 06/04/1955, MRN [id number]  PCP:  Lowella Dandy, NP  Cardiologist:  Jenne Campus, MD    Referring MD: Lowella Dandy, NP   Chief Complaint  Patient presents with  . pace maker issues    Tachycardia  I am having tachycardia  History of Present Illness:    Vickie Ferguson is a 65 y.o. female with past medical history significant for essential pacemaker which is dual-chamber, aortic stenosis which appears to be moderate, cardiomyopathy with ejection fraction of 40 to 45% based on echocardiogram from March 2022, congestive heart failure, ventricular ectopy. She been noted to have frequent palpitations interrogation of pacemaker showed multiple episodes of PMT.  She is brought today to the office to talk about this.  Overall she does not feel well when she has palpitations she will feel her heart speeding up making her feel very uncomfortable.  Denies have any chest pain tightness squeezing pressure burning with the sensation, there is no dizziness times no passing out.  Past Medical History:  Diagnosis Date  . Anemia   . Anginal pain ([place])   . Aortic stenosis 03/21/2020  . Arthritis   . CKD (chronic kidney disease), stage III ([place])   . Complication of anesthesia   . Congestive heart failure (CHF) ([place]) 06/18/2019  . Diabetes mellitus without complication ([place])   . Essential hypertension 07/23/2014  . Family history of adverse reaction to anesthesia    " FAMILY MEMBERS HAD DIFFICULTY WAKING "  . Hypertension   . Hypothyroidism   . Iron deficiency anemia 07/23/2014  . Long term current use of insulin ([place]) 01/07/2017  . Morbid (severe) obesity due to excess calories ([place]) 07/23/2014  . OSA (obstructive sleep apnea) 04/26/2017  . Other long term (current) drug therapy 01/07/2017  . Pacemaker [place] device 09/01/2018  . Palpitations 11/19/2019  . PONV (postoperative nausea and vomiting)   . Precordial  pain 07/23/2014  . Presence of permanent cardiac pacemaker 08/15/2018   St Jude Medical Assurity MRI dual-chamber pacemaker for symptomatic bradycardia  . Pressure injury of skin 06/17/2018  . Second degree AV block   . Shortness of breath 04/26/2017  . TIA (transient ischemic attack) 04/26/2017  . Type 2 diabetes mellitus without complication ([place]) 9/47/6546  . Unstable angina ([place]) 06/17/2018  . Ventricular tachycardia (paroxysmal) ([place]) 07/23/2014    Past Surgical History:  Procedure Laterality Date  . CARDIAC CATHETERIZATION    . INSERT / REPLACE / REMOVE PACEMAKER  08/15/2018   St Jude Medical Assurity MRI dual-chamber pacemaker for symptomatic bradycardia  . LEFT HEART CATH AND CORONARY ANGIOGRAPHY N/A 06/17/2018   Procedure: LEFT HEART CATH AND CORONARY ANGIOGRAPHY;  Surgeon: Martinique, Peter M, MD;  Location: [facility];  Service: Cardiovascular;  Laterality: N/A;  . PACEMAKER IMPLANT N/A 08/15/2018   Procedure: PACEMAKER IMPLANT;  Surgeon: Constance Haw, MD;  Location: [facility];  Service: Cardiovascular;  Laterality: N/A;  . SHOULDER SURGERY Right 2012   METAL PLATE   . TOTAL KNEE ARTHROPLASTY Right 2001    Current Medications: Current Meds  Medication Sig  . acetaminophen (TYLENOL) 500 MG tablet Take 500 mg by mouth every 6 (six) hours as needed for mild pain or headache.  Marland Kitchen atorvastatin (LIPITOR) 40 MG tablet TAKE 1 TABLET BY MOUTH AT BEDTIME. (Patient taking differently: Take 40 mg by mouth daily.)  . clopidogrel (PLAVIX)  75 MG tablet Take 1 tablet (75 mg total) by mouth daily.  Marland Kitchen dexlansoprazole (DEXILANT) 60 MG capsule Take 60 mg by mouth daily.   . fluticasone (FLONASE) 50 MCG/ACT nasal spray Place 1 spray into both nostrils daily as needed for allergies. For allergies  . furosemide (LASIX) 40 MG tablet Take 0.5-1 tablets (20-40 mg total) by mouth See admin instructions. Take 40mg  in the morning and 20mg  in the evening. If gains 2 pounds in three days  then take an extra 20mg  in the evening one time. (Patient taking differently: Take 40 mg by mouth 2 (two) times daily. Take 40mg  in the morning and 20mg  in the evening. If gains 2 pounds in three days then take an extra 20mg  in the evening one time.)  . levothyroxine (SYNTHROID, LEVOTHROID) 75 MCG tablet Take 75 mcg by mouth daily before breakfast.   . metFORMIN (GLUCOPHAGE) 500 MG tablet Take 500 mg by mouth 2 (two) times daily with a meal.  . metoprolol tartrate (LOPRESSOR) 50 MG tablet Take 1 tablet (50 mg total) by mouth 2 (two) times daily.  . nitroGLYCERIN (NITROSTAT) 0.4 MG SL tablet Place 1 tablet (0.4 mg total) under the tongue every 5 (five) minutes as needed. (Patient taking differently: Place 0.4 mg under the tongue every 5 (five) minutes as needed for chest pain.)  . Oxycodone HCl 10 MG TABS Take 1 tablet by mouth as needed (pain).  . polyvinyl alcohol (LIQUIFILM TEARS) 1.4 % ophthalmic solution Place 1 drop into both eyes as needed for dry eyes.     Allergies:   Aleve [naproxen sodium], Procainamide, Rythmol [propafenone hcl], Codeine, Doxycycline, Isosorbide nitrate, and Sertraline   Social History   Socioeconomic History  . Marital status: Legally Separated    Spouse name: Not on file  . Number of children: Not on file  . Years of education: Not on file  . Highest education level: Not on file  Occupational History  . Not on file  Tobacco Use  . Smoking status: Never Smoker  . Smokeless tobacco: Never Used  Vaping Use  . Vaping Use: Never used  Substance and Sexual Activity  . Alcohol use: No    Alcohol/week: 0.0 standard drinks  . Drug use: No  . Sexual activity: Not on file  Other Topics Concern  . Not on file  Social History Narrative  . Not on file   Social Determinants of Health   Financial Resource Strain: Not on file  Food Insecurity: Not on file  Transportation Needs: Not on file  Physical Activity: Not on file  Stress: Not on file  Social  Connections: Not on file     Family History: [name] family history includes Heart disease in her mother. ROS:   Please see the history of present illness.    All 14 point review of systems negative except as described per history of present illness  EKGs/Labs/Other Studies Reviewed:      Recent Labs: 06/18/2019: TSH 7.950 02/22/2020: ALT 15; Hemoglobin 13.4; Magnesium 2.0; Platelets 298 03/15/2020: NT-Pro BNP 542 03/21/2020: BUN 29; Creatinine, Ser 1.69; Potassium 5.1; Sodium 137  Recent Lipid Panel No results found for: CHOL, TRIG, HDL, CHOLHDL, VLDL, LDLCALC, LDLDIRECT  Physical Exam:    VS:  BP 136/80 (BP Location: Right Arm, Patient Position: Sitting)   Pulse 83   Ht 5\' 4"  (1.626 m)   Wt (!) 308 lb (139.7 kg)   SpO2 96%   BMI 52.87 kg/m     Wt  Readings from Last 3 Encounters:  05/31/20 (!) 308 lb (139.7 kg)  04/22/20 (!) 309 lb (140.2 kg)  03/21/20 (!) 309 lb (140.2 kg)     GEN:  Well nourished, well developed in no acute distress HEENT: Normal NECK: No JVD; No carotid bruits LYMPHATICS: No lymphadenopathy CARDIAC: RRR, no murmurs, no rubs, no gallops RESPIRATORY:  Clear to auscultation without rales, wheezing or rhonchi  ABDOMEN: Soft, non-tender, non-distended MUSCULOSKELETAL:  No edema; No deformity  SKIN: Warm and dry LOWER EXTREMITIES: no swelling NEUROLOGIC:  Alert and oriented x 3 PSYCHIATRIC:  Normal affect   ASSESSMENT:    1. Nonrheumatic aortic valve stenosis   2. Pacemaker-mediated tachycardia   3. Type 2 diabetes mellitus without complication, without long-term current use of insulin (HCC)   4. Palpitations   5. Essential hypertension    PLAN:    In order of problems listed above:  1. Pacemaker mediated tachycardia, device has been interrogated, 2. Changes has been made.  She did have a right adjusted PVARP, we turned the function off.  We left PVARP 375 ms which reduced from maximum tracking rate to 100 bpm.  However Izora Gala overall  need for sedentary lifestyle I do think it will be significant factor for her goal right now is to suppress her pacemaker mediated tachycardia.  3. Congestive heart failure on physical exam she appears to be compensated.  We will continue present management. 4. Type 2 diabetes, I did review her K PN which show me data from May 26, 2020 with hemoglobin A1c of 7.0. 5. Dyslipidemia, I did review her K PN show me data from 26 May 2020 with LDL of 69 HDL 57 there is a good cholesterol profile she is on Lipitor 40 which is high intense statin which I will continue.   Medication Adjustments/Labs and Tests Ordered: Current medicines are reviewed at length with the patient today.  Concerns regarding medicines are outlined above.  No orders of the defined types were placed in this encounter.  Medication changes: No orders of the defined types were placed in this encounter.   Signed, Park Liter, MD, [facility] 05/31/2020 1:20 PM    [facility]

## 2020-06-01 ENCOUNTER — Other Ambulatory Visit: Payer: Medicare Other

## 2020-06-01 ENCOUNTER — Ambulatory Visit: Payer: Medicare Other | Admitting: Oncology

## 2020-06-02 NOTE — Telephone Encounter (Signed)
    Kathyann Spaugh Duty Oswaldo Conroy DOB:  05-17-1955  MRN:  [id number]   Primary Cardiologist: Jenne Campus, MD  Chart reviewed as part of pre-operative protocol coverage. Given past medical history and time since last visit, based on ACC/AHA guidelines, Denea Cheaney Duty Chinchilla would be at acceptable risk for the planned procedure without further cardiovascular testing. She was last seen 05/31/20 by Dr. Agustin Cree doing overall well with some episodes of PMT (pacemaker mediated tachycardia). Adjustments were made to her pacemaker settings.   Per Dr. Agustin Cree, her Plavix may be held for the planned procedure.   I will route this recommendation to the requesting party via Epic fax function and remove from pre-op pool.  Please call with questions.  Loel Dubonnet, NP 06/02/2020, 8:49 AM

## 2020-06-03 NOTE — Progress Notes (Signed)
Remote pacemaker transmission.   

## 2020-06-21 DIAGNOSIS — I129 Hypertensive chronic kidney disease with stage 1 through stage 4 chronic kidney disease, or unspecified chronic kidney disease: Secondary | ICD-10-CM | POA: Diagnosis not present

## 2020-06-21 DIAGNOSIS — K449 Diaphragmatic hernia without obstruction or gangrene: Secondary | ICD-10-CM | POA: Diagnosis not present

## 2020-06-21 DIAGNOSIS — G4733 Obstructive sleep apnea (adult) (pediatric): Secondary | ICD-10-CM | POA: Diagnosis not present

## 2020-06-21 DIAGNOSIS — E1122 Type 2 diabetes mellitus with diabetic chronic kidney disease: Secondary | ICD-10-CM | POA: Diagnosis not present

## 2020-06-21 DIAGNOSIS — Z7984 Long term (current) use of oral hypoglycemic drugs: Secondary | ICD-10-CM | POA: Diagnosis not present

## 2020-06-21 DIAGNOSIS — D631 Anemia in chronic kidney disease: Secondary | ICD-10-CM | POA: Diagnosis not present

## 2020-06-21 DIAGNOSIS — Z8379 Family history of other diseases of the digestive system: Secondary | ICD-10-CM | POA: Diagnosis not present

## 2020-06-21 DIAGNOSIS — N189 Chronic kidney disease, unspecified: Secondary | ICD-10-CM | POA: Diagnosis not present

## 2020-06-21 DIAGNOSIS — Z803 Family history of malignant neoplasm of breast: Secondary | ICD-10-CM | POA: Diagnosis not present

## 2020-06-21 DIAGNOSIS — Z950 Presence of cardiac pacemaker: Secondary | ICD-10-CM | POA: Diagnosis not present

## 2020-06-21 DIAGNOSIS — K219 Gastro-esophageal reflux disease without esophagitis: Secondary | ICD-10-CM | POA: Diagnosis not present

## 2020-06-21 DIAGNOSIS — R1310 Dysphagia, unspecified: Secondary | ICD-10-CM | POA: Diagnosis not present

## 2020-06-21 DIAGNOSIS — K222 Esophageal obstruction: Secondary | ICD-10-CM | POA: Diagnosis not present

## 2020-06-27 NOTE — Progress Notes (Signed)
[facility]  [address] [place],  [place]  [address] [phone]  Clinic Day:  06/29/2020  Referring physician: Lowella Dandy, NP  This document serves as a record of services personally performed by Marice Potter, MD. It was created on their behalf by Curry,Lauren E, a trained medical scribe. The creation of this record is based on the scribe's personal observations and the provider's statements to them.  HISTORY OF PRESENT ILLNESS:  The patient is a 65 y.o. female with anemia secondary to both iron deficiency and kidney disease.  In the past, IV Feraheme was very effective in replenishing her iron stores and normalizing her hemoglobin.  She comes in today to reassess her hemoglobin.  Since her last visit, the patient has been been doing fine.  She denies having any overt forms of blood loss which concern her for recurrent anemia.  PHYSICAL EXAM:  Blood pressure (!) 186/80, pulse 66, temperature 98.7 F (37.1 C), resp. rate 16, height $RemoveBe'5\' 4"'PveaERKEc$  (1.626 m), weight (!) 309 lb 12.8 oz (140.5 kg), SpO2 96 %. Wt Readings from Last 3 Encounters:  06/29/20 (!) 309 lb 12.8 oz (140.5 kg)  05/31/20 (!) 308 lb (139.7 kg)  04/22/20 (!) 309 lb (140.2 kg)   Body mass index is 53.18 kg/m. Performance status (ECOG): 2 Physical Exam Constitutional:      Appearance: Normal appearance. She is not ill-appearing.  HENT:     Mouth/Throat:     Mouth: Mucous membranes are moist.     Pharynx: Oropharynx is clear. No oropharyngeal exudate or posterior oropharyngeal erythema.  Cardiovascular:     Rate and Rhythm: Normal rate and regular rhythm.     Heart sounds: No murmur heard.   No friction rub. No gallop.  Pulmonary:     Effort: Pulmonary effort is normal. No respiratory distress.     Breath sounds: Normal breath sounds. No wheezing, rhonchi or rales.  Chest:  Breasts:    Right: No axillary adenopathy or supraclavicular adenopathy.     Left: No axillary adenopathy  or supraclavicular adenopathy.  Abdominal:     General: Bowel sounds are normal. There is no distension.     Palpations: Abdomen is soft. There is no mass.     Tenderness: There is no abdominal tenderness.  Musculoskeletal:        General: No swelling.     Right lower leg: No edema.     Left lower leg: No edema.  Lymphadenopathy:     Cervical: No cervical adenopathy.     Upper Body:     Right upper body: No supraclavicular or axillary adenopathy.     Left upper body: No supraclavicular or axillary adenopathy.     Lower Body: No right inguinal adenopathy. No left inguinal adenopathy.  Skin:    General: Skin is warm.     Coloration: Skin is not jaundiced.     Findings: No lesion or rash.  Neurological:     General: No focal deficit present.     Mental Status: She is alert and oriented to person, place, and time. Mental status is at baseline.     Cranial Nerves: Cranial nerves are intact.  Psychiatric:        Mood and Affect: Mood normal.        Behavior: Behavior normal.        Thought Content: Thought content normal.    LABS:   CBC Latest Ref Rng & Units 06/29/2020 02/22/2020 12/01/2019  WBC - 8.5 9.2 7.1  Hemoglobin 12.0 - 16.0 13.2 13.4 13.2  Hematocrit 36 - 46 39 41.4 39  Platelets 150 - 399 272 298 249   CMP Latest Ref Rng & Units 06/29/2020 03/21/2020 03/15/2020  Glucose 65 - 99 mg/dL - 176(H) 164(H)  BUN 4 - 21 30(A) 29(H) 34(H)  Creatinine 0.5 - 1.1 1.5(A) 1.69(H) 1.58(H)  Sodium 137 - 147 135(A) 137 135  Potassium 3.4 - 5.3 5.0 5.1 4.9  Chloride 99 - 108 99 97 94(L)  CO2 13 - 22 25(A) 23 22  Calcium 8.7 - 10.7 9.1 9.2 9.3  Total Protein 6.0 - 8.5 g/dL - - -  Total Bilirubin 0.0 - 1.2 mg/dL - - -  Alkaline Phos 25 - 125 113 - -  AST 13 - 35 30 - -  ALT 7 - 35 23 - -    ASSESSMENT & PLAN:  Assessment/Plan:  A 66 y.o. female with anemia secondary to previous iron deficiency and renal insufficiency.  When evaluating her labs today, I am very pleased with her  hemoglobin of 13.2.  Clinically, the patient appears to be doing well.  As that is the case, I will see her back in 6 months for repeat clinical assessment.  The patient understands all the plans discussed today and is in agreement with them.     I, Rita Ohara, am acting as scribe for Marice Potter, MD    I have reviewed this report as typed by the medical scribe, and it is complete and accurate.  Dequincy Macarthur Critchley, MD

## 2020-06-29 ENCOUNTER — Encounter: Payer: Self-pay | Admitting: Oncology

## 2020-06-29 ENCOUNTER — Inpatient Hospital Stay: Payer: Medicare Other | Attending: Oncology

## 2020-06-29 ENCOUNTER — Other Ambulatory Visit: Payer: Self-pay | Admitting: Oncology

## 2020-06-29 ENCOUNTER — Inpatient Hospital Stay (INDEPENDENT_AMBULATORY_CARE_PROVIDER_SITE_OTHER): Payer: Medicare Other | Admitting: Oncology

## 2020-06-29 ENCOUNTER — Other Ambulatory Visit: Payer: Self-pay

## 2020-06-29 VITALS — BP 186/80 | HR 66 | Temp 98.7°F | Resp 16 | Ht 64.0 in | Wt 309.8 lb

## 2020-06-29 DIAGNOSIS — N189 Chronic kidney disease, unspecified: Secondary | ICD-10-CM

## 2020-06-29 DIAGNOSIS — D508 Other iron deficiency anemias: Secondary | ICD-10-CM | POA: Diagnosis not present

## 2020-06-29 DIAGNOSIS — D631 Anemia in chronic kidney disease: Secondary | ICD-10-CM | POA: Insufficient documentation

## 2020-06-29 DIAGNOSIS — D649 Anemia, unspecified: Secondary | ICD-10-CM | POA: Diagnosis not present

## 2020-06-29 HISTORY — DX: Anemia in chronic kidney disease: D63.1

## 2020-06-29 LAB — CBC AND DIFFERENTIAL
HCT: 39 (ref 36–46)
Hemoglobin: 13.2 (ref 12.0–16.0)
Neutrophils Absolute: 5.78
Platelets: 272 (ref 150–399)
WBC: 8.5

## 2020-06-29 LAB — IRON AND TIBC
Iron: 76 ug/dL (ref 28–170)
Saturation Ratios: 19 % (ref 10.4–31.8)
TIBC: 402 ug/dL (ref 250–450)
UIBC: 326 ug/dL

## 2020-06-29 LAB — BASIC METABOLIC PANEL
BUN: 30 — AB (ref 4–21)
CO2: 25 — AB (ref 13–22)
Chloride: 99 (ref 99–108)
Creatinine: 1.5 — AB (ref 0.5–1.1)
Glucose: 179
Potassium: 5 (ref 3.4–5.3)
Sodium: 135 — AB (ref 137–147)

## 2020-06-29 LAB — HEPATIC FUNCTION PANEL
ALT: 23 (ref 7–35)
AST: 30 (ref 13–35)
Alkaline Phosphatase: 113 (ref 25–125)
Bilirubin, Total: 0.5

## 2020-06-29 LAB — FERRITIN: Ferritin: 74 ng/mL (ref 11–307)

## 2020-06-29 LAB — COMPREHENSIVE METABOLIC PANEL
Albumin: 4.3 (ref 3.5–5.0)
Calcium: 9.1 (ref 8.7–10.7)

## 2020-06-29 LAB — CBC: RBC: 4.44 (ref 3.87–5.11)

## 2020-07-25 ENCOUNTER — Other Ambulatory Visit: Payer: Self-pay | Admitting: Cardiology

## 2020-07-25 NOTE — Telephone Encounter (Signed)
Plavix sent

## 2020-07-29 ENCOUNTER — Ambulatory Visit: Payer: Medicare Other | Admitting: Cardiology

## 2020-08-11 ENCOUNTER — Ambulatory Visit (INDEPENDENT_AMBULATORY_CARE_PROVIDER_SITE_OTHER): Payer: Medicare Other

## 2020-08-11 DIAGNOSIS — I441 Atrioventricular block, second degree: Secondary | ICD-10-CM

## 2020-08-11 LAB — CUP PACEART REMOTE DEVICE CHECK
Battery Remaining Longevity: 94 mo
Battery Remaining Percentage: 85 %
Battery Voltage: 3.02 V
Brady Statistic AP VP Percent: 3.9 %
Brady Statistic AP VS Percent: 35 %
Brady Statistic AS VP Percent: 10 %
Brady Statistic AS VS Percent: 50 %
Brady Statistic RA Percent Paced: 38 %
Brady Statistic RV Percent Paced: 14 %
Date Time Interrogation Session: 20220811035429
Implantable Lead Implant Date: 20200814
Implantable Lead Implant Date: 20200814
Implantable Lead Location: 753859
Implantable Lead Location: 753860
Implantable Pulse Generator Implant Date: 20200814
Lead Channel Impedance Value: 430 Ohm
Lead Channel Impedance Value: 430 Ohm
Lead Channel Pacing Threshold Amplitude: 0.5 V
Lead Channel Pacing Threshold Amplitude: 1 V
Lead Channel Pacing Threshold Pulse Width: 0.5 ms
Lead Channel Pacing Threshold Pulse Width: 0.5 ms
Lead Channel Sensing Intrinsic Amplitude: 2.1 mV
Lead Channel Sensing Intrinsic Amplitude: 7.3 mV
Lead Channel Setting Pacing Amplitude: 2.5 V
Lead Channel Setting Pacing Amplitude: 2.5 V
Lead Channel Setting Pacing Pulse Width: 0.5 ms
Lead Channel Setting Sensing Sensitivity: 2 mV
Pulse Gen Model: 2272
Pulse Gen Serial Number: 9154171

## 2020-08-15 ENCOUNTER — Other Ambulatory Visit: Payer: Self-pay | Admitting: Cardiology

## 2020-08-29 IMAGING — CR CHEST - 2 VIEW
2 series · 2 of 2 positions shown · non-contrast
Comparison: 06/16/2018

CLINICAL DATA: Postop from pacemaker placement.

EXAM:
CHEST - 2 VIEW

[chest ap]
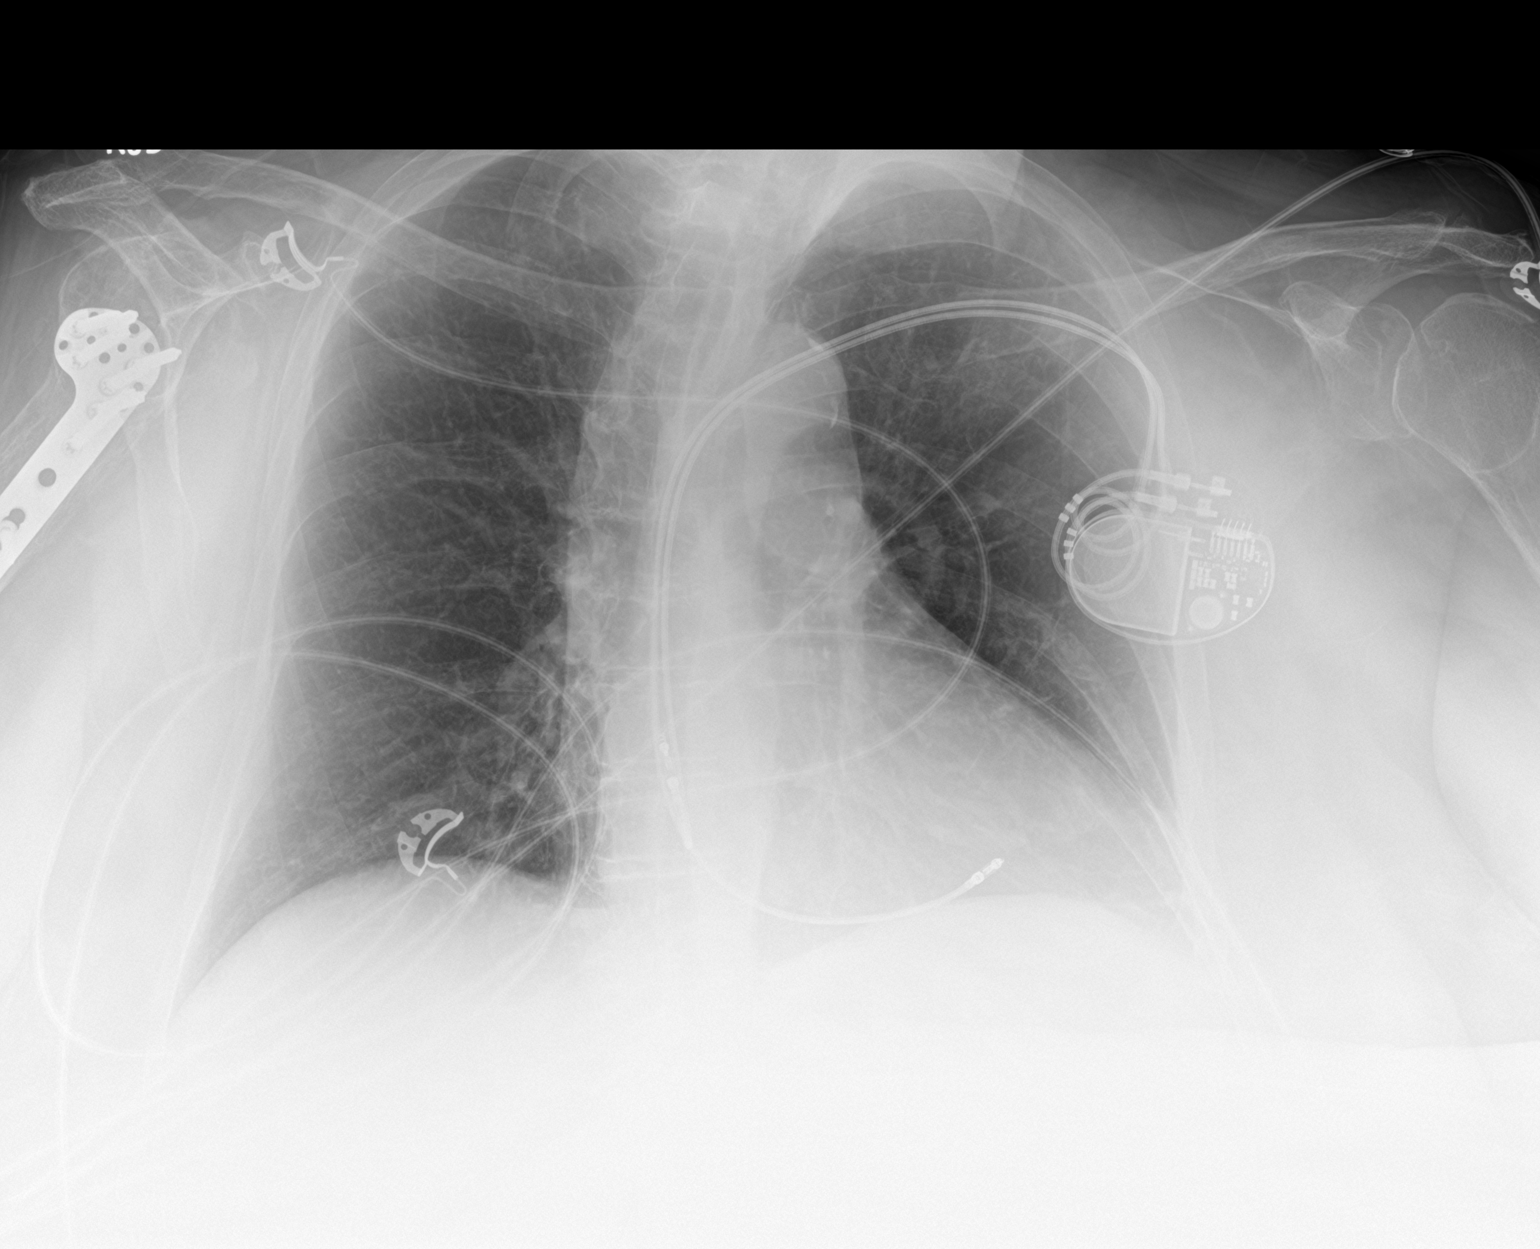

[chest lat]
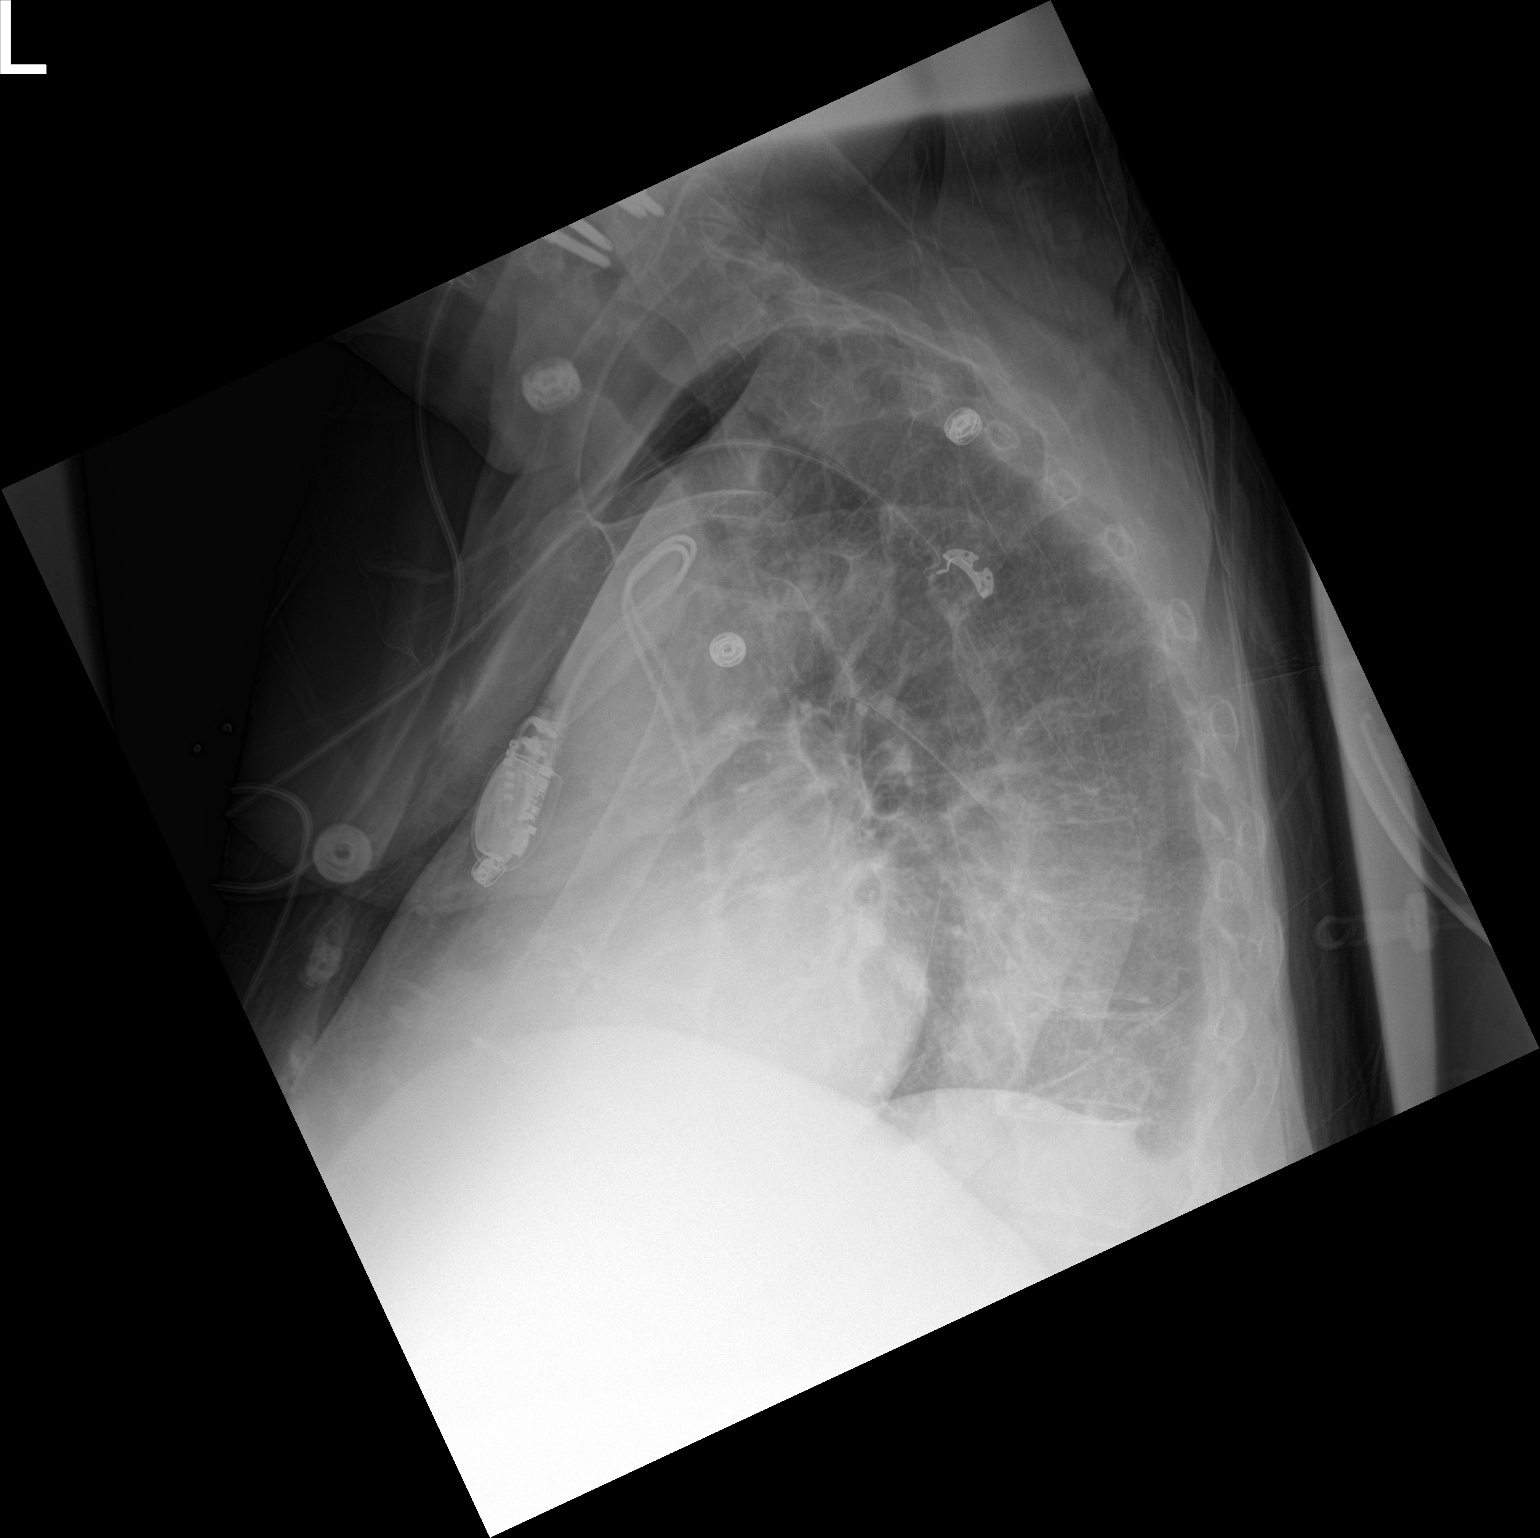

[2 of 2 positions shown; findings below may reference images not displayed]

FINDINGS: A new transvenous pacemaker seen with leads overlying the right
atrium and right ventricle. No evidence of pneumothorax or pleural
effusion. Both lungs are clear. Heart size remains normal. Aortic
atherosclerosis.
IMPRESSION: New transvenous pacemaker in appropriate position. No evidence of
pneumothorax or other acute findings.

## 2020-08-30 DIAGNOSIS — E039 Hypothyroidism, unspecified: Secondary | ICD-10-CM | POA: Diagnosis not present

## 2020-08-30 DIAGNOSIS — I10 Essential (primary) hypertension: Secondary | ICD-10-CM | POA: Diagnosis not present

## 2020-08-30 DIAGNOSIS — M1990 Unspecified osteoarthritis, unspecified site: Secondary | ICD-10-CM | POA: Diagnosis not present

## 2020-08-30 DIAGNOSIS — Z6841 Body Mass Index (BMI) 40.0 and over, adult: Secondary | ICD-10-CM | POA: Diagnosis not present

## 2020-08-30 DIAGNOSIS — D539 Nutritional anemia, unspecified: Secondary | ICD-10-CM | POA: Diagnosis not present

## 2020-08-30 DIAGNOSIS — N390 Urinary tract infection, site not specified: Secondary | ICD-10-CM | POA: Diagnosis not present

## 2020-08-30 DIAGNOSIS — N1831 Chronic kidney disease, stage 3a: Secondary | ICD-10-CM | POA: Diagnosis not present

## 2020-08-30 DIAGNOSIS — E785 Hyperlipidemia, unspecified: Secondary | ICD-10-CM | POA: Diagnosis not present

## 2020-08-30 DIAGNOSIS — E1122 Type 2 diabetes mellitus with diabetic chronic kidney disease: Secondary | ICD-10-CM | POA: Diagnosis not present

## 2020-08-30 DIAGNOSIS — N183 Chronic kidney disease, stage 3 unspecified: Secondary | ICD-10-CM | POA: Diagnosis not present

## 2020-08-30 DIAGNOSIS — K219 Gastro-esophageal reflux disease without esophagitis: Secondary | ICD-10-CM | POA: Diagnosis not present

## 2020-08-30 DIAGNOSIS — Z950 Presence of cardiac pacemaker: Secondary | ICD-10-CM | POA: Diagnosis not present

## 2020-08-30 DIAGNOSIS — R300 Dysuria: Secondary | ICD-10-CM | POA: Diagnosis not present

## 2020-09-01 NOTE — Progress Notes (Signed)
Remote pacemaker transmission.   

## 2020-09-03 DIAGNOSIS — E1122 Type 2 diabetes mellitus with diabetic chronic kidney disease: Secondary | ICD-10-CM | POA: Diagnosis not present

## 2020-09-03 DIAGNOSIS — F32A Depression, unspecified: Secondary | ICD-10-CM | POA: Diagnosis not present

## 2020-09-03 DIAGNOSIS — E876 Hypokalemia: Secondary | ICD-10-CM | POA: Diagnosis not present

## 2020-09-03 DIAGNOSIS — E559 Vitamin D deficiency, unspecified: Secondary | ICD-10-CM | POA: Diagnosis not present

## 2020-09-03 DIAGNOSIS — I5032 Chronic diastolic (congestive) heart failure: Secondary | ICD-10-CM | POA: Diagnosis not present

## 2020-09-03 DIAGNOSIS — I130 Hypertensive heart and chronic kidney disease with heart failure and stage 1 through stage 4 chronic kidney disease, or unspecified chronic kidney disease: Secondary | ICD-10-CM | POA: Diagnosis not present

## 2020-09-03 DIAGNOSIS — F419 Anxiety disorder, unspecified: Secondary | ICD-10-CM | POA: Diagnosis not present

## 2020-09-03 DIAGNOSIS — D631 Anemia in chronic kidney disease: Secondary | ICD-10-CM | POA: Diagnosis not present

## 2020-09-03 DIAGNOSIS — K219 Gastro-esophageal reflux disease without esophagitis: Secondary | ICD-10-CM | POA: Diagnosis not present

## 2020-09-03 DIAGNOSIS — E7800 Pure hypercholesterolemia, unspecified: Secondary | ICD-10-CM | POA: Diagnosis not present

## 2020-09-03 DIAGNOSIS — Z7902 Long term (current) use of antithrombotics/antiplatelets: Secondary | ICD-10-CM | POA: Diagnosis not present

## 2020-09-03 DIAGNOSIS — N390 Urinary tract infection, site not specified: Secondary | ICD-10-CM | POA: Diagnosis not present

## 2020-09-03 DIAGNOSIS — N329 Bladder disorder, unspecified: Secondary | ICD-10-CM | POA: Diagnosis not present

## 2020-09-03 DIAGNOSIS — D510 Vitamin B12 deficiency anemia due to intrinsic factor deficiency: Secondary | ICD-10-CM | POA: Diagnosis not present

## 2020-09-03 DIAGNOSIS — S31819D Unspecified open wound of right buttock, subsequent encounter: Secondary | ICD-10-CM | POA: Diagnosis not present

## 2020-09-03 DIAGNOSIS — Z7984 Long term (current) use of oral hypoglycemic drugs: Secondary | ICD-10-CM | POA: Diagnosis not present

## 2020-09-03 DIAGNOSIS — M1990 Unspecified osteoarthritis, unspecified site: Secondary | ICD-10-CM | POA: Diagnosis not present

## 2020-09-03 DIAGNOSIS — I2510 Atherosclerotic heart disease of native coronary artery without angina pectoris: Secondary | ICD-10-CM | POA: Diagnosis not present

## 2020-09-03 DIAGNOSIS — Z87891 Personal history of nicotine dependence: Secondary | ICD-10-CM | POA: Diagnosis not present

## 2020-09-03 DIAGNOSIS — G4733 Obstructive sleep apnea (adult) (pediatric): Secondary | ICD-10-CM | POA: Diagnosis not present

## 2020-09-03 DIAGNOSIS — J329 Chronic sinusitis, unspecified: Secondary | ICD-10-CM | POA: Diagnosis not present

## 2020-09-03 DIAGNOSIS — N184 Chronic kidney disease, stage 4 (severe): Secondary | ICD-10-CM | POA: Diagnosis not present

## 2020-09-03 DIAGNOSIS — G8929 Other chronic pain: Secondary | ICD-10-CM | POA: Diagnosis not present

## 2020-09-03 DIAGNOSIS — M8580 Other specified disorders of bone density and structure, unspecified site: Secondary | ICD-10-CM | POA: Diagnosis not present

## 2020-09-03 DIAGNOSIS — E039 Hypothyroidism, unspecified: Secondary | ICD-10-CM | POA: Diagnosis not present

## 2020-09-08 DIAGNOSIS — F419 Anxiety disorder, unspecified: Secondary | ICD-10-CM | POA: Diagnosis not present

## 2020-09-08 DIAGNOSIS — G8929 Other chronic pain: Secondary | ICD-10-CM | POA: Diagnosis not present

## 2020-09-08 DIAGNOSIS — S31819D Unspecified open wound of right buttock, subsequent encounter: Secondary | ICD-10-CM | POA: Diagnosis not present

## 2020-09-08 DIAGNOSIS — Z7984 Long term (current) use of oral hypoglycemic drugs: Secondary | ICD-10-CM | POA: Diagnosis not present

## 2020-09-08 DIAGNOSIS — F32A Depression, unspecified: Secondary | ICD-10-CM | POA: Diagnosis not present

## 2020-09-08 DIAGNOSIS — M1990 Unspecified osteoarthritis, unspecified site: Secondary | ICD-10-CM | POA: Diagnosis not present

## 2020-09-12 ENCOUNTER — Other Ambulatory Visit: Payer: Self-pay | Admitting: Cardiology

## 2020-09-14 DIAGNOSIS — M1990 Unspecified osteoarthritis, unspecified site: Secondary | ICD-10-CM | POA: Diagnosis not present

## 2020-09-14 DIAGNOSIS — S31819D Unspecified open wound of right buttock, subsequent encounter: Secondary | ICD-10-CM | POA: Diagnosis not present

## 2020-09-14 DIAGNOSIS — F32A Depression, unspecified: Secondary | ICD-10-CM | POA: Diagnosis not present

## 2020-09-14 DIAGNOSIS — Z7984 Long term (current) use of oral hypoglycemic drugs: Secondary | ICD-10-CM | POA: Diagnosis not present

## 2020-09-14 DIAGNOSIS — F419 Anxiety disorder, unspecified: Secondary | ICD-10-CM | POA: Diagnosis not present

## 2020-09-14 DIAGNOSIS — G8929 Other chronic pain: Secondary | ICD-10-CM | POA: Diagnosis not present

## 2020-09-15 DIAGNOSIS — G8929 Other chronic pain: Secondary | ICD-10-CM | POA: Diagnosis not present

## 2020-09-15 DIAGNOSIS — F419 Anxiety disorder, unspecified: Secondary | ICD-10-CM | POA: Diagnosis not present

## 2020-09-15 DIAGNOSIS — M1990 Unspecified osteoarthritis, unspecified site: Secondary | ICD-10-CM | POA: Diagnosis not present

## 2020-09-15 DIAGNOSIS — S31819D Unspecified open wound of right buttock, subsequent encounter: Secondary | ICD-10-CM | POA: Diagnosis not present

## 2020-09-15 DIAGNOSIS — F32A Depression, unspecified: Secondary | ICD-10-CM | POA: Diagnosis not present

## 2020-09-15 DIAGNOSIS — Z7984 Long term (current) use of oral hypoglycemic drugs: Secondary | ICD-10-CM | POA: Diagnosis not present

## 2020-09-20 DIAGNOSIS — G8929 Other chronic pain: Secondary | ICD-10-CM | POA: Diagnosis not present

## 2020-09-20 DIAGNOSIS — M1990 Unspecified osteoarthritis, unspecified site: Secondary | ICD-10-CM | POA: Diagnosis not present

## 2020-09-20 DIAGNOSIS — F32A Depression, unspecified: Secondary | ICD-10-CM | POA: Diagnosis not present

## 2020-09-20 DIAGNOSIS — Z7984 Long term (current) use of oral hypoglycemic drugs: Secondary | ICD-10-CM | POA: Diagnosis not present

## 2020-09-20 DIAGNOSIS — F419 Anxiety disorder, unspecified: Secondary | ICD-10-CM | POA: Diagnosis not present

## 2020-09-20 DIAGNOSIS — S31819D Unspecified open wound of right buttock, subsequent encounter: Secondary | ICD-10-CM | POA: Diagnosis not present

## 2020-09-22 DIAGNOSIS — F32A Depression, unspecified: Secondary | ICD-10-CM | POA: Diagnosis not present

## 2020-09-22 DIAGNOSIS — S31819D Unspecified open wound of right buttock, subsequent encounter: Secondary | ICD-10-CM | POA: Diagnosis not present

## 2020-09-22 DIAGNOSIS — G8929 Other chronic pain: Secondary | ICD-10-CM | POA: Diagnosis not present

## 2020-09-22 DIAGNOSIS — Z7984 Long term (current) use of oral hypoglycemic drugs: Secondary | ICD-10-CM | POA: Diagnosis not present

## 2020-09-22 DIAGNOSIS — M1990 Unspecified osteoarthritis, unspecified site: Secondary | ICD-10-CM | POA: Diagnosis not present

## 2020-09-22 DIAGNOSIS — F419 Anxiety disorder, unspecified: Secondary | ICD-10-CM | POA: Diagnosis not present

## 2020-09-27 DIAGNOSIS — Z7984 Long term (current) use of oral hypoglycemic drugs: Secondary | ICD-10-CM | POA: Diagnosis not present

## 2020-09-27 DIAGNOSIS — F419 Anxiety disorder, unspecified: Secondary | ICD-10-CM | POA: Diagnosis not present

## 2020-09-27 DIAGNOSIS — M1990 Unspecified osteoarthritis, unspecified site: Secondary | ICD-10-CM | POA: Diagnosis not present

## 2020-09-27 DIAGNOSIS — S31819D Unspecified open wound of right buttock, subsequent encounter: Secondary | ICD-10-CM | POA: Diagnosis not present

## 2020-09-27 DIAGNOSIS — F32A Depression, unspecified: Secondary | ICD-10-CM | POA: Diagnosis not present

## 2020-09-27 DIAGNOSIS — G8929 Other chronic pain: Secondary | ICD-10-CM | POA: Diagnosis not present

## 2020-09-29 DIAGNOSIS — Z7984 Long term (current) use of oral hypoglycemic drugs: Secondary | ICD-10-CM | POA: Diagnosis not present

## 2020-09-29 DIAGNOSIS — G8929 Other chronic pain: Secondary | ICD-10-CM | POA: Diagnosis not present

## 2020-09-29 DIAGNOSIS — S31819D Unspecified open wound of right buttock, subsequent encounter: Secondary | ICD-10-CM | POA: Diagnosis not present

## 2020-09-29 DIAGNOSIS — F419 Anxiety disorder, unspecified: Secondary | ICD-10-CM | POA: Diagnosis not present

## 2020-09-29 DIAGNOSIS — F32A Depression, unspecified: Secondary | ICD-10-CM | POA: Diagnosis not present

## 2020-09-29 DIAGNOSIS — M1990 Unspecified osteoarthritis, unspecified site: Secondary | ICD-10-CM | POA: Diagnosis not present

## 2020-09-30 ENCOUNTER — Ambulatory Visit: Payer: Medicare Other | Admitting: Cardiology

## 2020-10-03 DIAGNOSIS — G8929 Other chronic pain: Secondary | ICD-10-CM | POA: Diagnosis not present

## 2020-10-03 DIAGNOSIS — E559 Vitamin D deficiency, unspecified: Secondary | ICD-10-CM | POA: Diagnosis not present

## 2020-10-03 DIAGNOSIS — I5032 Chronic diastolic (congestive) heart failure: Secondary | ICD-10-CM | POA: Diagnosis not present

## 2020-10-03 DIAGNOSIS — K219 Gastro-esophageal reflux disease without esophagitis: Secondary | ICD-10-CM | POA: Diagnosis not present

## 2020-10-03 DIAGNOSIS — F32A Depression, unspecified: Secondary | ICD-10-CM | POA: Diagnosis not present

## 2020-10-03 DIAGNOSIS — D510 Vitamin B12 deficiency anemia due to intrinsic factor deficiency: Secondary | ICD-10-CM | POA: Diagnosis not present

## 2020-10-03 DIAGNOSIS — E1122 Type 2 diabetes mellitus with diabetic chronic kidney disease: Secondary | ICD-10-CM | POA: Diagnosis not present

## 2020-10-03 DIAGNOSIS — F419 Anxiety disorder, unspecified: Secondary | ICD-10-CM | POA: Diagnosis not present

## 2020-10-03 DIAGNOSIS — G4733 Obstructive sleep apnea (adult) (pediatric): Secondary | ICD-10-CM | POA: Diagnosis not present

## 2020-10-03 DIAGNOSIS — M1990 Unspecified osteoarthritis, unspecified site: Secondary | ICD-10-CM | POA: Diagnosis not present

## 2020-10-03 DIAGNOSIS — Z7984 Long term (current) use of oral hypoglycemic drugs: Secondary | ICD-10-CM | POA: Diagnosis not present

## 2020-10-03 DIAGNOSIS — E876 Hypokalemia: Secondary | ICD-10-CM | POA: Diagnosis not present

## 2020-10-03 DIAGNOSIS — Z87891 Personal history of nicotine dependence: Secondary | ICD-10-CM | POA: Diagnosis not present

## 2020-10-03 DIAGNOSIS — M8580 Other specified disorders of bone density and structure, unspecified site: Secondary | ICD-10-CM | POA: Diagnosis not present

## 2020-10-03 DIAGNOSIS — N184 Chronic kidney disease, stage 4 (severe): Secondary | ICD-10-CM | POA: Diagnosis not present

## 2020-10-03 DIAGNOSIS — N329 Bladder disorder, unspecified: Secondary | ICD-10-CM | POA: Diagnosis not present

## 2020-10-03 DIAGNOSIS — E7800 Pure hypercholesterolemia, unspecified: Secondary | ICD-10-CM | POA: Diagnosis not present

## 2020-10-03 DIAGNOSIS — I2510 Atherosclerotic heart disease of native coronary artery without angina pectoris: Secondary | ICD-10-CM | POA: Diagnosis not present

## 2020-10-03 DIAGNOSIS — N390 Urinary tract infection, site not specified: Secondary | ICD-10-CM | POA: Diagnosis not present

## 2020-10-03 DIAGNOSIS — E039 Hypothyroidism, unspecified: Secondary | ICD-10-CM | POA: Diagnosis not present

## 2020-10-03 DIAGNOSIS — Z7902 Long term (current) use of antithrombotics/antiplatelets: Secondary | ICD-10-CM | POA: Diagnosis not present

## 2020-10-03 DIAGNOSIS — D631 Anemia in chronic kidney disease: Secondary | ICD-10-CM | POA: Diagnosis not present

## 2020-10-03 DIAGNOSIS — J329 Chronic sinusitis, unspecified: Secondary | ICD-10-CM | POA: Diagnosis not present

## 2020-10-03 DIAGNOSIS — S31819D Unspecified open wound of right buttock, subsequent encounter: Secondary | ICD-10-CM | POA: Diagnosis not present

## 2020-10-03 DIAGNOSIS — I130 Hypertensive heart and chronic kidney disease with heart failure and stage 1 through stage 4 chronic kidney disease, or unspecified chronic kidney disease: Secondary | ICD-10-CM | POA: Diagnosis not present

## 2020-10-04 DIAGNOSIS — G8929 Other chronic pain: Secondary | ICD-10-CM | POA: Diagnosis not present

## 2020-10-04 DIAGNOSIS — M1990 Unspecified osteoarthritis, unspecified site: Secondary | ICD-10-CM | POA: Diagnosis not present

## 2020-10-04 DIAGNOSIS — S31819D Unspecified open wound of right buttock, subsequent encounter: Secondary | ICD-10-CM | POA: Diagnosis not present

## 2020-10-04 DIAGNOSIS — F32A Depression, unspecified: Secondary | ICD-10-CM | POA: Diagnosis not present

## 2020-10-04 DIAGNOSIS — Z7984 Long term (current) use of oral hypoglycemic drugs: Secondary | ICD-10-CM | POA: Diagnosis not present

## 2020-10-04 DIAGNOSIS — F419 Anxiety disorder, unspecified: Secondary | ICD-10-CM | POA: Diagnosis not present

## 2020-10-06 DIAGNOSIS — Z7984 Long term (current) use of oral hypoglycemic drugs: Secondary | ICD-10-CM | POA: Diagnosis not present

## 2020-10-06 DIAGNOSIS — S31819D Unspecified open wound of right buttock, subsequent encounter: Secondary | ICD-10-CM | POA: Diagnosis not present

## 2020-10-06 DIAGNOSIS — F32A Depression, unspecified: Secondary | ICD-10-CM | POA: Diagnosis not present

## 2020-10-06 DIAGNOSIS — F419 Anxiety disorder, unspecified: Secondary | ICD-10-CM | POA: Diagnosis not present

## 2020-10-06 DIAGNOSIS — G8929 Other chronic pain: Secondary | ICD-10-CM | POA: Diagnosis not present

## 2020-10-06 DIAGNOSIS — M1990 Unspecified osteoarthritis, unspecified site: Secondary | ICD-10-CM | POA: Diagnosis not present

## 2020-10-11 DIAGNOSIS — G8929 Other chronic pain: Secondary | ICD-10-CM | POA: Diagnosis not present

## 2020-10-11 DIAGNOSIS — F419 Anxiety disorder, unspecified: Secondary | ICD-10-CM | POA: Diagnosis not present

## 2020-10-11 DIAGNOSIS — M1990 Unspecified osteoarthritis, unspecified site: Secondary | ICD-10-CM | POA: Diagnosis not present

## 2020-10-11 DIAGNOSIS — F32A Depression, unspecified: Secondary | ICD-10-CM | POA: Diagnosis not present

## 2020-10-11 DIAGNOSIS — Z7984 Long term (current) use of oral hypoglycemic drugs: Secondary | ICD-10-CM | POA: Diagnosis not present

## 2020-10-11 DIAGNOSIS — S31819D Unspecified open wound of right buttock, subsequent encounter: Secondary | ICD-10-CM | POA: Diagnosis not present

## 2020-10-13 DIAGNOSIS — F419 Anxiety disorder, unspecified: Secondary | ICD-10-CM | POA: Diagnosis not present

## 2020-10-13 DIAGNOSIS — Z7984 Long term (current) use of oral hypoglycemic drugs: Secondary | ICD-10-CM | POA: Diagnosis not present

## 2020-10-13 DIAGNOSIS — S31819D Unspecified open wound of right buttock, subsequent encounter: Secondary | ICD-10-CM | POA: Diagnosis not present

## 2020-10-13 DIAGNOSIS — G8929 Other chronic pain: Secondary | ICD-10-CM | POA: Diagnosis not present

## 2020-10-13 DIAGNOSIS — F32A Depression, unspecified: Secondary | ICD-10-CM | POA: Diagnosis not present

## 2020-10-13 DIAGNOSIS — M1990 Unspecified osteoarthritis, unspecified site: Secondary | ICD-10-CM | POA: Diagnosis not present

## 2020-11-10 ENCOUNTER — Ambulatory Visit (INDEPENDENT_AMBULATORY_CARE_PROVIDER_SITE_OTHER): Payer: Medicare Other

## 2020-11-10 DIAGNOSIS — I441 Atrioventricular block, second degree: Secondary | ICD-10-CM

## 2020-11-10 LAB — CUP PACEART REMOTE DEVICE CHECK
Battery Remaining Longevity: 91 mo
Battery Remaining Percentage: 83 %
Battery Voltage: 3.02 V
Brady Statistic AP VP Percent: 2.8 %
Brady Statistic AP VS Percent: 38 %
Brady Statistic AS VP Percent: 12 %
Brady Statistic AS VS Percent: 47 %
Brady Statistic RA Percent Paced: 40 %
Brady Statistic RV Percent Paced: 15 %
Date Time Interrogation Session: 20221110033304
Implantable Lead Implant Date: 20200814
Implantable Lead Implant Date: 20200814
Implantable Lead Location: 753859
Implantable Lead Location: 753860
Implantable Pulse Generator Implant Date: 20200814
Lead Channel Impedance Value: 430 Ohm
Lead Channel Impedance Value: 430 Ohm
Lead Channel Pacing Threshold Amplitude: 0.5 V
Lead Channel Pacing Threshold Amplitude: 1 V
Lead Channel Pacing Threshold Pulse Width: 0.5 ms
Lead Channel Pacing Threshold Pulse Width: 0.5 ms
Lead Channel Sensing Intrinsic Amplitude: 1.5 mV
Lead Channel Sensing Intrinsic Amplitude: 7.3 mV
Lead Channel Setting Pacing Amplitude: 2.5 V
Lead Channel Setting Pacing Amplitude: 2.5 V
Lead Channel Setting Pacing Pulse Width: 0.5 ms
Lead Channel Setting Sensing Sensitivity: 2 mV
Pulse Gen Model: 2272
Pulse Gen Serial Number: 9154171

## 2020-11-18 NOTE — Progress Notes (Signed)
Remote pacemaker transmission.   

## 2020-12-01 DIAGNOSIS — Z950 Presence of cardiac pacemaker: Secondary | ICD-10-CM | POA: Diagnosis not present

## 2020-12-01 DIAGNOSIS — E785 Hyperlipidemia, unspecified: Secondary | ICD-10-CM | POA: Diagnosis not present

## 2020-12-01 DIAGNOSIS — E1122 Type 2 diabetes mellitus with diabetic chronic kidney disease: Secondary | ICD-10-CM | POA: Diagnosis not present

## 2020-12-01 DIAGNOSIS — R600 Localized edema: Secondary | ICD-10-CM | POA: Diagnosis not present

## 2020-12-01 DIAGNOSIS — Z23 Encounter for immunization: Secondary | ICD-10-CM | POA: Diagnosis not present

## 2020-12-01 DIAGNOSIS — I10 Essential (primary) hypertension: Secondary | ICD-10-CM | POA: Diagnosis not present

## 2020-12-01 DIAGNOSIS — K219 Gastro-esophageal reflux disease without esophagitis: Secondary | ICD-10-CM | POA: Diagnosis not present

## 2020-12-01 DIAGNOSIS — M1990 Unspecified osteoarthritis, unspecified site: Secondary | ICD-10-CM | POA: Diagnosis not present

## 2020-12-01 DIAGNOSIS — D539 Nutritional anemia, unspecified: Secondary | ICD-10-CM | POA: Diagnosis not present

## 2020-12-01 DIAGNOSIS — E039 Hypothyroidism, unspecified: Secondary | ICD-10-CM | POA: Diagnosis not present

## 2020-12-01 DIAGNOSIS — Z6841 Body Mass Index (BMI) 40.0 and over, adult: Secondary | ICD-10-CM | POA: Diagnosis not present

## 2020-12-01 DIAGNOSIS — N1831 Chronic kidney disease, stage 3a: Secondary | ICD-10-CM | POA: Diagnosis not present

## 2020-12-05 ENCOUNTER — Other Ambulatory Visit: Payer: Self-pay

## 2020-12-05 ENCOUNTER — Encounter: Payer: Self-pay | Admitting: Cardiology

## 2020-12-05 ENCOUNTER — Ambulatory Visit (INDEPENDENT_AMBULATORY_CARE_PROVIDER_SITE_OTHER): Payer: Medicare Other | Admitting: Cardiology

## 2020-12-05 VITALS — BP 142/76 | HR 78 | Ht 64.0 in | Wt 319.6 lb

## 2020-12-05 DIAGNOSIS — R0609 Other forms of dyspnea: Secondary | ICD-10-CM | POA: Diagnosis not present

## 2020-12-05 DIAGNOSIS — Z950 Presence of cardiac pacemaker: Secondary | ICD-10-CM

## 2020-12-05 DIAGNOSIS — I350 Nonrheumatic aortic (valve) stenosis: Secondary | ICD-10-CM | POA: Diagnosis not present

## 2020-12-05 DIAGNOSIS — I10 Essential (primary) hypertension: Secondary | ICD-10-CM | POA: Diagnosis not present

## 2020-12-05 DIAGNOSIS — E119 Type 2 diabetes mellitus without complications: Secondary | ICD-10-CM | POA: Diagnosis not present

## 2020-12-05 DIAGNOSIS — I441 Atrioventricular block, second degree: Secondary | ICD-10-CM | POA: Diagnosis not present

## 2020-12-05 NOTE — Patient Instructions (Signed)
Medication Instructions:  Your physician recommends that you continue on your current medications as directed. Please refer to the Current Medication list given to you today.  *If you need a refill on your cardiac medications before your next appointment, please call your pharmacy*   Lab Work: Your physician recommends that you return for lab work today: bmp, pro bnp , cbc  If you have labs (blood work) drawn today and your tests are completely normal, you will receive your results only by: MyChart Message (if you have MyChart) OR A paper copy in the mail If you have any lab test that is abnormal or we need to change your treatment, we will call you to review the results.   Testing/Procedures: Your physician has requested that you have an echocardiogram. Echocardiography is a painless test that uses sound waves to create images of your heart. It provides your doctor with information about the size and shape of your heart and how well your heart's chambers and valves are working. This procedure takes approximately one hour. There are no restrictions for this procedure.    Follow-Up: At [facility], you and your health needs are our priority.  As part of our continuing mission to provide you with exceptional heart care, we have created designated Provider Care Teams.  These Care Teams include your primary Cardiologist (physician) and Advanced Practice Providers (APPs -  Physician Assistants and Nurse Practitioners) who all work together to provide you with the care you need, when you need it.  We recommend signing up for the patient portal called "MyChart".  Sign up information is provided on this After Visit Summary.  MyChart is used to connect with patients for Virtual Visits (Telemedicine).  Patients are able to view lab/test results, encounter notes, upcoming appointments, etc.  Non-urgent messages can be sent to your provider as well.   To learn more about what you can do with MyChart,  go to [email or web address].    Your next appointment:   5 month(s)  The format for your next appointment:   In Person  Provider:   Jenne Campus, MD    Other Instructions  Echocardiogram An echocardiogram is a test that uses sound waves (ultrasound) to produce images of the heart. Images from an echocardiogram can provide important information about: Heart size and shape. The size and thickness and movement of your heart's walls. Heart muscle function and strength. Heart valve function or if you have stenosis. Stenosis is when the heart valves are too narrow. If blood is flowing backward through the heart valves (regurgitation). A tumor or infectious growth around the heart valves. Areas of heart muscle that are not working well because of poor blood flow or injury from a heart attack. Aneurysm detection. An aneurysm is a weak or damaged part of an artery wall. The wall bulges out from the normal force of blood pumping through the body. Tell a health care provider about: Any allergies you have. All medicines you are taking, including vitamins, herbs, eye drops, creams, and over-the-counter medicines. Any blood disorders you have. Any surgeries you have had. Any medical conditions you have. Whether you are pregnant or may be pregnant. What are the risks? Generally, this is a safe test. However, problems may occur, including an allergic reaction to dye (contrast) that may be used during the test. What happens before the test? No specific preparation is needed. You may eat and drink normally. What happens during the test?  You will take off your clothes from  the waist up and put on a hospital gown. Electrodes or electrocardiogram (ECG)patches may be placed on your chest. The electrodes or patches are then connected to a device that monitors your heart rate and rhythm. You will lie down on a table for an ultrasound exam. A gel will be applied to your chest to help sound  waves pass through your skin. A handheld device, called a transducer, will be pressed against your chest and moved over your heart. The transducer produces sound waves that travel to your heart and bounce back (or "echo" back) to the transducer. These sound waves will be captured in real-time and changed into images of your heart that can be viewed on a video monitor. The images will be recorded on a computer and reviewed by your health care provider. You may be asked to change positions or hold your breath for a short time. This makes it easier to get different views or better views of your heart. In some cases, you may receive contrast through an IV in one of your veins. This can improve the quality of the pictures from your heart. The procedure may vary among health care providers and hospitals. What can I expect after the test? You may return to your normal, everyday life, including diet, activities, and medicines, unless your health care provider tells you not to do that. Follow these instructions at home: It is up to you to get the results of your test. Ask your health care provider, or the department that is doing the test, when your results will be ready. Keep all follow-up visits. This is important. Summary An echocardiogram is a test that uses sound waves (ultrasound) to produce images of the heart. Images from an echocardiogram can provide important information about the size and shape of your heart, heart muscle function, heart valve function, and other possible heart problems. You do not need to do anything to prepare before this test. You may eat and drink normally. After the echocardiogram is completed, you may return to your normal, everyday life, unless your health care provider tells you not to do that. This information is not intended to replace advice given to you by your health care provider. Make sure you discuss any questions you have with your health care provider. Document  Revised: 08/31/2020 Document Reviewed: 08/11/2019 Elsevier Patient Education  2022 [employer].

## 2020-12-05 NOTE — Progress Notes (Signed)
Cardiology Office Note:    Date:  12/05/2020   ID:  Vickie Ferguson, DOB 1955/11/01, MRN [id number]  PCP:  Hurshel Party, NP  Cardiologist:  Gypsy Balsam, MD    Referring MD: Hurshel Party, NP   Chief Complaint  Patient presents with   Medication Management    History of Present Illness:    Vickie Ferguson is a 65 y.o. female with past medical history significant for essential hypertension, she does have dual-chamber pacemaker secondary to AV block, aortic stenosis which was moderate based on assessment from March 2022, cardiomyopathy with ejection fraction 45% based on echocardiogram from March 2022 congestive heart failure, ventricular ectopy. She is coming today to also follow-up.  She described the fact that last week she felt poorly she had a lot of extrasystole now things are quiet down.  When she does not have any issues right now.  Past Medical History:  Diagnosis Date   Anemia    Anginal pain (HCC)    Aortic stenosis 03/21/2020   Arthritis    CKD (chronic kidney disease), stage III (HCC)    Complication of anesthesia    Congestive heart failure (CHF) (HCC) 06/18/2019   Diabetes mellitus without complication (HCC)    Essential hypertension 07/23/2014   Family history of adverse reaction to anesthesia    " FAMILY MEMBERS HAD DIFFICULTY WAKING "   Hypertension    Hypothyroidism    Iron deficiency anemia 07/23/2014   Long term current use of insulin (HCC) 01/07/2017   Morbid (severe) obesity due to excess calories (HCC) 07/23/2014   OSA (obstructive sleep apnea) 04/26/2017   Other long term (current) drug therapy 01/07/2017   Pacemaker Saint Jude device 09/01/2018   Palpitations 11/19/2019   PONV (postoperative nausea and vomiting)    Precordial pain 07/23/2014   Presence of permanent cardiac pacemaker 08/15/2018   St Jude Medical Assurity MRI dual-chamber pacemaker for symptomatic bradycardia   Pressure injury of skin 06/17/2018   Second degree AV block     Shortness of breath 04/26/2017   TIA (transient ischemic attack) 04/26/2017   Type 2 diabetes mellitus without complication (HCC) 07/23/2014   Unstable angina (HCC) 06/17/2018   Ventricular tachycardia (paroxysmal) 07/23/2014    Past Surgical History:  Procedure Laterality Date   CARDIAC CATHETERIZATION     INSERT / REPLACE / REMOVE PACEMAKER  08/15/2018   St Jude Medical Assurity MRI dual-chamber pacemaker for symptomatic bradycardia   LEFT HEART CATH AND CORONARY ANGIOGRAPHY N/A 06/17/2018   Procedure: LEFT HEART CATH AND CORONARY ANGIOGRAPHY;  Surgeon: [place], Peter M, MD;  Location: [facility] INVASIVE CV LAB;  Service: Cardiovascular;  Laterality: N/A;   PACEMAKER IMPLANT N/A 08/15/2018   Procedure: PACEMAKER IMPLANT;  Surgeon: Regan Lemming, MD;  Location: [facility] INVASIVE CV LAB;  Service: Cardiovascular;  Laterality: N/A;   SHOULDER SURGERY Right 2012   METAL PLATE    TOTAL KNEE ARTHROPLASTY Right 2001    Current Medications: Current Meds  Medication Sig   acetaminophen (TYLENOL) 500 MG tablet Take 500 mg by mouth every 6 (six) hours as needed for mild pain or headache.   atorvastatin (LIPITOR) 40 MG tablet TAKE 1 TABLET BY MOUTH AT BEDTIME. (Patient taking differently: Take 40 mg by mouth daily.)   clopidogrel (PLAVIX) 75 MG tablet TAKE 1 TABLET BY MOUTH ONCE DAILY. (Patient taking differently: Take 75 mg by mouth daily.)   dexlansoprazole (DEXILANT) 60 MG capsule Take 60 mg by mouth daily.  fluticasone (FLONASE) 50 MCG/ACT nasal spray Place 1 spray into both nostrils daily as needed for allergies. For allergies   furosemide (LASIX) 40 MG tablet TAKE 1/2 TO 1 TABLET AS DIRECTED, TAKE $RemoveBefore'40MG'GrZogptrsjbvs$  IN THE MORNING AND $RemoveBef'20MG'CyeKHWMAlr$  IN THE EVENING, IF GAINS 2 POUNDS IN 3 DAYS THEN TAKE EXTRA $RemoveBe'20MG'ftEHnQhcL$  IN EVENING. (Patient taking differently: Take 80 mg by mouth daily.)   levothyroxine (SYNTHROID, LEVOTHROID) 75 MCG tablet Take 75 mcg by mouth daily before breakfast.    metFORMIN (GLUCOPHAGE) 500 MG tablet Take  500 mg by mouth 2 (two) times daily with a meal.   metoprolol tartrate (LOPRESSOR) 50 MG tablet TAKE 1 TABLET BY MOUTH TWICE DAILY. (Patient taking differently: Take 50 mg by mouth 2 (two) times daily.)   nitroGLYCERIN (NITROSTAT) 0.4 MG SL tablet Place 1 tablet (0.4 mg total) under the tongue every 5 (five) minutes as needed. (Patient taking differently: Place 0.4 mg under the tongue every 5 (five) minutes as needed for chest pain.)   Oxycodone HCl 10 MG TABS Take 1 tablet by mouth as needed (pain).   polyvinyl alcohol (LIQUIFILM TEARS) 1.4 % ophthalmic solution Place 1 drop into both eyes as needed for dry eyes.     Allergies:   Aleve [naproxen sodium], Procainamide, Rythmol [propafenone hcl], Codeine, Doxycycline, Isosorbide nitrate, and Sertraline   Social History   Socioeconomic History   Marital status: Legally Separated    Spouse name: Not on file   Number of children: Not on file   Years of education: Not on file   Highest education level: Not on file  Occupational History   Not on file  Tobacco Use   Smoking status: Never   Smokeless tobacco: Never  Vaping Use   Vaping Use: Never used  Substance and Sexual Activity   Alcohol use: No    Alcohol/week: 0.0 standard drinks   Drug use: No   Sexual activity: Not on file  Other Topics Concern   Not on file  Social History Narrative   Not on file   Social Determinants of Health   Financial Resource Strain: Not on file  Food Insecurity: Not on file  Transportation Needs: Not on file  Physical Activity: Not on file  Stress: Not on file  Social Connections: Not on file     Family History: [name] family history includes Heart disease in her mother. ROS:   Please see the history of present illness.    All 14 point review of systems negative except as described per history of present illness  EKGs/Labs/Other Studies Reviewed:      Recent Labs: 02/22/2020: Magnesium 2.0 03/15/2020: NT-Pro BNP 542 06/29/2020: ALT  23; BUN 30; Creatinine 1.5; Hemoglobin 13.2; Platelets 272; Potassium 5.0; Sodium 135  Recent Lipid Panel No results found for: CHOL, TRIG, HDL, CHOLHDL, VLDL, LDLCALC, LDLDIRECT  Physical Exam:    VS:  BP (!) 142/76 (BP Location: Left Arm, Patient Position: Sitting)   Pulse 78   Ht $R'5\' 4"'PL$  (1.626 m)   Wt (!) 319 lb 9.6 oz (145 kg)   SpO2 96%   BMI 54.86 kg/m     Wt Readings from Last 3 Encounters:  12/05/20 (!) 319 lb 9.6 oz (145 kg)  06/29/20 (!) 309 lb 12.8 oz (140.5 kg)  05/31/20 (!) 308 lb (139.7 kg)     GEN:  Well nourished, well developed in no acute distress HEENT: Normal NECK: No JVD; No carotid bruits LYMPHATICS: No lymphadenopathy CARDIAC: RRR, no murmurs, no rubs, no  gallops RESPIRATORY:  Clear to auscultation without rales, wheezing or rhonchi  ABDOMEN: Soft, non-tender, non-distended MUSCULOSKELETAL:  No edema; No deformity  SKIN: Warm and dry LOWER EXTREMITIES: no swelling NEUROLOGIC:  Alert and oriented x 3 PSYCHIATRIC:  Normal affect   ASSESSMENT:    1. Essential hypertension   2. Nonrheumatic aortic valve stenosis   3. Second degree AV block   4. Pacemaker [place] Jude device   5. Type 2 diabetes mellitus without complication, without long-term current use of insulin (HCC)    PLAN:    In order of problems listed above:  Essential hypertension blood pressure little elevated today I will not change anything until we will get some laboratory test.  I hope to be able to augment his therapy. Nonrheumatic aortic valve stenosis.  Last assessment showed moderate.  However with his symptomatology I will repeat echocardiogram to recheck it. Second-degree AV block status post pacemaker Saint Jude I did review interrogation normal function continue present management. Diabetes, followed by antimedicine team I did review K PN which show me hemoglobin A1c 7.1 which is a relatively good control. Dyslipidemia I did review K PN which show LDL 57 HDL 61. Cardiomyopathy  which is nonischemic.  Cardiac catheterization 06/2018 reviewed no coronary artery disease.   Medication Adjustments/Labs and Tests Ordered: Current medicines are reviewed at length with the patient today.  Concerns regarding medicines are outlined above.  No orders of the defined types were placed in this encounter.  Medication changes: No orders of the defined types were placed in this encounter.   Signed, Park Liter, MD, [facility] 12/05/2020 11:26 AM    [place]

## 2020-12-06 LAB — CBC
Hematocrit: 40.2 % (ref 34.0–46.6)
Hemoglobin: 13.7 g/dL (ref 11.1–15.9)
MCH: 29.6 pg (ref 26.6–33.0)
MCHC: 34.1 g/dL (ref 31.5–35.7)
MCV: 87 fL (ref 79–97)
Platelets: 278 10*3/uL (ref 150–450)
RBC: 4.63 x10E6/uL (ref 3.77–5.28)
RDW: 13.4 % (ref 11.7–15.4)
WBC: 7 10*3/uL (ref 3.4–10.8)

## 2020-12-06 LAB — BASIC METABOLIC PANEL
BUN/Creatinine Ratio: 13 (ref 12–28)
BUN: 22 mg/dL (ref 8–27)
CO2: 24 mmol/L (ref 20–29)
Calcium: 9.3 mg/dL (ref 8.7–10.3)
Chloride: 97 mmol/L (ref 96–106)
Creatinine, Ser: 1.76 mg/dL — ABNORMAL HIGH (ref 0.57–1.00)
Glucose: 171 mg/dL — ABNORMAL HIGH (ref 70–99)
Potassium: 5.4 mmol/L — ABNORMAL HIGH (ref 3.5–5.2)
Sodium: 139 mmol/L (ref 134–144)
eGFR: 32 mL/min/{1.73_m2} — ABNORMAL LOW (ref >59)

## 2020-12-06 LAB — PRO B NATRIURETIC PEPTIDE: NT-Pro BNP: 486 pg/mL — ABNORMAL HIGH (ref 0–301)

## 2020-12-08 ENCOUNTER — Telehealth: Payer: Self-pay | Admitting: Emergency Medicine

## 2020-12-08 DIAGNOSIS — Z79899 Other long term (current) drug therapy: Secondary | ICD-10-CM

## 2020-12-08 NOTE — Telephone Encounter (Signed)
-----   Message from Park Liter, MD sent at 12/07/2020  5:07 PM EST ----- Potassium high, please increase her furosemide to 40 in the morning 40 in afternoon, Chem-7 need to be done within the next 10 days or favorably 1 week, please ask her to avoid foods with high potassium content

## 2020-12-08 NOTE — Telephone Encounter (Signed)
Patient informed of results. She already takes lasix 20 mg twice daily. Will check with Dr. Agustin Cree for further recommendations.

## 2020-12-08 NOTE — Addendum Note (Signed)
Addended by: Senaida Ores on: 12/08/2020 05:13 PM   Modules accepted: Orders

## 2020-12-08 NOTE — Telephone Encounter (Signed)
Correction patient is already taking lasix 40 mg twice daily. Per Dr. Agustin Cree  increase lasix to 60 mg in the morning and 40 mg in the evening after 7 days have blood redrawn in 7 days and avoid foods high in potassium she understood no further questions.

## 2020-12-16 DIAGNOSIS — Z79899 Other long term (current) drug therapy: Secondary | ICD-10-CM | POA: Diagnosis not present

## 2020-12-16 LAB — BASIC METABOLIC PANEL
BUN/Creatinine Ratio: 17 (ref 12–28)
BUN: 26 mg/dL (ref 8–27)
CO2: 24 mmol/L (ref 20–29)
Calcium: 9.3 mg/dL (ref 8.7–10.3)
Chloride: 95 mmol/L — ABNORMAL LOW (ref 96–106)
Creatinine, Ser: 1.55 mg/dL — ABNORMAL HIGH (ref 0.57–1.00)
Glucose: 164 mg/dL — ABNORMAL HIGH (ref 70–99)
Potassium: 4.5 mmol/L (ref 3.5–5.2)
Sodium: 136 mmol/L (ref 134–144)
eGFR: 37 mL/min/{1.73_m2} — ABNORMAL LOW (ref >59)

## 2020-12-29 ENCOUNTER — Ambulatory Visit: Payer: Medicare Other | Admitting: Oncology

## 2021-01-04 ENCOUNTER — Ambulatory Visit (INDEPENDENT_AMBULATORY_CARE_PROVIDER_SITE_OTHER): Payer: Medicare Other

## 2021-01-04 ENCOUNTER — Other Ambulatory Visit: Payer: Self-pay

## 2021-01-04 DIAGNOSIS — I350 Nonrheumatic aortic (valve) stenosis: Secondary | ICD-10-CM | POA: Diagnosis not present

## 2021-01-04 DIAGNOSIS — I441 Atrioventricular block, second degree: Secondary | ICD-10-CM | POA: Diagnosis not present

## 2021-01-04 DIAGNOSIS — R0609 Other forms of dyspnea: Secondary | ICD-10-CM

## 2021-01-04 DIAGNOSIS — E119 Type 2 diabetes mellitus without complications: Secondary | ICD-10-CM | POA: Diagnosis not present

## 2021-01-04 DIAGNOSIS — I10 Essential (primary) hypertension: Secondary | ICD-10-CM | POA: Diagnosis not present

## 2021-01-04 DIAGNOSIS — Z950 Presence of cardiac pacemaker: Secondary | ICD-10-CM | POA: Diagnosis not present

## 2021-01-04 LAB — ECHOCARDIOGRAM COMPLETE
AR max vel: 1.1 cm2
AV Area VTI: 0.86 cm2
AV Area mean vel: 1.03 cm2
AV Mean grad: 14 mmHg
AV Peak grad: 21.3 mmHg
Ao pk vel: 2.31 m/s
Area-P 1/2: 5.34 cm2
S' Lateral: 2.8 cm

## 2021-01-04 MED ORDER — PERFLUTREN LIPID MICROSPHERE
1.0000 mL | INTRAVENOUS | Status: AC | PRN
  Administered 2021-01-04: 6 mL via INTRAVENOUS

## 2021-01-09 NOTE — Progress Notes (Signed)
[place]  [address] [place],  [place]  [address] [phone]  Clinic Day:  01/12/2021  Referring physician: Lowella Dandy, NP  This document serves as a record of services personally performed by Marice Potter, MD. It was created on their behalf by Curry,Lauren E, a trained medical scribe. The creation of this record is based on the scribe's personal observations and the provider's statements to them.  HISTORY OF PRESENT ILLNESS:  The patient is a 66 y.o. female with anemia secondary to both iron deficiency and kidney disease.  In the past, IV Feraheme was very effective in replenishing her iron stores and normalizing her hemoglobin.  She comes in today to reassess her hemoglobin.  Since her last visit, the patient has been been doing fine.  She denies having any overt forms of blood loss which concern her for recurrent anemia.  PHYSICAL EXAM:  Blood pressure (!) 193/93, pulse 66, temperature 98.6 F (37 C), resp. rate 16, height $RemoveBe'5\' 4"'lgcTKQLOy$  (1.626 m), weight (!) 321 lb 8 oz (145.8 kg), SpO2 98 %. Wt Readings from Last 3 Encounters:  01/12/21 (!) 321 lb 8 oz (145.8 kg)  12/05/20 (!) 319 lb 9.6 oz (145 kg)  06/29/20 (!) 309 lb 12.8 oz (140.5 kg)   Body mass index is 55.19 kg/m. Performance status (ECOG): 2 Physical Exam Constitutional:      Appearance: She is obese. She is not ill-appearing.  HENT:     Mouth/Throat:     Mouth: Mucous membranes are moist.     Pharynx: Oropharynx is clear. No oropharyngeal exudate or posterior oropharyngeal erythema.  Cardiovascular:     Rate and Rhythm: Normal rate and regular rhythm.     Heart sounds: No murmur heard.   No friction rub. No gallop.  Pulmonary:     Effort: Pulmonary effort is normal. No respiratory distress.     Breath sounds: Normal breath sounds. No wheezing, rhonchi or rales.  Abdominal:     General: Bowel sounds are normal. There is no distension.     Palpations: Abdomen is soft. There is  no mass.     Tenderness: There is no abdominal tenderness.  Musculoskeletal:        General: No swelling.     Right lower leg: No edema.     Left lower leg: No edema.  Lymphadenopathy:     Cervical: No cervical adenopathy.     Upper Body:     Right upper body: No supraclavicular or axillary adenopathy.     Left upper body: No supraclavicular or axillary adenopathy.     Lower Body: No right inguinal adenopathy. No left inguinal adenopathy.  Skin:    General: Skin is warm.     Coloration: Skin is not jaundiced.     Findings: No lesion or rash.  Neurological:     General: No focal deficit present.     Mental Status: She is alert and oriented to person, place, and time. Mental status is at baseline.  Psychiatric:        Mood and Affect: Mood normal.        Behavior: Behavior normal.        Thought Content: Thought content normal.    LABS:   CBC Latest Ref Rng & Units 01/12/2021 12/05/2020 06/29/2020  WBC - 6.7 7.0 8.5  Hemoglobin 12.0 - 16.0 13.5 13.7 13.2  Hematocrit 36 - 46 41 40.2 39  Platelets 150 - 399 271 278 272  CMP Latest Ref Rng & Units 01/12/2021 12/16/2020 12/05/2020  Glucose 70 - 99 mg/dL - 164(H) 171(H)  BUN 4 - 21 34(A) 26 22  Creatinine 0.5 - 1.1 1.6(A) 1.55(H) 1.76(H)  Sodium 137 - 147 134(A) 136 139  Potassium 3.4 - 5.3 4.8 4.5 5.4(H)  Chloride 99 - 108 99 95(L) 97  CO2 13 - 22 26(A) 24 24  Calcium 8.7 - 10.3 mg/dL - 9.3 9.3  Total Protein 6.0 - 8.5 g/dL - - -  Total Bilirubin 0.0 - 1.2 mg/dL - - -  Alkaline Phos 25 - 125 102 - -  AST 13 - 35 37(A) - -  ALT 7 - 35 29 - -    Latest Reference Range & Units 01/12/21 10:49  Iron 28 - 170 ug/dL 63  UIBC ug/dL 376  TIBC 250 - 450 ug/dL 439  Saturation Ratios 10.4 - 31.8 % 14  Ferritin 11 - 307 ng/mL 46   ASSESSMENT & PLAN:  Assessment/Plan:  A 66 y.o. female with anemia secondary to previous iron deficiency and renal insufficiency.  When evaluating her labs today, I remain very pleased with her hemoglobin  of 13.5.  Her iron parameters remain fine. Her renal insufficiency remained stable. Clinically, the patient appears to be doing fine.  As that is the case, I will see her back in another 6 months for repeat clinical assessment.  The patient understands all the plans discussed today and is in agreement with them.     I, Rita Ohara, am acting as scribe for Marice Potter, MD    I have reviewed this report as typed by the medical scribe, and it is complete and accurate.  Kilea Mccarey Macarthur Critchley, MD

## 2021-01-12 ENCOUNTER — Other Ambulatory Visit: Payer: Self-pay | Admitting: Oncology

## 2021-01-12 ENCOUNTER — Other Ambulatory Visit: Payer: Self-pay | Admitting: Hematology and Oncology

## 2021-01-12 ENCOUNTER — Inpatient Hospital Stay: Payer: Medicare Other

## 2021-01-12 ENCOUNTER — Other Ambulatory Visit: Payer: Self-pay

## 2021-01-12 ENCOUNTER — Inpatient Hospital Stay: Payer: Medicare Other | Attending: Oncology | Admitting: Oncology

## 2021-01-12 VITALS — BP 193/93 | HR 66 | Temp 98.6°F | Resp 16 | Ht 64.0 in | Wt 321.5 lb

## 2021-01-12 DIAGNOSIS — D508 Other iron deficiency anemias: Secondary | ICD-10-CM

## 2021-01-12 DIAGNOSIS — D631 Anemia in chronic kidney disease: Secondary | ICD-10-CM | POA: Diagnosis not present

## 2021-01-12 DIAGNOSIS — D509 Iron deficiency anemia, unspecified: Secondary | ICD-10-CM | POA: Diagnosis not present

## 2021-01-12 DIAGNOSIS — N189 Chronic kidney disease, unspecified: Secondary | ICD-10-CM

## 2021-01-12 DIAGNOSIS — D649 Anemia, unspecified: Secondary | ICD-10-CM | POA: Diagnosis not present

## 2021-01-12 LAB — CBC AND DIFFERENTIAL
HCT: 41 (ref 36–46)
Hemoglobin: 13.5 (ref 12.0–16.0)
Neutrophils Absolute: 4.36
Platelets: 271 (ref 150–399)
WBC: 6.7

## 2021-01-12 LAB — BASIC METABOLIC PANEL
BUN: 34 — AB (ref 4–21)
CO2: 26 — AB (ref 13–22)
Chloride: 99 (ref 99–108)
Creatinine: 1.6 — AB (ref 0.5–1.1)
Glucose: 185
Potassium: 4.8 (ref 3.4–5.3)
Sodium: 134 — AB (ref 137–147)

## 2021-01-12 LAB — FERRITIN: Ferritin: 46 ng/mL (ref 11–307)

## 2021-01-12 LAB — HEPATIC FUNCTION PANEL
ALT: 29 (ref 7–35)
AST: 37 — AB (ref 13–35)
Alkaline Phosphatase: 102 (ref 25–125)
Bilirubin, Total: 0.6

## 2021-01-12 LAB — CBC
MCV: 90 (ref 81–99)
RBC: 4.58 (ref 3.87–5.11)

## 2021-01-12 LAB — IRON AND TIBC
Iron: 63 ug/dL (ref 28–170)
Saturation Ratios: 14 % (ref 10.4–31.8)
TIBC: 439 ug/dL (ref 250–450)
UIBC: 376 ug/dL

## 2021-01-24 ENCOUNTER — Other Ambulatory Visit: Payer: Self-pay | Admitting: Cardiology

## 2021-02-06 DIAGNOSIS — H47293 Other optic atrophy, bilateral: Secondary | ICD-10-CM | POA: Diagnosis not present

## 2021-02-09 ENCOUNTER — Ambulatory Visit (INDEPENDENT_AMBULATORY_CARE_PROVIDER_SITE_OTHER): Payer: Medicare Other

## 2021-02-09 DIAGNOSIS — I441 Atrioventricular block, second degree: Secondary | ICD-10-CM

## 2021-02-09 LAB — CUP PACEART REMOTE DEVICE CHECK
Battery Remaining Longevity: 85 mo
Battery Remaining Percentage: 80 %
Battery Voltage: 3.01 V
Brady Statistic AP VP Percent: 7.9 %
Brady Statistic AP VS Percent: 27 %
Brady Statistic AS VP Percent: 32 %
Brady Statistic AS VS Percent: 33 %
Brady Statistic RA Percent Paced: 35 %
Brady Statistic RV Percent Paced: 39 %
Date Time Interrogation Session: 20230209020015
Implantable Lead Implant Date: 20200814
Implantable Lead Implant Date: 20200814
Implantable Lead Location: 753859
Implantable Lead Location: 753860
Implantable Pulse Generator Implant Date: 20200814
Lead Channel Impedance Value: 430 Ohm
Lead Channel Impedance Value: 440 Ohm
Lead Channel Pacing Threshold Amplitude: 0.5 V
Lead Channel Pacing Threshold Amplitude: 1 V
Lead Channel Pacing Threshold Pulse Width: 0.5 ms
Lead Channel Pacing Threshold Pulse Width: 0.5 ms
Lead Channel Sensing Intrinsic Amplitude: 2.4 mV
Lead Channel Sensing Intrinsic Amplitude: 6.7 mV
Lead Channel Setting Pacing Amplitude: 2.5 V
Lead Channel Setting Pacing Amplitude: 2.5 V
Lead Channel Setting Pacing Pulse Width: 0.5 ms
Lead Channel Setting Sensing Sensitivity: 2 mV
Pulse Gen Model: 2272
Pulse Gen Serial Number: 9154171

## 2021-02-13 ENCOUNTER — Other Ambulatory Visit: Payer: Self-pay | Admitting: Cardiology

## 2021-02-14 NOTE — Progress Notes (Signed)
Remote pacemaker transmission.   

## 2021-03-02 DIAGNOSIS — D539 Nutritional anemia, unspecified: Secondary | ICD-10-CM | POA: Diagnosis not present

## 2021-03-02 DIAGNOSIS — E785 Hyperlipidemia, unspecified: Secondary | ICD-10-CM | POA: Diagnosis not present

## 2021-03-02 DIAGNOSIS — E039 Hypothyroidism, unspecified: Secondary | ICD-10-CM | POA: Diagnosis not present

## 2021-03-02 DIAGNOSIS — M1990 Unspecified osteoarthritis, unspecified site: Secondary | ICD-10-CM | POA: Diagnosis not present

## 2021-03-02 DIAGNOSIS — Z79891 Long term (current) use of opiate analgesic: Secondary | ICD-10-CM | POA: Diagnosis not present

## 2021-03-02 DIAGNOSIS — K219 Gastro-esophageal reflux disease without esophagitis: Secondary | ICD-10-CM | POA: Diagnosis not present

## 2021-03-02 DIAGNOSIS — N1831 Chronic kidney disease, stage 3a: Secondary | ICD-10-CM | POA: Diagnosis not present

## 2021-03-02 DIAGNOSIS — E1122 Type 2 diabetes mellitus with diabetic chronic kidney disease: Secondary | ICD-10-CM | POA: Diagnosis not present

## 2021-03-02 DIAGNOSIS — Z950 Presence of cardiac pacemaker: Secondary | ICD-10-CM | POA: Diagnosis not present

## 2021-03-02 DIAGNOSIS — I10 Essential (primary) hypertension: Secondary | ICD-10-CM | POA: Diagnosis not present

## 2021-03-02 DIAGNOSIS — Z139 Encounter for screening, unspecified: Secondary | ICD-10-CM | POA: Diagnosis not present

## 2021-03-02 DIAGNOSIS — R300 Dysuria: Secondary | ICD-10-CM | POA: Diagnosis not present

## 2021-03-22 ENCOUNTER — Other Ambulatory Visit: Payer: Self-pay | Admitting: Cardiology

## 2021-03-24 DIAGNOSIS — E669 Obesity, unspecified: Secondary | ICD-10-CM | POA: Diagnosis not present

## 2021-03-24 DIAGNOSIS — Z9181 History of falling: Secondary | ICD-10-CM | POA: Diagnosis not present

## 2021-03-24 DIAGNOSIS — E785 Hyperlipidemia, unspecified: Secondary | ICD-10-CM | POA: Diagnosis not present

## 2021-03-24 DIAGNOSIS — Z0000 Encounter for general adult medical examination without abnormal findings: Secondary | ICD-10-CM | POA: Diagnosis not present

## 2021-03-24 DIAGNOSIS — Z6841 Body Mass Index (BMI) 40.0 and over, adult: Secondary | ICD-10-CM | POA: Diagnosis not present

## 2021-03-24 DIAGNOSIS — Z1331 Encounter for screening for depression: Secondary | ICD-10-CM | POA: Diagnosis not present

## 2021-04-24 ENCOUNTER — Encounter: Payer: Self-pay | Admitting: Cardiology

## 2021-04-24 ENCOUNTER — Ambulatory Visit (INDEPENDENT_AMBULATORY_CARE_PROVIDER_SITE_OTHER): Payer: Medicare Other | Admitting: Cardiology

## 2021-04-24 VITALS — BP 124/78 | HR 65 | Ht 64.0 in

## 2021-04-24 DIAGNOSIS — I441 Atrioventricular block, second degree: Secondary | ICD-10-CM

## 2021-04-24 NOTE — Patient Instructions (Signed)
Medication Instructions:  ?Your physician recommends that you continue on your current medications as directed. Please refer to the Current Medication list given to you today. ? ?*If you need a refill on your cardiac medications before your next appointment, please call your pharmacy* ? ? ?Lab Work: ?None ordered ? ? ?Testing/Procedures: ?None ordered ? ? ?Follow-Up: ?At [facility], you and your health needs are our priority.  As part of our continuing mission to provide you with exceptional heart care, we have created designated Provider Care Teams.  These Care Teams include your primary Cardiologist (physician) and Advanced Practice Providers (APPs -  Physician Assistants and Nurse Practitioners) who all work together to provide you with the care you need, when you need it. ? ?Remote monitoring is used to monitor your Pacemaker or ICD from home. This monitoring reduces the number of office visits required to check your device to one time per year. It allows [place] to keep an eye on the functioning of your device to ensure it is working properly. You are scheduled for a device check from home on 05/11/2021. You may send your transmission at any time that day. If you have a wireless device, the transmission will be sent automatically. After your physician reviews your transmission, you will receive a postcard with your next transmission date. ? ?Your next appointment:   ?1 year(s) ? ?The format for your next appointment:   ?In Person ? ?Provider:   ?Allegra Lai, MD ? ? ?Thank you for choosing [facility]!! ? ? ?Vickie Curet, RN ?([phone] ? ?

## 2021-04-24 NOTE — Progress Notes (Signed)
? ?Electrophysiology Office Note ? ? ?Date:  04/24/2021  ? ?ID:  Vickie Ferguson, DOB [birth year], MRN [id number] ? ?PCP:  Lowella Dandy, NP  ?Cardiologist: Agustin Cree ?Primary Electrophysiologist:  Shatoya Roets Meredith Leeds, MD   ? ?No chief complaint on file. ? ?  ?History of Present Illness: ?Vickie Ferguson is a 66 y.o. female who is being seen today for the evaluation of heart block at the request of Moon, Amy A, NP. Presenting today for electrophysiology evaluation.   ? ?She has a history significant for hypertension, diabetes, obstructive sleep apnea.  She wore a heart monitor that showed multiple episodes of bradycardia in 2-1 AV block.  She is status post Saint Jude dual-chamber pacemaker implanted 08/15/2018. ? ?Today, denies symptoms of palpitations, chest pain, shortness of breath, orthopnea, PND, lower extremity edema, claudication, dizziness, presyncope, syncope, bleeding, or neurologic sequela. The patient is tolerating medications without difficulties.  From a cardiac standpoint she is doing well.  She has no chest pain or shortness of breath.  She feels that she is not restricted at all from her heart.  She is no longer having palpitations as her PMT algorithm was changed. ? ?Past Medical History:  ?Diagnosis Date  ? Anemia   ? Anginal pain ([place])   ? Aortic stenosis 03/21/2020  ? Arthritis   ? CKD (chronic kidney disease), stage III ([place])   ? Complication of anesthesia   ? Congestive heart failure (CHF) ([place]) 06/18/2019  ? Diabetes mellitus without complication ([place])   ? Essential hypertension 07/23/2014  ? Family history of adverse reaction to anesthesia   ? " FAMILY MEMBERS HAD DIFFICULTY WAKING "  ? Hypertension   ? Hypothyroidism   ? Iron deficiency anemia 07/23/2014  ? Long term current use of insulin ([place]) 01/07/2017  ? Morbid (severe) obesity due to excess calories ([place]) 07/23/2014  ? OSA (obstructive sleep apnea) 04/26/2017  ? Other long term (current) drug therapy 01/07/2017  ? Pacemaker [place] Jude  device 09/01/2018  ? Palpitations 11/19/2019  ? PONV (postoperative nausea and vomiting)   ? Precordial pain 07/23/2014  ? Presence of permanent cardiac pacemaker 08/15/2018  ? St Jude Medical Assurity MRI dual-chamber pacemaker for symptomatic bradycardia  ? Pressure injury of skin 06/17/2018  ? Second degree AV block   ? Shortness of breath 04/26/2017  ? TIA (transient ischemic attack) 04/26/2017  ? Type 2 diabetes mellitus without complication ([place]) 3/73/4287  ? Unstable angina ([place]) 06/17/2018  ? Ventricular tachycardia (paroxysmal) ([place]) 07/23/2014  ? ?Past Surgical History:  ?Procedure Laterality Date  ? CARDIAC CATHETERIZATION    ? INSERT / REPLACE / REMOVE PACEMAKER  08/15/2018  ? St Jude Medical Assurity MRI dual-chamber pacemaker for symptomatic bradycardia  ? LEFT HEART CATH AND CORONARY ANGIOGRAPHY N/A 06/17/2018  ? Procedure: LEFT HEART CATH AND CORONARY ANGIOGRAPHY;  Surgeon: [place], Peter M, MD;  Location: [place] CV LAB;  Service: Cardiovascular;  Laterality: N/A;  ? PACEMAKER IMPLANT N/A 08/15/2018  ? Procedure: PACEMAKER IMPLANT;  Surgeon: Constance Haw, MD;  Location: [place] CV LAB;  Service: Cardiovascular;  Laterality: N/A;  ? SHOULDER SURGERY Right 2012  ? METAL PLATE   ? TOTAL KNEE ARTHROPLASTY Right 2001  ? ? ? ?Current Outpatient Medications  ?Medication Sig Dispense Refill  ? acetaminophen (TYLENOL) 500 MG tablet Take 500 mg by mouth every 6 (six) hours as needed for mild pain or headache.    ? atorvastatin (LIPITOR) 40 MG tablet Take 1 tablet (40  mg total) by mouth daily. 90 tablet 2  ? clopidogrel (PLAVIX) 75 MG tablet TAKE 1 TABLET BY MOUTH ONCE DAILY. (Patient taking differently: Take 75 mg by mouth daily.) 90 tablet 2  ? dexlansoprazole (DEXILANT) 60 MG capsule Take 60 mg by mouth daily.     ? fluticasone (FLONASE) 50 MCG/ACT nasal spray Place 1 spray into both nostrils daily as needed for allergies. For allergies    ? furosemide (LASIX) 40 MG tablet Take 40 mg by mouth 2 (two)  times daily. IF GAINS 2 POUNDS IN 3 DAYS THEN TAKE EXTRA $RemoveBe'20MG'EKgbKnjmf$  IN EVENING.    ? levothyroxine (SYNTHROID, LEVOTHROID) 75 MCG tablet Take 75 mcg by mouth daily before breakfast.     ? metFORMIN (GLUCOPHAGE) 500 MG tablet Take 500 mg by mouth 2 (two) times daily with a meal.    ? metoprolol tartrate (LOPRESSOR) 50 MG tablet TAKE 1 TABLET BY MOUTH TWICE DAILY. 180 tablet 1  ? nitroGLYCERIN (NITROSTAT) 0.4 MG SL tablet Place 1 tablet (0.4 mg total) under the tongue every 5 (five) minutes as needed. (Patient taking differently: Place 0.4 mg under the tongue every 5 (five) minutes as needed for chest pain.) 75 tablet 2  ? Oxycodone HCl 10 MG TABS Take 1 tablet by mouth as needed (pain).    ? polyvinyl alcohol (LIQUIFILM TEARS) 1.4 % ophthalmic solution Place 1 drop into both eyes as needed for dry eyes.    ? ?No current facility-administered medications for this visit.  ? ? ?Allergies:   Aleve [naproxen sodium], Procainamide, Rythmol [propafenone hcl], Codeine, Doxycycline, Isosorbide nitrate, and Sertraline  ? ?Social History:  The patient  reports that she has never smoked. She has never used smokeless tobacco. She reports that she does not drink alcohol and does not use drugs.  ? ?Family History:  [name] family history includes Heart disease in her mother.  ? ?ROS:  Please see the history of present illness.   Otherwise, review of systems is positive for none.   All other systems are reviewed and negative.  ? ?PHYSICAL EXAM: ?VS:  BP 124/78   Pulse 65   Ht $R'5\' 4"'tm$  (1.626 m)   SpO2 97%   BMI 55.19 kg/m?  , BMI Body mass index is 55.19 kg/m?. ?GEN: Well nourished, well developed, in no acute distress  ?HEENT: normal  ?Neck: no JVD, carotid bruits, or masses ?Cardiac: RRR; no murmurs, rubs, or gallops,no edema  ?Respiratory:  clear to auscultation bilaterally, normal work of breathing ?GI: soft, nontender, nondistended, + BS ?MS: no deformity or atrophy  ?Skin: warm and dry, device site well healed ?Neuro:   Strength and sensation are intact ?Psych: euthymic mood, full affect ? ?EKG:  EKG is ordered today. ?Personal review of the ekg ordered shows A sense, V pace ? ?Personal review of the device interrogation today. Results in [place]  ? ?Recent Labs: ?12/05/2020: NT-Pro BNP 486 ?01/12/2021: ALT 29; BUN 34; Creatinine 1.6; Hemoglobin 13.5; Platelets 271; Potassium 4.8; Sodium 134  ? ? ?Lipid Panel  ?No results found for: CHOL, TRIG, HDL, CHOLHDL, VLDL, LDLCALC, LDLDIRECT ? ? ?Wt Readings from Last 3 Encounters:  ?01/12/21 (!) 321 lb 8 oz (145.8 kg)  ?12/05/20 (!) 319 lb 9.6 oz (145 kg)  ?06/29/20 (!) 309 lb 12.8 oz (140.5 kg)  ?  ? ? ?Other studies Reviewed: ?Additional studies/ records that were reviewed today include: TTE 06/10/18  ?Review of the above records today demonstrates:  ? 1. The left ventricle has normal  systolic function, with an ejection fraction of 55-60%. The cavity size was normal. Left ventricular diastolic function could not be evaluated. ? 2. The right ventricle has normal systolic function. The cavity was normal. There is no increase in right ventricular wall thickness. ? 3. Mitral valve regurgitation is mild to moderate by color flow Doppler. ? 4. The tricuspid valve is grossly normal. ? 5. The aortic valve is grossly normal. Moderate sclerosis of the aortic valve. Aortic valve regurgitation is trivial by color flow Doppler. ? ?[place] 06/17/18 ?LV end diastolic pressure is normal. ?  ?1. Normal coronary anatomy ?2. Normal LVEDP ? ?Cardiac monitor 07/13/2018 personally reviewed ?Baseline rhythm: Sinus rhythm with intraventricular conduction delay ?Minimum heart rate: 38 BPM.  Average heart rate: 66 BPM.  Maximal heart rate 92 BPM. ?Conduction abnormality: Multiple episodes of 2-1 AV conduction, as well as second-degree type II AV block.  ?Symptoms: Multiple symptomatology many times related to AV block. ? ? ?ASSESSMENT AND PLAN: ? ?1.  Second-degree AV block: Status post Saint Jude dual-chamber pacemaker  implanted 08/15/2018.  Device functioning appropriately.  No changes at this time. ? ?2.  Hypertension: Currently well controlled ? ? ?Current medicines are reviewed at length with the patient today.   ?The patient

## 2021-05-05 ENCOUNTER — Encounter: Payer: Self-pay | Admitting: Cardiology

## 2021-05-05 ENCOUNTER — Ambulatory Visit (INDEPENDENT_AMBULATORY_CARE_PROVIDER_SITE_OTHER): Payer: Medicare Other | Admitting: Cardiology

## 2021-05-05 VITALS — BP 144/86 | HR 68 | Ht 64.0 in | Wt 317.6 lb

## 2021-05-05 DIAGNOSIS — I4729 Other ventricular tachycardia: Secondary | ICD-10-CM

## 2021-05-05 DIAGNOSIS — I10 Essential (primary) hypertension: Secondary | ICD-10-CM | POA: Diagnosis not present

## 2021-05-05 DIAGNOSIS — I441 Atrioventricular block, second degree: Secondary | ICD-10-CM | POA: Diagnosis not present

## 2021-05-05 DIAGNOSIS — I5032 Chronic diastolic (congestive) heart failure: Secondary | ICD-10-CM

## 2021-05-05 DIAGNOSIS — I350 Nonrheumatic aortic (valve) stenosis: Secondary | ICD-10-CM | POA: Diagnosis not present

## 2021-05-05 NOTE — Patient Instructions (Signed)
Medication Instructions:  ?Your physician recommends that you continue on your current medications as directed. Please refer to the Current Medication list given to you today.  ?*If you need a refill on your cardiac medications before your next appointment, please call your pharmacy* ? ? ?Lab Work: ?BMP, ProBNP Today ?If you have labs (blood work) drawn today and your tests are completely normal, you will receive your results only by: ?MyChart Message (if you have MyChart) OR ?A paper copy in the mail ?If you have any lab test that is abnormal or we need to change your treatment, we will call you to review the results. ? ? ?Testing/Procedures: ?Your physician has requested that you have an echocardiogram. Echocardiography is a painless test that uses sound waves to create images of your heart. It provides your doctor with information about the size and shape of your heart and how well your heart?s chambers and valves are working. This procedure takes approximately one hour. There are no restrictions for this procedure.  ? ? ?Follow-Up: ?At [facility], you and your health needs are our priority.  As part of our continuing mission to provide you with exceptional heart care, we have created designated Provider Care Teams.  These Care Teams include your primary Cardiologist (physician) and Advanced Practice Providers (APPs -  Physician Assistants and Nurse Practitioners) who all work together to provide you with the care you need, when you need it. ? ?We recommend signing up for the patient portal called "MyChart".  Sign up information is provided on this After Visit Summary.  MyChart is used to connect with patients for Virtual Visits (Telemedicine).  Patients are able to view lab/test results, encounter notes, upcoming appointments, etc.  Non-urgent messages can be sent to your provider as well.   ?To learn more about what you can do with MyChart, go to [email or web address].   ? ?Your next appointment:   ?5  month(s) ? ?The format for your next appointment:   ?In Person ? ?Provider:   ?Jenne Campus, MD  ? ? ?Other Instructions ?NA  ?

## 2021-05-05 NOTE — Addendum Note (Signed)
Addended by: Jacobo Forest D on: 05/05/2021 10:16 AM ? ? Modules accepted: Orders ? ?

## 2021-05-05 NOTE — Progress Notes (Signed)
?Cardiology Office Note:   ? ?Date:  05/05/2021  ? ?ID:  Vickie Ferguson, DOB 1955-06-27, MRN [id number] ? ?PCP:  Lowella Dandy, NP  ?Cardiologist:  Jenne Campus, MD   ? ?Referring MD: Lowella Dandy, NP  ? ?Chief Complaint  ?Patient presents with  ? Shortness of Breath  ? ? ?History of Present Illness:   ? ?Vickie Ferguson is a 66 y.o. female with past medical history significant for diastolic congestive heart failure, essential hypertension, morbid obesity, dyslipidemia, aortic stenosis which is moderate to severe, ejection fraction 40 to 45%, ventricular ectopy. ?She is in my office today to talk about her issues complain of having some more shortness of breath she blames this on pollen denies have any chest pain tightness squeezing pressure mid chest, palpitations as usually. ? ?Past Medical History:  ?Diagnosis Date  ? Anemia   ? Anginal pain ([place])   ? Aortic stenosis 03/21/2020  ? Arthritis   ? CKD (chronic kidney disease), stage III ([place])   ? Complication of anesthesia   ? Congestive heart failure (CHF) ([place]) 06/18/2019  ? Diabetes mellitus without complication ([place])   ? Essential hypertension 07/23/2014  ? Family history of adverse reaction to anesthesia   ? " FAMILY MEMBERS HAD DIFFICULTY WAKING "  ? Hypertension   ? Hypothyroidism   ? Iron deficiency anemia 07/23/2014  ? Long term current use of insulin ([place]) 01/07/2017  ? Morbid (severe) obesity due to excess calories ([place]) 07/23/2014  ? OSA (obstructive sleep apnea) 04/26/2017  ? Other long term (current) drug therapy 01/07/2017  ? Pacemaker [place] Jude device 09/01/2018  ? Palpitations 11/19/2019  ? PONV (postoperative nausea and vomiting)   ? Precordial pain 07/23/2014  ? Presence of permanent cardiac pacemaker 08/15/2018  ? St Jude Medical Assurity MRI dual-chamber pacemaker for symptomatic bradycardia  ? Pressure injury of skin 06/17/2018  ? Second degree AV block   ? Shortness of breath 04/26/2017  ? TIA (transient ischemic attack) 04/26/2017  ? Type 2  diabetes mellitus without complication ([place]) 04/09/8117  ? Unstable angina ([place]) 06/17/2018  ? Ventricular tachycardia (paroxysmal) ([place]) 07/23/2014  ? ? ?Past Surgical History:  ?Procedure Laterality Date  ? CARDIAC CATHETERIZATION    ? INSERT / REPLACE / REMOVE PACEMAKER  08/15/2018  ? St Jude Medical Assurity MRI dual-chamber pacemaker for symptomatic bradycardia  ? LEFT HEART CATH AND CORONARY ANGIOGRAPHY N/A 06/17/2018  ? Procedure: LEFT HEART CATH AND CORONARY ANGIOGRAPHY;  Surgeon: Martinique, Peter M, MD;  Location: [place] [facility];  Service: Cardiovascular;  Laterality: N/A;  ? PACEMAKER IMPLANT N/A 08/15/2018  ? Procedure: PACEMAKER IMPLANT;  Surgeon: Constance Haw, MD;  Location: [place] [facility];  Service: Cardiovascular;  Laterality: N/A;  ? SHOULDER SURGERY Right 2012  ? METAL PLATE   ? TOTAL KNEE ARTHROPLASTY Right 2001  ? ? ?Current Medications: ?Current Meds  ?Medication Sig  ? acetaminophen (TYLENOL) 500 MG tablet Take 500 mg by mouth every 6 (six) hours as needed for mild pain or headache.  ? atorvastatin (LIPITOR) 40 MG tablet Take 1 tablet (40 mg total) by mouth daily.  ? clopidogrel (PLAVIX) 75 MG tablet TAKE 1 TABLET BY MOUTH ONCE DAILY. (Patient taking differently: Take 75 mg by mouth daily.)  ? dexlansoprazole (DEXILANT) 60 MG capsule Take 60 mg by mouth daily.   ? fluticasone (FLONASE) 50 MCG/ACT nasal spray Place 1 spray into both nostrils daily as needed for allergies. For allergies  ?  furosemide (LASIX) 40 MG tablet Take 40 mg by mouth 2 (two) times daily. IF GAINS 2 POUNDS IN 3 DAYS THEN TAKE EXTRA $RemoveBe'20MG'PCMYVbBEe$  IN EVENING.  ? levothyroxine (SYNTHROID, LEVOTHROID) 75 MCG tablet Take 75 mcg by mouth daily before breakfast.   ? metFORMIN (GLUCOPHAGE) 500 MG tablet Take 500 mg by mouth 2 (two) times daily with a meal.  ? metoprolol tartrate (LOPRESSOR) 50 MG tablet TAKE 1 TABLET BY MOUTH TWICE DAILY. (Patient taking differently: Take 50 mg by mouth 2 (two) times daily.)  ? nitroGLYCERIN  (NITROSTAT) 0.4 MG SL tablet Place 1 tablet (0.4 mg total) under the tongue every 5 (five) minutes as needed. (Patient taking differently: Place 0.4 mg under the tongue every 5 (five) minutes as needed for chest pain.)  ? Oxycodone HCl 10 MG TABS Take 1 tablet by mouth as needed (pain).  ? polyvinyl alcohol (LIQUIFILM TEARS) 1.4 % ophthalmic solution Place 1 drop into both eyes as needed for dry eyes.  ?  ? ?Allergies:   Aleve [naproxen sodium], Procainamide, Rythmol [propafenone hcl], Codeine, Doxycycline, Isosorbide nitrate, and Sertraline  ? ?Social History  ? ?Socioeconomic History  ? Marital status: Legally Separated  ?  Spouse name: Not on file  ? Number of children: Not on file  ? Years of education: Not on file  ? Highest education level: Not on file  ?Occupational History  ? Not on file  ?Tobacco Use  ? Smoking status: Never  ? Smokeless tobacco: Never  ?Vaping Use  ? Vaping Use: Never used  ?Substance and Sexual Activity  ? Alcohol use: No  ?  Alcohol/week: 0.0 standard drinks  ? Drug use: No  ? Sexual activity: Not on file  ?Other Topics Concern  ? Not on file  ?Social History Narrative  ? Not on file  ? ?Social Determinants of Health  ? ?Financial Resource Strain: Not on file  ?Food Insecurity: Not on file  ?Transportation Needs: Not on file  ?Physical Activity: Not on file  ?Stress: Not on file  ?Social Connections: Not on file  ?  ? ?Family History: ?[name] family history includes Heart disease in her mother. ?ROS:   ?Please see the history of present illness.    ?All 14 point review of systems negative except as described per history of present illness ? ?EKGs/Labs/Other Studies Reviewed:   ? ? ? ?Recent Labs: ?12/05/2020: NT-Pro BNP 486 ?01/12/2021: ALT 29; BUN 34; Creatinine 1.6; Hemoglobin 13.5; Platelets 271; Potassium 4.8; Sodium 134  ?Recent Lipid Panel ?No results found for: CHOL, TRIG, HDL, CHOLHDL, VLDL, LDLCALC, LDLDIRECT ? ?Physical Exam:   ? ?VS:  BP (!) 144/86 (BP Location: Left Arm,  Patient Position: Sitting)   Pulse 68   Ht $R'5\' 4"'qX$  (1.626 m)   Wt (!) 317 lb 9.6 oz (144.1 kg)   SpO2 98%   BMI 54.52 kg/m?    ? ?Wt Readings from Last 3 Encounters:  ?05/05/21 (!) 317 lb 9.6 oz (144.1 kg)  ?01/12/21 (!) 321 lb 8 oz (145.8 kg)  ?12/05/20 (!) 319 lb 9.6 oz (145 kg)  ?  ? ?GEN:  Well nourished, well developed in no acute distress ?HEENT: Normal ?NECK: No JVD; No carotid bruits ?LYMPHATICS: No lymphadenopathy ?CARDIAC: RRR, systolic ejection murmur grade 2/6 best heard right upper portion of the sternum, no rubs, no gallops ?RESPIRATORY:  Clear to auscultation without rales, wheezing or rhonchi  ?ABDOMEN: Soft, non-tender, non-distended ?MUSCULOSKELETAL:  No edema; No deformity  ?SKIN: Warm and dry ?LOWER  EXTREMITIES: no swelling ?NEUROLOGIC:  Alert and oriented x 3 ?PSYCHIATRIC:  Normal affect  ? ?ASSESSMENT:   ? ?1. Nonrheumatic aortic valve stenosis   ?2. Essential hypertension   ?3. Ventricular tachycardia (paroxysmal) ([place])   ?4. Second degree AV block   ?5. Chronic diastolic congestive heart failure ([place])   ? ?PLAN:   ? ?In order of problems listed above: ? ?Nonrheumatic aortic valve stenosis which is borderline significant.  I will ask her to have another echocardiogram scheduled to clarify that.  She does have a little more short of breath sublaminar pollen which could be the case but have to make sure its not related to her heart. ?Essential hypertension left seems to be well controlled continue present management.  It is always still be elevated in the office but at home it is always good ?Ventricular tachycardia denies having any. ?Pacemaker present which is an Abbott device, normal function 6.5 years left in the device at least ?I will ask her to have proBNP done today as well as Chem-7 trying to figure out if any symptomatology is related to her heart ? ? ?Medication Adjustments/Labs and Tests Ordered: ?Current medicines are reviewed at length with the patient today.  Concerns regarding  medicines are outlined above.  ?No orders of the defined types were placed in this encounter. ? ?Medication changes: No orders of the defined types were placed in this encounter. ? ? ?Signed, ?Park Liter, M

## 2021-05-06 LAB — BASIC METABOLIC PANEL
BUN/Creatinine Ratio: 17 (ref 12–28)
BUN: 27 mg/dL (ref 8–27)
CO2: 23 mmol/L (ref 20–29)
Calcium: 9.7 mg/dL (ref 8.7–10.3)
Chloride: 94 mmol/L — ABNORMAL LOW (ref 96–106)
Creatinine, Ser: 1.63 mg/dL — ABNORMAL HIGH (ref 0.57–1.00)
Glucose: 193 mg/dL — ABNORMAL HIGH (ref 70–99)
Potassium: 5.1 mmol/L (ref 3.5–5.2)
Sodium: 133 mmol/L — ABNORMAL LOW (ref 134–144)
eGFR: 35 mL/min/{1.73_m2} — ABNORMAL LOW (ref >59)

## 2021-05-06 LAB — PRO B NATRIURETIC PEPTIDE: NT-Pro BNP: 425 pg/mL — ABNORMAL HIGH (ref 0–301)

## 2021-05-10 ENCOUNTER — Telehealth: Payer: Self-pay | Admitting: Cardiology

## 2021-05-10 NOTE — Telephone Encounter (Signed)
Patient is returning call to discuss lab results. 

## 2021-05-10 NOTE — Telephone Encounter (Signed)
Patient informed of results.  

## 2021-05-11 ENCOUNTER — Ambulatory Visit (INDEPENDENT_AMBULATORY_CARE_PROVIDER_SITE_OTHER): Payer: Medicare Other

## 2021-05-11 DIAGNOSIS — I441 Atrioventricular block, second degree: Secondary | ICD-10-CM

## 2021-05-11 LAB — CUP PACEART REMOTE DEVICE CHECK
Battery Remaining Longevity: 74 mo
Battery Remaining Percentage: 77 %
Battery Voltage: 3.01 V
Brady Statistic AP VP Percent: 41 %
Brady Statistic AP VS Percent: 1 %
Brady Statistic AS VP Percent: 59 %
Brady Statistic AS VS Percent: 1 %
Brady Statistic RA Percent Paced: 41 %
Brady Statistic RV Percent Paced: 99 %
Date Time Interrogation Session: 20230511035627
Implantable Lead Implant Date: 20200814
Implantable Lead Implant Date: 20200814
Implantable Lead Location: 753859
Implantable Lead Location: 753860
Implantable Pulse Generator Implant Date: 20200814
Lead Channel Impedance Value: 430 Ohm
Lead Channel Impedance Value: 430 Ohm
Lead Channel Pacing Threshold Amplitude: 0.5 V
Lead Channel Pacing Threshold Amplitude: 1 V
Lead Channel Pacing Threshold Pulse Width: 0.5 ms
Lead Channel Pacing Threshold Pulse Width: 0.5 ms
Lead Channel Sensing Intrinsic Amplitude: 2.3 mV
Lead Channel Sensing Intrinsic Amplitude: 7 mV
Lead Channel Setting Pacing Amplitude: 2.5 V
Lead Channel Setting Pacing Amplitude: 2.5 V
Lead Channel Setting Pacing Pulse Width: 0.5 ms
Lead Channel Setting Sensing Sensitivity: 2 mV
Pulse Gen Model: 2272
Pulse Gen Serial Number: 9154171

## 2021-05-18 NOTE — Progress Notes (Signed)
Remote pacemaker transmission.   

## 2021-05-22 ENCOUNTER — Other Ambulatory Visit: Payer: Self-pay | Admitting: Cardiology

## 2021-05-22 NOTE — Telephone Encounter (Signed)
Rx refill sent to pharmacy. 

## 2021-06-02 DIAGNOSIS — E039 Hypothyroidism, unspecified: Secondary | ICD-10-CM | POA: Diagnosis not present

## 2021-06-02 DIAGNOSIS — E785 Hyperlipidemia, unspecified: Secondary | ICD-10-CM | POA: Diagnosis not present

## 2021-06-02 DIAGNOSIS — E1122 Type 2 diabetes mellitus with diabetic chronic kidney disease: Secondary | ICD-10-CM | POA: Diagnosis not present

## 2021-06-02 DIAGNOSIS — R600 Localized edema: Secondary | ICD-10-CM | POA: Diagnosis not present

## 2021-06-02 DIAGNOSIS — K219 Gastro-esophageal reflux disease without esophagitis: Secondary | ICD-10-CM | POA: Diagnosis not present

## 2021-06-02 DIAGNOSIS — N1831 Chronic kidney disease, stage 3a: Secondary | ICD-10-CM | POA: Diagnosis not present

## 2021-06-02 DIAGNOSIS — F331 Major depressive disorder, recurrent, moderate: Secondary | ICD-10-CM | POA: Diagnosis not present

## 2021-06-02 DIAGNOSIS — Z139 Encounter for screening, unspecified: Secondary | ICD-10-CM | POA: Diagnosis not present

## 2021-06-02 DIAGNOSIS — D539 Nutritional anemia, unspecified: Secondary | ICD-10-CM | POA: Diagnosis not present

## 2021-06-02 DIAGNOSIS — I10 Essential (primary) hypertension: Secondary | ICD-10-CM | POA: Diagnosis not present

## 2021-06-02 DIAGNOSIS — M1990 Unspecified osteoarthritis, unspecified site: Secondary | ICD-10-CM | POA: Diagnosis not present

## 2021-06-02 DIAGNOSIS — Z950 Presence of cardiac pacemaker: Secondary | ICD-10-CM | POA: Diagnosis not present

## 2021-06-25 ENCOUNTER — Other Ambulatory Visit: Payer: Self-pay | Admitting: Cardiology

## 2021-06-26 ENCOUNTER — Other Ambulatory Visit: Payer: Medicare Other

## 2021-08-10 ENCOUNTER — Ambulatory Visit (INDEPENDENT_AMBULATORY_CARE_PROVIDER_SITE_OTHER): Payer: Medicare Other

## 2021-08-10 DIAGNOSIS — I441 Atrioventricular block, second degree: Secondary | ICD-10-CM | POA: Diagnosis not present

## 2021-08-10 LAB — CUP PACEART REMOTE DEVICE CHECK
Battery Remaining Longevity: 71 mo
Battery Remaining Percentage: 74 %
Battery Voltage: 3.01 V
Brady Statistic AP VP Percent: 38 %
Brady Statistic AP VS Percent: 1 %
Brady Statistic AS VP Percent: 62 %
Brady Statistic AS VS Percent: 1 %
Brady Statistic RA Percent Paced: 38 %
Brady Statistic RV Percent Paced: 99 %
Date Time Interrogation Session: 20230810044309
Implantable Lead Implant Date: 20200814
Implantable Lead Implant Date: 20200814
Implantable Lead Location: 753859
Implantable Lead Location: 753860
Implantable Pulse Generator Implant Date: 20200814
Lead Channel Impedance Value: 410 Ohm
Lead Channel Impedance Value: 410 Ohm
Lead Channel Pacing Threshold Amplitude: 0.5 V
Lead Channel Pacing Threshold Amplitude: 1 V
Lead Channel Pacing Threshold Pulse Width: 0.5 ms
Lead Channel Pacing Threshold Pulse Width: 0.5 ms
Lead Channel Sensing Intrinsic Amplitude: 1.6 mV
Lead Channel Sensing Intrinsic Amplitude: 6.4 mV
Lead Channel Setting Pacing Amplitude: 2.5 V
Lead Channel Setting Pacing Amplitude: 2.5 V
Lead Channel Setting Pacing Pulse Width: 0.5 ms
Lead Channel Setting Sensing Sensitivity: 2 mV
Pulse Gen Model: 2272
Pulse Gen Serial Number: 9154171

## 2021-09-05 NOTE — Progress Notes (Signed)
Remote pacemaker transmission.   

## 2021-09-07 DIAGNOSIS — N1831 Chronic kidney disease, stage 3a: Secondary | ICD-10-CM | POA: Diagnosis not present

## 2021-09-07 DIAGNOSIS — R600 Localized edema: Secondary | ICD-10-CM | POA: Diagnosis not present

## 2021-09-07 DIAGNOSIS — D539 Nutritional anemia, unspecified: Secondary | ICD-10-CM | POA: Diagnosis not present

## 2021-09-07 DIAGNOSIS — K219 Gastro-esophageal reflux disease without esophagitis: Secondary | ICD-10-CM | POA: Diagnosis not present

## 2021-09-07 DIAGNOSIS — M1990 Unspecified osteoarthritis, unspecified site: Secondary | ICD-10-CM | POA: Diagnosis not present

## 2021-09-07 DIAGNOSIS — E1122 Type 2 diabetes mellitus with diabetic chronic kidney disease: Secondary | ICD-10-CM | POA: Diagnosis not present

## 2021-09-07 DIAGNOSIS — I10 Essential (primary) hypertension: Secondary | ICD-10-CM | POA: Diagnosis not present

## 2021-09-07 DIAGNOSIS — E039 Hypothyroidism, unspecified: Secondary | ICD-10-CM | POA: Diagnosis not present

## 2021-09-07 DIAGNOSIS — F331 Major depressive disorder, recurrent, moderate: Secondary | ICD-10-CM | POA: Diagnosis not present

## 2021-09-07 DIAGNOSIS — E785 Hyperlipidemia, unspecified: Secondary | ICD-10-CM | POA: Diagnosis not present

## 2021-09-07 DIAGNOSIS — Z79891 Long term (current) use of opiate analgesic: Secondary | ICD-10-CM | POA: Diagnosis not present

## 2021-09-07 DIAGNOSIS — N390 Urinary tract infection, site not specified: Secondary | ICD-10-CM | POA: Diagnosis not present

## 2021-09-07 DIAGNOSIS — Z950 Presence of cardiac pacemaker: Secondary | ICD-10-CM | POA: Diagnosis not present

## 2021-09-14 ENCOUNTER — Other Ambulatory Visit: Payer: Self-pay | Admitting: Cardiology

## 2021-09-28 ENCOUNTER — Ambulatory Visit: Payer: Medicare Other | Attending: Cardiology | Admitting: Cardiology

## 2021-09-28 ENCOUNTER — Encounter: Payer: Self-pay | Admitting: Cardiology

## 2021-09-28 VITALS — BP 124/78 | HR 78 | Ht 63.0 in | Wt 311.6 lb

## 2021-09-28 DIAGNOSIS — R0609 Other forms of dyspnea: Secondary | ICD-10-CM | POA: Insufficient documentation

## 2021-09-28 DIAGNOSIS — I10 Essential (primary) hypertension: Secondary | ICD-10-CM | POA: Diagnosis not present

## 2021-09-28 DIAGNOSIS — I5032 Chronic diastolic (congestive) heart failure: Secondary | ICD-10-CM | POA: Insufficient documentation

## 2021-09-28 DIAGNOSIS — I441 Atrioventricular block, second degree: Secondary | ICD-10-CM | POA: Diagnosis not present

## 2021-09-28 DIAGNOSIS — E119 Type 2 diabetes mellitus without complications: Secondary | ICD-10-CM | POA: Insufficient documentation

## 2021-09-28 DIAGNOSIS — I350 Nonrheumatic aortic (valve) stenosis: Secondary | ICD-10-CM | POA: Insufficient documentation

## 2021-09-28 NOTE — Progress Notes (Signed)
Cardiology Office Note:    Date:  09/28/2021   ID:  Vickie Ferguson [address] DOB 1955/07/14, MRN [id number]  PCP:  Lowella Dandy, NP  Cardiologist:  Jenne Campus, MD    Referring MD: Lowella Dandy, NP   Chief Complaint  Patient presents with   Follow-up    Diabetes no well controlled, is now on Tresiba 30 units daily.     History of Present Illness:    Vickie Ferguson is a 66 y.o. female with past medical history significant for diastolic congestive heart failure, essential hypertension, morbid obesity, dyslipidemia, aortic stenosis which was moderate to severe in March of this year, diminished ejection fraction 40 to 45%, ventricular ectopy.  She is in my office today for follow-up.  She said she had a problem with her kidney and her kidney function deteriorated on top of that she is complained of being weak tired and exhausted.  Denies have any chest pain tightness squeezing pressure burning chest, shortness of breath is as usual, no dizziness no passing out  Past Medical History:  Diagnosis Date   Anemia    Anginal pain (HCC)    Aortic stenosis 03/21/2020   Arthritis    CKD (chronic kidney disease), stage III (HCC)    Complication of anesthesia    Congestive heart failure (CHF) ([place]) 06/18/2019   Diabetes mellitus without complication ([place])    Essential hypertension 07/23/2014   Family history of adverse reaction to anesthesia    " FAMILY MEMBERS HAD DIFFICULTY WAKING "   Hypertension    Hypothyroidism    Iron deficiency anemia 07/23/2014   Long term current use of insulin ([place]) 01/07/2017   Morbid (severe) obesity due to excess calories ([place]) 07/23/2014   OSA (obstructive sleep apnea) 04/26/2017   Other long term (current) drug therapy 01/07/2017   Pacemaker [place] Jude device 09/01/2018   Palpitations 11/19/2019   PONV (postoperative nausea and vomiting)    Precordial pain 07/23/2014   Presence of permanent cardiac pacemaker 08/15/2018   St Jude Medical Assurity MRI  dual-chamber pacemaker for symptomatic bradycardia   Pressure injury of skin 06/17/2018   Second degree AV block    Shortness of breath 04/26/2017   TIA (transient ischemic attack) 04/26/2017   Type 2 diabetes mellitus without complication ([place]) 6/96/7893   Unstable angina ([place]) 06/17/2018   Ventricular tachycardia (paroxysmal) ([place]) 07/23/2014    Past Surgical History:  Procedure Laterality Date   CARDIAC CATHETERIZATION     INSERT / REPLACE / REMOVE PACEMAKER  08/15/2018   St Jude Medical Assurity MRI dual-chamber pacemaker for symptomatic bradycardia   LEFT HEART CATH AND CORONARY ANGIOGRAPHY N/A 06/17/2018   Procedure: LEFT HEART CATH AND CORONARY ANGIOGRAPHY;  Surgeon: Martinique, Peter M, MD;  Location: [facility] [facility];  Service: Cardiovascular;  Laterality: N/A;   PACEMAKER IMPLANT N/A 08/15/2018   Procedure: PACEMAKER IMPLANT;  Surgeon: Constance Haw, MD;  Location: [facility] [facility];  Service: Cardiovascular;  Laterality: N/A;   SHOULDER SURGERY Right 2012   METAL PLATE    TOTAL KNEE ARTHROPLASTY Right 2001    Current Medications: Current Meds  Medication Sig   acetaminophen (TYLENOL) 500 MG tablet Take 500 mg by mouth every 6 (six) hours as needed for mild pain or headache.   atorvastatin (LIPITOR) 40 MG tablet Take 1 tablet (40 mg total) by mouth daily.   clopidogrel (PLAVIX) 75 MG tablet Take 1 tablet (75 mg total) by mouth daily.   dexlansoprazole (  DEXILANT) 60 MG capsule Take 60 mg by mouth daily.    fluticasone (FLONASE) 50 MCG/ACT nasal spray Place 1 spray into both nostrils daily as needed for allergies. For allergies   furosemide (LASIX) 40 MG tablet TAKE 1/2 TO 1 TABLET AS DIRECTED, TAKE $RemoveBefore'40MG'yZcHwuLFjYhRc$  IN THE MORNING AND $RemoveBef'20MG'fHxUuxUHPV$  IN THE EVENING, IF GAINS 2 POUNDS IN 3 DAYS THEN TAKE EXTRA $RemoveBe'20MG'gixYNLdWm$  IN EVENING. (Patient taking differently: Take 10-20 mg by mouth See admin instructions. 40 mg in the morning and 20 mg in the evenings)   Insulin Degludec (TRESIBA [place]) Inject 30 Units into  the skin daily.   levothyroxine (SYNTHROID, LEVOTHROID) 75 MCG tablet Take 75 mcg by mouth daily before breakfast.    metoprolol tartrate (LOPRESSOR) 50 MG tablet Take 1 tablet (50 mg total) by mouth 2 (two) times daily.   nitroGLYCERIN (NITROSTAT) 0.4 MG SL tablet Place 1 tablet (0.4 mg total) under the tongue every 5 (five) minutes as needed. (Patient taking differently: Place 0.4 mg under the tongue every 5 (five) minutes as needed for chest pain.)   Oxycodone HCl 10 MG TABS Take 1 tablet by mouth as needed (pain).   polyvinyl alcohol (LIQUIFILM TEARS) 1.4 % ophthalmic solution Place 1 drop into both eyes as needed for dry eyes.   [DISCONTINUED] metFORMIN (GLUCOPHAGE) 500 MG tablet Take 500 mg by mouth 2 (two) times daily with a meal.     Allergies:   Aleve [naproxen sodium], Procainamide, Rythmol [propafenone hcl], Codeine, Doxycycline, Isosorbide nitrate, and Sertraline   Social History   Socioeconomic History   Marital status: Legally Separated    Spouse name: Not on file   Number of children: Not on file   Years of education: Not on file   Highest education level: Not on file  Occupational History   Not on file  Tobacco Use   Smoking status: Never   Smokeless tobacco: Never  Vaping Use   Vaping Use: Never used  Substance and Sexual Activity   Alcohol use: No    Alcohol/week: 0.0 standard drinks of alcohol   Drug use: No   Sexual activity: Not on file  Other Topics Concern   Not on file  Social History Narrative   Not on file   Social Determinants of Health   Financial Resource Strain: Not on file  Food Insecurity: Not on file  Transportation Needs: Not on file  Physical Activity: Not on file  Stress: Not on file  Social Connections: Not on file     Family History: [name] family history includes Heart disease in her mother. ROS:   Please see the history of present illness.    All 14 point review of systems negative except as described per history of  present illness  EKGs/Labs/Other Studies Reviewed:      Recent Labs: 01/12/2021: ALT 29; Hemoglobin 13.5; Platelets 271 05/05/2021: BUN 27; Creatinine, Ser 1.63; NT-Pro BNP 425; Potassium 5.1; Sodium 133  Recent Lipid Panel No results found for: "CHOL", "TRIG", "HDL", "CHOLHDL", "VLDL", "LDLCALC", "LDLDIRECT"  Physical Exam:    VS:  BP 124/78 (BP Location: Left Arm, Patient Position: Sitting)   Pulse 78   Ht $R'5\' 3"'Gf$  (1.6 m)   Wt (!) 311 lb 9.6 oz (141.3 kg)   SpO2 98%   BMI 55.20 kg/m     Wt Readings from Last 3 Encounters:  09/28/21 (!) 311 lb 9.6 oz (141.3 kg)  05/05/21 (!) 317 lb 9.6 oz (144.1 kg)  01/12/21 (!) 321 lb 8 oz (  145.8 kg)     GEN:  Well nourished, well developed in no acute distress HEENT: Normal NECK: No JVD; No carotid bruits LYMPHATICS: No lymphadenopathy CARDIAC: RRR, cystoscopy ejection murmur grade 3/6 best heard right upper portion of the sternum, no rubs, no gallops RESPIRATORY:  Clear to auscultation without rales, wheezing or rhonchi  ABDOMEN: Soft, non-tender, non-distended MUSCULOSKELETAL:  No edema; No deformity  SKIN: Warm and dry LOWER EXTREMITIES: no swelling NEUROLOGIC:  Alert and oriented x 3 PSYCHIATRIC:  Normal affect   ASSESSMENT:    1. Essential hypertension   2. Nonrheumatic aortic valve stenosis   3. Second degree AV block   4. Chronic diastolic congestive heart failure ([place])   5. Type 2 diabetes mellitus without complication, without long-term current use of insulin (HCC)    PLAN:    In order of problems listed above:  Aortic stenosis I am concerned about worsening of this condition.  She will have echocardiogram done to recheck on that.  Last time I seen her in May I wanted her to have echocardiogram done but for some reason it did not happen.  We must recheck her degree of stenosis obviously if she gets critical aortic stenosis it would be challenging trying to fix it but we will evaluate her for this at the time we find out  severe stenosis. It is still hypertension, blood pressure seems to well controlled continue present management. Dyslipidemia I did review her K PN data done by primary care physician from September 07, 2021 LDL 59 HDL 64.  She is on Lipitor 40 which I will continue. Type 2 diabetes that be followed by internal medicine team I did review K PN which show me a hemoglobin A1c 6.9.   Medication Adjustments/Labs and Tests Ordered: Current medicines are reviewed at length with the patient today.  Concerns regarding medicines are outlined above.  No orders of the defined types were placed in this encounter.  Medication changes: No orders of the defined types were placed in this encounter.   Signed, Park Liter, MD, [facility] 09/28/2021 1:03 PM    [place]

## 2021-09-28 NOTE — Patient Instructions (Addendum)
Medication Instructions:  Your physician recommends that you continue on your current medications as directed. Please refer to the Current Medication list given to you today.  *If you need a refill on your cardiac medications before your next appointment, please call your pharmacy*   Lab Work: None Ordered If you have labs (blood work) drawn today and your tests are completely normal, you will receive your results only by: [place] (if you have MyChart) OR A paper copy in the mail If you have any lab test that is abnormal or we need to change your treatment, we will call you to review the results.   Testing/Procedures: Your physician has requested that you have an echocardiogram. Echocardiography is a painless test that uses sound waves to create images of your heart. It provides your doctor with information about the size and shape of your heart and how well your heart's chambers and valves are working. This procedure takes approximately one hour. There are no restrictions for this procedure.    Follow-Up: At [facility], you and your health needs are our priority.  As part of our continuing mission to provide you with exceptional heart care, we have created designated Provider Care Teams.  These Care Teams include your primary Cardiologist (physician) and Advanced Practice Providers (APPs -  Physician Assistants and Nurse Practitioners) who all work together to provide you with the care you need, when you need it.  We recommend signing up for the patient portal called "MyChart".  Sign up information is provided on this After Visit Summary.  MyChart is used to connect with patients for Virtual Visits (Telemedicine).  Patients are able to view lab/test results, encounter notes, upcoming appointments, etc.  Non-urgent messages can be sent to your provider as well.   To learn more about what you can do with MyChart, go to [email or web address].    Your next appointment:   4  month(s)  The format for your next appointment:   In Person  Provider:   Jenne Campus, MD    Other Instructions NA

## 2021-10-12 DIAGNOSIS — D539 Nutritional anemia, unspecified: Secondary | ICD-10-CM | POA: Diagnosis not present

## 2021-10-12 DIAGNOSIS — K219 Gastro-esophageal reflux disease without esophagitis: Secondary | ICD-10-CM | POA: Diagnosis not present

## 2021-10-12 DIAGNOSIS — I10 Essential (primary) hypertension: Secondary | ICD-10-CM | POA: Diagnosis not present

## 2021-10-12 DIAGNOSIS — R600 Localized edema: Secondary | ICD-10-CM | POA: Diagnosis not present

## 2021-10-12 DIAGNOSIS — Z794 Long term (current) use of insulin: Secondary | ICD-10-CM | POA: Diagnosis not present

## 2021-10-12 DIAGNOSIS — E785 Hyperlipidemia, unspecified: Secondary | ICD-10-CM | POA: Diagnosis not present

## 2021-10-12 DIAGNOSIS — E1122 Type 2 diabetes mellitus with diabetic chronic kidney disease: Secondary | ICD-10-CM | POA: Diagnosis not present

## 2021-10-12 DIAGNOSIS — Z23 Encounter for immunization: Secondary | ICD-10-CM | POA: Diagnosis not present

## 2021-10-12 DIAGNOSIS — M1990 Unspecified osteoarthritis, unspecified site: Secondary | ICD-10-CM | POA: Diagnosis not present

## 2021-10-12 DIAGNOSIS — E039 Hypothyroidism, unspecified: Secondary | ICD-10-CM | POA: Diagnosis not present

## 2021-10-12 DIAGNOSIS — N1831 Chronic kidney disease, stage 3a: Secondary | ICD-10-CM | POA: Diagnosis not present

## 2021-10-12 DIAGNOSIS — Z950 Presence of cardiac pacemaker: Secondary | ICD-10-CM | POA: Diagnosis not present

## 2021-11-09 ENCOUNTER — Other Ambulatory Visit: Payer: Self-pay | Admitting: Cardiology

## 2021-11-09 ENCOUNTER — Ambulatory Visit (INDEPENDENT_AMBULATORY_CARE_PROVIDER_SITE_OTHER): Payer: Medicare Other

## 2021-11-09 DIAGNOSIS — I441 Atrioventricular block, second degree: Secondary | ICD-10-CM

## 2021-11-09 LAB — CUP PACEART REMOTE DEVICE CHECK
Battery Remaining Longevity: 68 mo
Battery Remaining Percentage: 71 %
Battery Voltage: 2.99 V
Brady Statistic AP VP Percent: 43 %
Brady Statistic AP VS Percent: 1 %
Brady Statistic AS VP Percent: 57 %
Brady Statistic AS VS Percent: 1 %
Brady Statistic RA Percent Paced: 43 %
Brady Statistic RV Percent Paced: 99 %
Date Time Interrogation Session: 20231109021706
Implantable Lead Connection Status: 753985
Implantable Lead Connection Status: 753985
Implantable Lead Implant Date: 20200814
Implantable Lead Implant Date: 20200814
Implantable Lead Location: 753859
Implantable Lead Location: 753860
Implantable Pulse Generator Implant Date: 20200814
Lead Channel Impedance Value: 410 Ohm
Lead Channel Impedance Value: 410 Ohm
Lead Channel Pacing Threshold Amplitude: 0.5 V
Lead Channel Pacing Threshold Amplitude: 1 V
Lead Channel Pacing Threshold Pulse Width: 0.5 ms
Lead Channel Pacing Threshold Pulse Width: 0.5 ms
Lead Channel Sensing Intrinsic Amplitude: 1.8 mV
Lead Channel Sensing Intrinsic Amplitude: 6 mV
Lead Channel Setting Pacing Amplitude: 2.5 V
Lead Channel Setting Pacing Amplitude: 2.5 V
Lead Channel Setting Pacing Pulse Width: 0.5 ms
Lead Channel Setting Sensing Sensitivity: 2 mV
Pulse Gen Model: 2272
Pulse Gen Serial Number: 9154171

## 2021-11-15 ENCOUNTER — Ambulatory Visit: Payer: Medicare Other | Attending: Cardiology

## 2021-11-15 DIAGNOSIS — R0609 Other forms of dyspnea: Secondary | ICD-10-CM | POA: Insufficient documentation

## 2021-11-15 LAB — ECHOCARDIOGRAM COMPLETE
AR max vel: 1.19 cm2
AV Area VTI: 1.1 cm2
AV Area mean vel: 1.19 cm2
AV Mean grad: 11.5 mmHg
AV Peak grad: 19.4 mmHg
Ao pk vel: 2.21 m/s
Area-P 1/2: 3.03 cm2
Calc EF: 43.7 %
S' Lateral: 3.4 cm
Single Plane A2C EF: 43.8 %
Single Plane A4C EF: 42 %

## 2021-11-15 MED ORDER — PERFLUTREN LIPID MICROSPHERE
1.0000 mL | INTRAVENOUS | Status: AC | PRN
  Administered 2021-11-15: 8 mL via INTRAVENOUS

## 2021-11-16 NOTE — Progress Notes (Signed)
Remote pacemaker transmission.   

## 2021-11-17 ENCOUNTER — Telehealth: Payer: Self-pay

## 2021-11-17 NOTE — Telephone Encounter (Signed)
Results reviewed with pt as per Dr. Krasowski's note.  Pt verbalized understanding and had no additional questions. Routed to PCP  

## 2021-12-14 DIAGNOSIS — E785 Hyperlipidemia, unspecified: Secondary | ICD-10-CM | POA: Diagnosis not present

## 2021-12-14 DIAGNOSIS — N1831 Chronic kidney disease, stage 3a: Secondary | ICD-10-CM | POA: Diagnosis not present

## 2021-12-14 DIAGNOSIS — E039 Hypothyroidism, unspecified: Secondary | ICD-10-CM | POA: Diagnosis not present

## 2021-12-14 DIAGNOSIS — Z794 Long term (current) use of insulin: Secondary | ICD-10-CM | POA: Diagnosis not present

## 2021-12-14 DIAGNOSIS — D539 Nutritional anemia, unspecified: Secondary | ICD-10-CM | POA: Diagnosis not present

## 2021-12-14 DIAGNOSIS — R5383 Other fatigue: Secondary | ICD-10-CM | POA: Diagnosis not present

## 2021-12-14 DIAGNOSIS — I10 Essential (primary) hypertension: Secondary | ICD-10-CM | POA: Diagnosis not present

## 2021-12-14 DIAGNOSIS — K219 Gastro-esophageal reflux disease without esophagitis: Secondary | ICD-10-CM | POA: Diagnosis not present

## 2021-12-14 DIAGNOSIS — E1122 Type 2 diabetes mellitus with diabetic chronic kidney disease: Secondary | ICD-10-CM | POA: Diagnosis not present

## 2022-01-12 ENCOUNTER — Other Ambulatory Visit: Payer: Medicare Other

## 2022-01-12 ENCOUNTER — Ambulatory Visit: Payer: Medicare Other | Admitting: Oncology

## 2022-01-18 ENCOUNTER — Telehealth: Payer: Self-pay | Admitting: Cardiology

## 2022-01-18 MED ORDER — FUROSEMIDE 40 MG PO TABS: 20.0000 mg | ORAL_TABLET | Freq: Two times a day (BID) | ORAL | 1 refills | Status: DC

## 2022-01-18 NOTE — Telephone Encounter (Signed)
*  STAT* If patient is at the pharmacy, call can be transferred to refill team.   1. Which medications need to be refilled? (please list name of each medication and dose if known)   furosemide (LASIX) 40 MG tablet   2. Which pharmacy/location (including street and city if local pharmacy) is medication to be sent to?  [place], [place]   3. Do they need a 30 day or 90 day supply?    90 day  Patient stated she only has 2 days worth of medication left. Patient stated her medication dosage was increased but this prescription still shows the old dosage.

## 2022-01-18 NOTE — Telephone Encounter (Signed)
Rx refill sent to pharmacy. 

## 2022-01-22 ENCOUNTER — Other Ambulatory Visit: Payer: Self-pay | Admitting: Oncology

## 2022-01-22 DIAGNOSIS — D631 Anemia in chronic kidney disease: Secondary | ICD-10-CM

## 2022-01-22 NOTE — Progress Notes (Signed)
[facility]  [address] [place],  [place]  [address] [phone]  Clinic Day:  01/23/2022  Referring physician: Lowella Dandy, NP  HISTORY OF PRESENT ILLNESS:  The patient is a 67 y.o. female with anemia secondary to both iron deficiency and kidney disease.  In the past, IV Feraheme was very effective in replenishing her iron stores and normalizing her hemoglobin.  She comes in today to reassess her hemoglobin.  Since her last visit, the patient has been been doing fine.  She denies having any overt forms of blood loss which concern her for recurrent anemia.  However, she claims her heart failure and renal insufficiency have worsened over time.  PHYSICAL EXAM:  Blood pressure (!) 191/91, pulse 65, temperature 97.7 F (36.5 C), resp. rate 16, height $RemoveBe'5\' 3"'CWDEjuZuH$  (1.6 m), weight (!) 322 lb 4.8 oz (146.2 kg), SpO2 99 %. Wt Readings from Last 3 Encounters:  01/23/22 (!) 322 lb 4.8 oz (146.2 kg)  09/28/21 (!) 311 lb 9.6 oz (141.3 kg)  05/05/21 (!) 317 lb 9.6 oz (144.1 kg)   Body mass index is 57.09 kg/m. Performance status (ECOG): 2 Physical Exam Constitutional:      Appearance: She is obese. She is not ill-appearing.  HENT:     Mouth/Throat:     Mouth: Mucous membranes are moist.     Pharynx: Oropharynx is clear. No oropharyngeal exudate or posterior oropharyngeal erythema.  Cardiovascular:     Rate and Rhythm: Normal rate and regular rhythm.     Heart sounds: No murmur heard.    No friction rub. No gallop.  Pulmonary:     Effort: Pulmonary effort is normal. No respiratory distress.     Breath sounds: Normal breath sounds. No wheezing, rhonchi or rales.  Abdominal:     General: Bowel sounds are normal. There is no distension.     Palpations: Abdomen is soft. There is no mass.     Tenderness: There is no abdominal tenderness.  Musculoskeletal:        General: No swelling.     Right lower leg: No edema.     Left lower leg: No edema.   Lymphadenopathy:     Cervical: No cervical adenopathy.     Upper Body:     Right upper body: No supraclavicular or axillary adenopathy.     Left upper body: No supraclavicular or axillary adenopathy.     Lower Body: No right inguinal adenopathy. No left inguinal adenopathy.  Skin:    General: Skin is warm.     Coloration: Skin is not jaundiced.     Findings: No lesion or rash.  Neurological:     General: No focal deficit present.     Mental Status: She is alert and oriented to person, place, and time. Mental status is at baseline.  Psychiatric:        Mood and Affect: Mood normal.        Behavior: Behavior normal.        Thought Content: Thought content normal.    LABS:    Iron studies pending     Latest Ref Rng & Units 01/23/2022   12:00 AM 01/12/2021   12:00 AM 12/05/2020   11:46 AM  CBC  WBC  8.3     6.7     7.0   Hemoglobin 12.0 - 16.0 12.8     13.5     13.7   Hematocrit 36 - 46 38  41     40.2   Platelets 150 - 400 K/uL 303     271     278      This result is from an external source.      Latest Ref Rng & Units 01/23/2022    9:47 AM 05/05/2021   10:17 AM 01/12/2021   12:00 AM  CMP  Glucose 70 - 99 mg/dL 108  193    BUN 8 - 23 mg/dL 30  27  34      Creatinine 0.44 - 1.00 mg/dL 1.47  1.63  1.6      Sodium 135 - 145 mmol/L 137  133  134      Potassium 3.5 - 5.1 mmol/L 4.7  5.1  4.8      Chloride 98 - 111 mmol/L 99  94  99      CO2 22 - 32 mmol/L $RemoveB'26  23  26      'ZySYoYjn$ Calcium 8.9 - 10.3 mg/dL 9.1  9.7    Total Protein 6.5 - 8.1 g/dL 7.8     Total Bilirubin 0.3 - 1.2 mg/dL 0.4     Alkaline Phos 38 - 126 U/L 98   102      AST 15 - 41 U/L 24   37      ALT 0 - 44 U/L 14   29         This result is from an external source.    Latest Reference Range & Units 01/23/22 09:46  Iron 28 - 170 ug/dL 53  UIBC ug/dL 371  TIBC 250 - 450 ug/dL 424  Saturation Ratios 10.4 - 31.8 % 13  Ferritin 11 - 307 ng/mL 23    ASSESSMENT & PLAN:  Assessment/Plan:  A 67 y.o. female with  anemia secondary to previous iron deficiency and renal insufficiency.  When evaluating her labs today, I remain pleased with her hemoglobin of 12.8.  Her iron parameters remain fine. Her renal insufficiency remains stable. Clinically, the patient appears to be doing okay.  As that is the case, I will see her back in another year for repeat clinical assessment.  The patient understands all the plans discussed today and is in agreement with them.    Victorya Hillman Macarthur Critchley, MD

## 2022-01-23 ENCOUNTER — Telehealth: Payer: Self-pay

## 2022-01-23 ENCOUNTER — Telehealth: Payer: Self-pay | Admitting: Oncology

## 2022-01-23 ENCOUNTER — Inpatient Hospital Stay: Payer: Medicare Other | Attending: Oncology

## 2022-01-23 ENCOUNTER — Inpatient Hospital Stay (INDEPENDENT_AMBULATORY_CARE_PROVIDER_SITE_OTHER): Payer: Medicare Other | Admitting: Oncology

## 2022-01-23 ENCOUNTER — Other Ambulatory Visit: Payer: Self-pay | Admitting: Oncology

## 2022-01-23 VITALS — BP 191/91 | HR 65 | Temp 97.7°F | Resp 16 | Ht 63.0 in | Wt 322.3 lb

## 2022-01-23 DIAGNOSIS — D649 Anemia, unspecified: Secondary | ICD-10-CM | POA: Diagnosis not present

## 2022-01-23 DIAGNOSIS — D631 Anemia in chronic kidney disease: Secondary | ICD-10-CM | POA: Insufficient documentation

## 2022-01-23 DIAGNOSIS — D508 Other iron deficiency anemias: Secondary | ICD-10-CM

## 2022-01-23 DIAGNOSIS — N189 Chronic kidney disease, unspecified: Secondary | ICD-10-CM | POA: Insufficient documentation

## 2022-01-23 DIAGNOSIS — I5032 Chronic diastolic (congestive) heart failure: Secondary | ICD-10-CM | POA: Diagnosis not present

## 2022-01-23 LAB — CBC AND DIFFERENTIAL
HCT: 38 (ref 36–46)
Hemoglobin: 12.8 (ref 12.0–16.0)
Neutrophils Absolute: 5.15
Platelets: 303 10*3/uL (ref 150–400)
WBC: 8.3

## 2022-01-23 LAB — CMP (CANCER CENTER ONLY)
ALT: 14 U/L (ref 0–44)
AST: 24 U/L (ref 15–41)
Albumin: 3.6 g/dL (ref 3.5–5.0)
Alkaline Phosphatase: 98 U/L (ref 38–126)
Anion gap: 12 (ref 5–15)
BUN: 30 mg/dL — ABNORMAL HIGH (ref 8–23)
CO2: 26 mmol/L (ref 22–32)
Calcium: 9.1 mg/dL (ref 8.9–10.3)
Chloride: 99 mmol/L (ref 98–111)
Creatinine: 1.47 mg/dL — ABNORMAL HIGH (ref 0.44–1.00)
GFR, Estimated: 39 mL/min — ABNORMAL LOW (ref >60)
Glucose, Bld: 108 mg/dL — ABNORMAL HIGH (ref 70–99)
Potassium: 4.7 mmol/L (ref 3.5–5.1)
Sodium: 137 mmol/L (ref 135–145)
Total Bilirubin: 0.4 mg/dL (ref 0.3–1.2)
Total Protein: 7.8 g/dL (ref 6.5–8.1)

## 2022-01-23 LAB — IRON AND TIBC
Iron: 53 ug/dL (ref 28–170)
Saturation Ratios: 13 % (ref 10.4–31.8)
TIBC: 424 ug/dL (ref 250–450)
UIBC: 371 ug/dL

## 2022-01-23 LAB — FERRITIN: Ferritin: 23 ng/mL (ref 11–307)

## 2022-01-23 LAB — CBC: RBC: 4.58 (ref 3.87–5.11)

## 2022-01-23 NOTE — Telephone Encounter (Signed)
  Latest Reference Range & Units 01/23/22 09:46   Iron 28 - 170 ug/dL 53  UIBC ug/dL 371  TIBC 250 - 450 ug/dL 424  Saturation Ratios 10.4 - 31.8 % 13  Ferritin 11 - 307 ng/mL 23      ASSESSMENT & PLAN:  Assessment/Plan:  A 67 y.o. female with anemia secondary to previous iron deficiency and renal insufficiency.  When evaluating her labs today, I remain pleased with her hemoglobin of 12.8.  Her iron parameters remain fine. Her renal insufficiency remains stable. Clinically, the patient appears to be doing okay.  As that is the case, I will see her back in another year for repeat clinical assessment.  The patient understands all the plans discussed today and is in agreement with them.     Dequincy Macarthur Critchley, MD

## 2022-01-23 NOTE — Telephone Encounter (Signed)
Patient has been scheduled for follow-up visit per 01/23/22 LOS.  Pt given an appt calendar with date and time.

## 2022-01-30 ENCOUNTER — Ambulatory Visit: Payer: Medicare Other | Attending: Cardiology | Admitting: Cardiology

## 2022-01-30 ENCOUNTER — Encounter: Payer: Self-pay | Admitting: Cardiology

## 2022-01-30 VITALS — BP 134/92 | HR 61 | Ht 62.0 in | Wt 326.6 lb

## 2022-01-30 DIAGNOSIS — Z950 Presence of cardiac pacemaker: Secondary | ICD-10-CM | POA: Insufficient documentation

## 2022-01-30 DIAGNOSIS — I350 Nonrheumatic aortic (valve) stenosis: Secondary | ICD-10-CM | POA: Diagnosis not present

## 2022-01-30 DIAGNOSIS — R002 Palpitations: Secondary | ICD-10-CM | POA: Diagnosis not present

## 2022-01-30 DIAGNOSIS — I10 Essential (primary) hypertension: Secondary | ICD-10-CM | POA: Insufficient documentation

## 2022-01-30 MED ORDER — HYDRALAZINE HCL 10 MG PO TABS: 10.0000 mg | ORAL_TABLET | Freq: Three times a day (TID) | ORAL | 3 refills | Status: DC

## 2022-01-30 NOTE — Addendum Note (Signed)
Addended by: Jacobo Forest D on: 01/30/2022 11:04 AM   Modules accepted: Orders

## 2022-01-30 NOTE — Patient Instructions (Signed)
Medication Instructions:   START: Hydralazine 10mg  1 tablet 3 times daily    Lab Work: None Ordered If you have labs (blood work) drawn today and your tests are completely normal, you will receive your results only by: [place] (if you have MyChart) OR A paper copy in the mail If you have any lab test that is abnormal or we need to change your treatment, we will call you to review the results.   Testing/Procedures: None Ordered   Follow-Up: At [facility], you and your health needs are our priority.  As part of our continuing mission to provide you with exceptional heart care, we have created designated Provider Care Teams.  These Care Teams include your primary Cardiologist (physician) and Advanced Practice Providers (APPs -  Physician Assistants and Nurse Practitioners) who all work together to provide you with the care you need, when you need it.  We recommend signing up for the patient portal called "MyChart".  Sign up information is provided on this After Visit Summary.  MyChart is used to connect with patients for Virtual Visits (Telemedicine).  Patients are able to view lab/test results, encounter notes, upcoming appointments, etc.  Non-urgent messages can be sent to your provider as well.   To learn more about what you can do with MyChart, go to [email or web address].    Your next appointment:   5 month(s)  The format for your next appointment:   In Person  Provider:   Jenne Campus, MD    Other Instructions NA

## 2022-01-30 NOTE — Progress Notes (Signed)
Cardiology Office Note:    Date:  01/30/2022   ID:  Vickie Ferguson, DOB 1955/07/05, MRN [id number]  PCP:  Lowella Dandy, NP  Cardiologist:  Jenne Campus, MD    Referring MD: Lowella Dandy, NP   Chief Complaint  Patient presents with   Follow-up  Doing the same  History of Present Illness:    Vickie Ferguson is a 67 y.o. female with past medical history significant for diastolic congestive heart failure, systolic ejection fraction lower limits of normal, essential hypertension, morbid obesity, dyslipidemia, moderate aortic stenosis, ventricular ectopy. She is in my office today for follow-up.  Overall she seems to be doing well.  She described to have rare episode of chest pain that happen typically at rest.  She takes Mylanta with relief but not always.  She did not try nitroglycerin for it.  She described to have some palpitation but those are rare and she is today as always.  Past Medical History:  Diagnosis Date   Anemia    Anginal pain ([place])    Aortic stenosis 03/21/2020   Arthritis    CKD (chronic kidney disease), stage III (HCC)    Complication of anesthesia    Congestive heart failure (CHF) ([place]) 06/18/2019   Diabetes mellitus without complication ([place])    Essential hypertension 07/23/2014   Family history of adverse reaction to anesthesia    " FAMILY MEMBERS HAD DIFFICULTY WAKING "   Hypertension    Hypothyroidism    Iron deficiency anemia 07/23/2014   Long term current use of insulin ([place]) 01/07/2017   Morbid (severe) obesity due to excess calories ([place]) 07/23/2014   OSA (obstructive sleep apnea) 04/26/2017   Other long term (current) drug therapy 01/07/2017   Pacemaker Saint Jude device 09/01/2018   Palpitations 11/19/2019   PONV (postoperative nausea and vomiting)    Precordial pain 07/23/2014   Presence of permanent cardiac pacemaker 08/15/2018   St Jude Medical Assurity MRI dual-chamber pacemaker for symptomatic bradycardia   Pressure injury of skin  06/17/2018   Second degree AV block    Shortness of breath 04/26/2017   TIA (transient ischemic attack) 04/26/2017   Type 2 diabetes mellitus without complication ([place]) 01/09/3233   Unstable angina ([place]) 06/17/2018   Ventricular tachycardia (paroxysmal) ([place]) 07/23/2014    Past Surgical History:  Procedure Laterality Date   CARDIAC CATHETERIZATION     INSERT / REPLACE / REMOVE PACEMAKER  08/15/2018   St Jude Medical Assurity MRI dual-chamber pacemaker for symptomatic bradycardia   LEFT HEART CATH AND CORONARY ANGIOGRAPHY N/A 06/17/2018   Procedure: LEFT HEART CATH AND CORONARY ANGIOGRAPHY;  Surgeon: Martinique, Peter M, MD;  Location: [facility];  Service: Cardiovascular;  Laterality: N/A;   PACEMAKER IMPLANT N/A 08/15/2018   Procedure: PACEMAKER IMPLANT;  Surgeon: Constance Haw, MD;  Location: [facility];  Service: Cardiovascular;  Laterality: N/A;   SHOULDER SURGERY Right 2012   METAL PLATE    TOTAL KNEE ARTHROPLASTY Right 2001    Current Medications: Current Meds  Medication Sig   acetaminophen (TYLENOL) 500 MG tablet Take 500 mg by mouth every 6 (six) hours as needed for mild pain or headache.   atorvastatin (LIPITOR) 40 MG tablet TAKE 1 TABLET BY MOUTH AT BEDTIME.   clopidogrel (PLAVIX) 75 MG tablet Take 1 tablet (75 mg total) by mouth daily.   dexlansoprazole (DEXILANT) 60 MG capsule Take 60 mg by mouth daily.    fluticasone (FLONASE) 50 MCG/ACT  nasal spray Place 1 spray into both nostrils daily as needed for allergies. For allergies   furosemide (LASIX) 40 MG tablet Take 0.5-1 tablets (20-40 mg total) by mouth 2 (two) times daily. TAKE $RemoveBef'40MG'ZcBuVULhSW$  IN THE MORNING AND $RemoveBef'20MG'WBPqXhtQAa$  IN THE EVENING, IF GAINS 2 POUNDS IN 3 DAYS THEN TAKE EXTRA $RemoveBe'20MG'KvTXWuqeO$  IN EVENING. (Patient taking differently: Take 40 mg by mouth 2 (two) times daily.)   Insulin Degludec (TRESIBA [place]) Inject 35 Units into the skin daily.   levothyroxine (SYNTHROID, LEVOTHROID) 75 MCG tablet Take 75 mcg by mouth daily before  breakfast.    metoprolol tartrate (LOPRESSOR) 50 MG tablet Take 1 tablet (50 mg total) by mouth 2 (two) times daily.   nitroGLYCERIN (NITROSTAT) 0.4 MG SL tablet Place 1 tablet (0.4 mg total) under the tongue every 5 (five) minutes as needed. (Patient taking differently: Place 0.4 mg under the tongue every 5 (five) minutes as needed for chest pain.)   Oxycodone HCl 10 MG TABS Take 1 tablet by mouth as needed (pain).   polyvinyl alcohol (LIQUIFILM TEARS) 1.4 % ophthalmic solution Place 1 drop into both eyes as needed for dry eyes.     Allergies:   Aleve [naproxen sodium], Procainamide, Rythmol [propafenone hcl], Codeine, Doxycycline, Isosorbide nitrate, and Sertraline   Social History   Socioeconomic History   Marital status: Legally Separated    Spouse name: Not on file   Number of children: Not on file   Years of education: Not on file   Highest education level: Not on file  Occupational History   Not on file  Tobacco Use   Smoking status: Never   Smokeless tobacco: Never  Vaping Use   Vaping Use: Never used  Substance and Sexual Activity   Alcohol use: No    Alcohol/week: 0.0 standard drinks of alcohol   Drug use: No   Sexual activity: Not on file  Other Topics Concern   Not on file  Social History Narrative   Not on file   Social Determinants of Health   Financial Resource Strain: Not on file  Food Insecurity: Not on file  Transportation Needs: Not on file  Physical Activity: Not on file  Stress: Not on file  Social Connections: Not on file     Family History: [name] family history includes Heart disease in her mother. ROS:   Please see the history of present illness.    All 14 point review of systems negative except as described per history of present illness  EKGs/Labs/Other Studies Reviewed:      Recent Labs: 05/05/2021: NT-Pro BNP 425 01/23/2022: ALT 14; BUN 30; Creatinine 1.47; Hemoglobin 12.8; Platelets 303; Potassium 4.7; Sodium 137  Recent Lipid  Panel No results found for: "CHOL", "TRIG", "HDL", "CHOLHDL", "VLDL", "LDLCALC", "LDLDIRECT"  Physical Exam:    VS:  BP (!) 134/92 (BP Location: Left Arm, Patient Position: Sitting)   Pulse 61   Ht $R'5\' 2"'Db$  (1.575 m)   Wt (!) 326 lb 9.6 oz (148.1 kg)   SpO2 99%   BMI 59.74 kg/m     Wt Readings from Last 3 Encounters:  01/30/22 (!) 326 lb 9.6 oz (148.1 kg)  01/23/22 (!) 322 lb 4.8 oz (146.2 kg)  09/28/21 (!) 311 lb 9.6 oz (141.3 kg)     GEN:  Well nourished, well developed in no acute distress HEENT: Normal NECK: No JVD; No carotid bruits LYMPHATICS: No lymphadenopathy CARDIAC: RRR, systolic ejection murmur grade 1/6 to 2/6 best heard right upper portion of  sternum, no rubs, no gallops RESPIRATORY:  Clear to auscultation without rales, wheezing or rhonchi  ABDOMEN: Soft, non-tender, non-distended MUSCULOSKELETAL:  No edema; No deformity  SKIN: Warm and dry LOWER EXTREMITIES: no swelling NEUROLOGIC:  Alert and oriented x 3 PSYCHIATRIC:  Normal affect   ASSESSMENT:    1. Nonrheumatic aortic valve stenosis   2. Essential hypertension   3. Pacemaker [place] Jude device   4. Palpitations    PLAN:    In order of problems listed above:  Nonrheumatic aortic valve stenosis, moderate based on last assessment.  She is asymptomatic continue present management. Essential hypertension, blood pressure still elevated.  I will initiate hydralazine 10 mg 3 times daily. Pacemaker present normal function continue monitoring.  Last interrogation done in November 2023 that I reviewed, asked active longevity of the device is 5.7 years   Medication Adjustments/Labs and Tests Ordered: Current medicines are reviewed at length with the patient today.  Concerns regarding medicines are outlined above.  No orders of the defined types were placed in this encounter.  Medication changes: No orders of the defined types were placed in this encounter.   Signed, Park Liter, MD, [facility] 01/30/2022  10:51 AM    [place]

## 2022-02-08 ENCOUNTER — Ambulatory Visit (INDEPENDENT_AMBULATORY_CARE_PROVIDER_SITE_OTHER): Payer: Medicare Other

## 2022-02-08 DIAGNOSIS — I441 Atrioventricular block, second degree: Secondary | ICD-10-CM | POA: Diagnosis not present

## 2022-02-08 LAB — CUP PACEART REMOTE DEVICE CHECK
Battery Remaining Longevity: 65 mo
Battery Remaining Percentage: 69 %
Battery Voltage: 2.99 V
Brady Statistic AP VP Percent: 46 %
Brady Statistic AP VS Percent: 1 %
Brady Statistic AS VP Percent: 54 %
Brady Statistic AS VS Percent: 1 %
Brady Statistic RA Percent Paced: 45 %
Brady Statistic RV Percent Paced: 99 %
Date Time Interrogation Session: 20240208030236
Implantable Lead Connection Status: 753985
Implantable Lead Connection Status: 753985
Implantable Lead Implant Date: 20200814
Implantable Lead Implant Date: 20200814
Implantable Lead Location: 753859
Implantable Lead Location: 753860
Implantable Pulse Generator Implant Date: 20200814
Lead Channel Impedance Value: 400 Ohm
Lead Channel Impedance Value: 400 Ohm
Lead Channel Pacing Threshold Amplitude: 0.5 V
Lead Channel Pacing Threshold Amplitude: 1 V
Lead Channel Pacing Threshold Pulse Width: 0.5 ms
Lead Channel Pacing Threshold Pulse Width: 0.5 ms
Lead Channel Sensing Intrinsic Amplitude: 1.6 mV
Lead Channel Sensing Intrinsic Amplitude: 2.5 mV
Lead Channel Setting Pacing Amplitude: 2.5 V
Lead Channel Setting Pacing Amplitude: 2.5 V
Lead Channel Setting Pacing Pulse Width: 0.5 ms
Lead Channel Setting Sensing Sensitivity: 2 mV
Pulse Gen Model: 2272
Pulse Gen Serial Number: 9154171

## 2022-02-25 ENCOUNTER — Other Ambulatory Visit: Payer: Self-pay | Admitting: Cardiology

## 2022-02-28 NOTE — Progress Notes (Signed)
Remote pacemaker transmission.   

## 2022-03-15 DIAGNOSIS — E559 Vitamin D deficiency, unspecified: Secondary | ICD-10-CM | POA: Diagnosis not present

## 2022-03-15 DIAGNOSIS — R300 Dysuria: Secondary | ICD-10-CM | POA: Diagnosis not present

## 2022-03-15 DIAGNOSIS — N390 Urinary tract infection, site not specified: Secondary | ICD-10-CM | POA: Diagnosis not present

## 2022-03-15 DIAGNOSIS — D539 Nutritional anemia, unspecified: Secondary | ICD-10-CM | POA: Diagnosis not present

## 2022-03-15 DIAGNOSIS — E785 Hyperlipidemia, unspecified: Secondary | ICD-10-CM | POA: Diagnosis not present

## 2022-03-15 DIAGNOSIS — Z79891 Long term (current) use of opiate analgesic: Secondary | ICD-10-CM | POA: Diagnosis not present

## 2022-03-15 DIAGNOSIS — I10 Essential (primary) hypertension: Secondary | ICD-10-CM | POA: Diagnosis not present

## 2022-03-15 DIAGNOSIS — E1122 Type 2 diabetes mellitus with diabetic chronic kidney disease: Secondary | ICD-10-CM | POA: Diagnosis not present

## 2022-03-15 DIAGNOSIS — Z794 Long term (current) use of insulin: Secondary | ICD-10-CM | POA: Diagnosis not present

## 2022-03-15 DIAGNOSIS — N1832 Chronic kidney disease, stage 3b: Secondary | ICD-10-CM | POA: Diagnosis not present

## 2022-03-15 DIAGNOSIS — E039 Hypothyroidism, unspecified: Secondary | ICD-10-CM | POA: Diagnosis not present

## 2022-03-16 ENCOUNTER — Other Ambulatory Visit: Payer: Self-pay | Admitting: Cardiology

## 2022-03-27 ENCOUNTER — Other Ambulatory Visit: Payer: Self-pay | Admitting: Cardiology

## 2022-05-08 ENCOUNTER — Other Ambulatory Visit: Payer: Self-pay | Admitting: Cardiology

## 2022-05-10 ENCOUNTER — Ambulatory Visit (INDEPENDENT_AMBULATORY_CARE_PROVIDER_SITE_OTHER): Payer: Medicare Other

## 2022-05-10 DIAGNOSIS — I441 Atrioventricular block, second degree: Secondary | ICD-10-CM | POA: Diagnosis not present

## 2022-05-10 LAB — CUP PACEART REMOTE DEVICE CHECK
Battery Remaining Longevity: 64 mo
Battery Remaining Percentage: 66 %
Battery Voltage: 2.99 V
Brady Statistic AP VP Percent: 47 %
Brady Statistic AP VS Percent: 1 %
Brady Statistic AS VP Percent: 53 %
Brady Statistic AS VS Percent: 1 %
Brady Statistic RA Percent Paced: 47 %
Brady Statistic RV Percent Paced: 99 %
Date Time Interrogation Session: 20240509023917
Implantable Lead Connection Status: 753985
Implantable Lead Connection Status: 753985
Implantable Lead Implant Date: 20200814
Implantable Lead Implant Date: 20200814
Implantable Lead Location: 753859
Implantable Lead Location: 753860
Implantable Pulse Generator Implant Date: 20200814
Lead Channel Impedance Value: 430 Ohm
Lead Channel Impedance Value: 430 Ohm
Lead Channel Pacing Threshold Amplitude: 0.5 V
Lead Channel Pacing Threshold Amplitude: 1 V
Lead Channel Pacing Threshold Pulse Width: 0.5 ms
Lead Channel Pacing Threshold Pulse Width: 0.5 ms
Lead Channel Sensing Intrinsic Amplitude: 2 mV
Lead Channel Sensing Intrinsic Amplitude: 2.5 mV
Lead Channel Setting Pacing Amplitude: 2.5 V
Lead Channel Setting Pacing Amplitude: 2.5 V
Lead Channel Setting Pacing Pulse Width: 0.5 ms
Lead Channel Setting Sensing Sensitivity: 2 mV
Pulse Gen Model: 2272
Pulse Gen Serial Number: 9154171

## 2022-05-29 NOTE — Progress Notes (Signed)
Remote pacemaker transmission.   

## 2022-06-15 DIAGNOSIS — N1832 Chronic kidney disease, stage 3b: Secondary | ICD-10-CM | POA: Diagnosis not present

## 2022-06-15 DIAGNOSIS — Z9181 History of falling: Secondary | ICD-10-CM | POA: Diagnosis not present

## 2022-06-15 DIAGNOSIS — Z794 Long term (current) use of insulin: Secondary | ICD-10-CM | POA: Diagnosis not present

## 2022-06-15 DIAGNOSIS — M1990 Unspecified osteoarthritis, unspecified site: Secondary | ICD-10-CM | POA: Diagnosis not present

## 2022-06-15 DIAGNOSIS — F331 Major depressive disorder, recurrent, moderate: Secondary | ICD-10-CM | POA: Diagnosis not present

## 2022-06-15 DIAGNOSIS — E785 Hyperlipidemia, unspecified: Secondary | ICD-10-CM | POA: Diagnosis not present

## 2022-06-15 DIAGNOSIS — I10 Essential (primary) hypertension: Secondary | ICD-10-CM | POA: Diagnosis not present

## 2022-06-15 DIAGNOSIS — I5032 Chronic diastolic (congestive) heart failure: Secondary | ICD-10-CM | POA: Diagnosis not present

## 2022-06-15 DIAGNOSIS — E1122 Type 2 diabetes mellitus with diabetic chronic kidney disease: Secondary | ICD-10-CM | POA: Diagnosis not present

## 2022-06-15 DIAGNOSIS — R600 Localized edema: Secondary | ICD-10-CM | POA: Diagnosis not present

## 2022-06-15 DIAGNOSIS — E039 Hypothyroidism, unspecified: Secondary | ICD-10-CM | POA: Diagnosis not present

## 2022-06-15 DIAGNOSIS — I428 Other cardiomyopathies: Secondary | ICD-10-CM | POA: Diagnosis not present

## 2022-06-15 DIAGNOSIS — D539 Nutritional anemia, unspecified: Secondary | ICD-10-CM | POA: Diagnosis not present

## 2022-06-15 DIAGNOSIS — Z950 Presence of cardiac pacemaker: Secondary | ICD-10-CM | POA: Diagnosis not present

## 2022-06-25 DIAGNOSIS — R600 Localized edema: Secondary | ICD-10-CM | POA: Diagnosis not present

## 2022-06-25 DIAGNOSIS — I5032 Chronic diastolic (congestive) heart failure: Secondary | ICD-10-CM | POA: Diagnosis not present

## 2022-06-25 DIAGNOSIS — I10 Essential (primary) hypertension: Secondary | ICD-10-CM | POA: Diagnosis not present

## 2022-06-25 DIAGNOSIS — I428 Other cardiomyopathies: Secondary | ICD-10-CM | POA: Diagnosis not present

## 2022-06-25 DIAGNOSIS — R110 Nausea: Secondary | ICD-10-CM | POA: Diagnosis not present

## 2022-07-13 DIAGNOSIS — R110 Nausea: Secondary | ICD-10-CM | POA: Diagnosis not present

## 2022-07-13 DIAGNOSIS — I5032 Chronic diastolic (congestive) heart failure: Secondary | ICD-10-CM | POA: Diagnosis not present

## 2022-07-13 DIAGNOSIS — I10 Essential (primary) hypertension: Secondary | ICD-10-CM | POA: Diagnosis not present

## 2022-07-13 DIAGNOSIS — I428 Other cardiomyopathies: Secondary | ICD-10-CM | POA: Diagnosis not present

## 2022-07-13 DIAGNOSIS — R600 Localized edema: Secondary | ICD-10-CM | POA: Diagnosis not present

## 2022-07-13 DIAGNOSIS — E039 Hypothyroidism, unspecified: Secondary | ICD-10-CM | POA: Diagnosis not present

## 2022-08-09 ENCOUNTER — Ambulatory Visit (INDEPENDENT_AMBULATORY_CARE_PROVIDER_SITE_OTHER): Payer: Medicare Other

## 2022-08-09 DIAGNOSIS — I441 Atrioventricular block, second degree: Secondary | ICD-10-CM | POA: Diagnosis not present

## 2022-08-29 ENCOUNTER — Other Ambulatory Visit: Payer: Self-pay | Admitting: Cardiology

## 2022-09-07 ENCOUNTER — Ambulatory Visit: Payer: Medicare Other | Attending: Cardiology | Admitting: Cardiology

## 2022-09-07 ENCOUNTER — Encounter: Payer: Self-pay | Admitting: Cardiology

## 2022-09-07 VITALS — BP 140/90 | HR 66 | Ht 62.0 in | Wt 346.2 lb

## 2022-09-07 DIAGNOSIS — Z9889 Other specified postprocedural states: Secondary | ICD-10-CM

## 2022-09-07 DIAGNOSIS — I499 Cardiac arrhythmia, unspecified: Secondary | ICD-10-CM | POA: Insufficient documentation

## 2022-09-07 DIAGNOSIS — Z79899 Other long term (current) drug therapy: Secondary | ICD-10-CM

## 2022-09-07 DIAGNOSIS — R072 Precordial pain: Secondary | ICD-10-CM

## 2022-09-07 DIAGNOSIS — Z950 Presence of cardiac pacemaker: Secondary | ICD-10-CM

## 2022-09-07 DIAGNOSIS — E039 Hypothyroidism, unspecified: Secondary | ICD-10-CM

## 2022-09-07 DIAGNOSIS — I441 Atrioventricular block, second degree: Secondary | ICD-10-CM | POA: Diagnosis not present

## 2022-09-07 DIAGNOSIS — I10 Essential (primary) hypertension: Secondary | ICD-10-CM | POA: Diagnosis not present

## 2022-09-07 DIAGNOSIS — G4733 Obstructive sleep apnea (adult) (pediatric): Secondary | ICD-10-CM | POA: Diagnosis not present

## 2022-09-07 DIAGNOSIS — E119 Type 2 diabetes mellitus without complications: Secondary | ICD-10-CM

## 2022-09-07 DIAGNOSIS — N183 Chronic kidney disease, stage 3 unspecified: Secondary | ICD-10-CM | POA: Diagnosis not present

## 2022-09-07 DIAGNOSIS — N189 Chronic kidney disease, unspecified: Secondary | ICD-10-CM | POA: Diagnosis not present

## 2022-09-07 DIAGNOSIS — M1990 Unspecified osteoarthritis, unspecified site: Secondary | ICD-10-CM

## 2022-09-07 DIAGNOSIS — I209 Angina pectoris, unspecified: Secondary | ICD-10-CM | POA: Diagnosis not present

## 2022-09-07 DIAGNOSIS — I4729 Other ventricular tachycardia: Secondary | ICD-10-CM

## 2022-09-07 DIAGNOSIS — I350 Nonrheumatic aortic (valve) stenosis: Secondary | ICD-10-CM | POA: Insufficient documentation

## 2022-09-07 DIAGNOSIS — R0609 Other forms of dyspnea: Secondary | ICD-10-CM | POA: Insufficient documentation

## 2022-09-07 DIAGNOSIS — R002 Palpitations: Secondary | ICD-10-CM

## 2022-09-07 DIAGNOSIS — Z794 Long term (current) use of insulin: Secondary | ICD-10-CM

## 2022-09-07 DIAGNOSIS — D631 Anemia in chronic kidney disease: Secondary | ICD-10-CM | POA: Insufficient documentation

## 2022-09-07 DIAGNOSIS — I200 Unstable angina: Secondary | ICD-10-CM

## 2022-09-07 DIAGNOSIS — I5032 Chronic diastolic (congestive) heart failure: Secondary | ICD-10-CM | POA: Diagnosis not present

## 2022-09-07 DIAGNOSIS — D508 Other iron deficiency anemias: Secondary | ICD-10-CM

## 2022-09-07 DIAGNOSIS — L8990 Pressure ulcer of unspecified site, unspecified stage: Secondary | ICD-10-CM

## 2022-09-07 DIAGNOSIS — D649 Anemia, unspecified: Secondary | ICD-10-CM

## 2022-09-07 DIAGNOSIS — G459 Transient cerebral ischemic attack, unspecified: Secondary | ICD-10-CM

## 2022-09-07 DIAGNOSIS — Z8489 Family history of other specified conditions: Secondary | ICD-10-CM

## 2022-09-07 DIAGNOSIS — R0602 Shortness of breath: Secondary | ICD-10-CM

## 2022-09-07 DIAGNOSIS — T8859XA Other complications of anesthesia, initial encounter: Secondary | ICD-10-CM

## 2022-09-07 NOTE — Patient Instructions (Addendum)
Medication Instructions:  Your physician recommends that you continue on your current medications as directed. Please refer to the Current Medication list given to you today.  *If you need a refill on your cardiac medications before your next appointment, please call your pharmacy*   Lab Work: BMP, ProBNP- today If you have labs (blood work) drawn today and your tests are completely normal, you will receive your results only by: MyChart Message (if you have MyChart) OR A paper copy in the mail If you have any lab test that is abnormal or we need to change your treatment, we will call you to review the results.   Testing/Procedures: Your physician has requested that you have an echocardiogram. Echocardiography is a painless test that uses sound waves to create images of your heart. It provides your doctor with information about the size and shape of your heart and how well your heart's chambers and valves are working. This procedure takes approximately one hour. There are no restrictions for this procedure. Please do NOT wear cologne, perfume, aftershave, or lotions (deodorant is allowed). Please arrive 15 minutes prior to your appointment time.    Follow-Up: At [facility], you and your health needs are our priority.  As part of our continuing mission to provide you with exceptional heart care, we have created designated Provider Care Teams.  These Care Teams include your primary Cardiologist (physician) and Advanced Practice Providers (APPs -  Physician Assistants and Nurse Practitioners) who all work together to provide you with the care you need, when you need it.  We recommend signing up for the patient portal called "MyChart".  Sign up information is provided on this After Visit Summary.  MyChart is used to connect with patients for Virtual Visits (Telemedicine).  Patients are able to view lab/test results, encounter notes, upcoming appointments, etc.  Non-urgent messages can be sent to  your provider as well.   To learn more about what you can do with MyChart, go to [email or web address].    Your next appointment:   2 month(s)  The format for your next appointment:   In Person  Provider:   Gypsy Balsam, MD    Other Instructions NA

## 2022-09-07 NOTE — Progress Notes (Signed)
Cardiology Office Note:    Date:  09/07/2022   ID:  Vickie Ferguson, DOB 09/22/1955, MRN [id number]  PCP:  Hurshel Party, NP  Cardiologist:  Gypsy Balsam, MD    Referring MD: Hurshel Party, NP   Chief Complaint  Patient presents with   Follow-up    History of Present Illness:    Vickie Ferguson is a 67 y.o. female with past medical history significant for diastolic congestive heart failure, her systolic ejection fraction lower limits of normal, essential hypertension, morbid obesity, dyslipidemia, moderate aortic stenosis, ventricular ectopy.  Recently she was put on the insulin she says since that time she is not doing well she gained significant amount of weight she thinks is related to fluid retention.  She also have a little more palpitation she also describes duration when she leans forward she will have some shock from her pacemaker more about potential muscle stimulation.  She said it is difficult for her to move around.  She was referred to nephrologist but she said she is not able to get in [place].  Past Medical History:  Diagnosis Date   Anemia    Anginal pain (HCC)    Aortic stenosis 03/21/2020   Arthritis    CKD (chronic kidney disease), stage III (HCC)    Complication of anesthesia    Congestive heart failure (CHF) (HCC) 06/18/2019   Diabetes mellitus without complication (HCC)    Essential hypertension 07/23/2014   Family history of adverse reaction to anesthesia    " FAMILY MEMBERS HAD DIFFICULTY WAKING "   Hypertension    Hypothyroidism    Iron deficiency anemia 07/23/2014   Long term current use of insulin (HCC) 01/07/2017   Morbid (severe) obesity due to excess calories (HCC) 07/23/2014   OSA (obstructive sleep apnea) 04/26/2017   Other long term (current) drug therapy 01/07/2017   Pacemaker Saint Jude device 09/01/2018   Palpitations 11/19/2019   PONV (postoperative nausea and vomiting)    Precordial pain 07/23/2014   Presence of permanent cardiac  pacemaker 08/15/2018   St Jude Medical Assurity MRI dual-chamber pacemaker for symptomatic bradycardia   Pressure injury of skin 06/17/2018   Second degree AV block    Shortness of breath 04/26/2017   TIA (transient ischemic attack) 04/26/2017   Type 2 diabetes mellitus without complication (HCC) 07/23/2014   Unstable angina (HCC) 06/17/2018   Ventricular tachycardia (paroxysmal) (HCC) 07/23/2014    Past Surgical History:  Procedure Laterality Date   CARDIAC CATHETERIZATION     INSERT / REPLACE / REMOVE PACEMAKER  08/15/2018   St Jude Medical Assurity MRI dual-chamber pacemaker for symptomatic bradycardia   LEFT HEART CATH AND CORONARY ANGIOGRAPHY N/A 06/17/2018   Procedure: LEFT HEART CATH AND CORONARY ANGIOGRAPHY;  Surgeon: Swazil[place], Peter M, MD;  Location: [facility] INVASIVE CV LAB;  Service: Cardiovascular;  Laterality: N/A;   PACEMAKER IMPLANT N/A 08/15/2018   Procedure: PACEMAKER IMPLANT;  Surgeon: Regan Lemming, MD;  Location: [facility] INVASIVE CV LAB;  Service: Cardiovascular;  Laterality: N/A;   SHOULDER SURGERY Right 2012   METAL PLATE    TOTAL KNEE ARTHROPLASTY Right 2001    Current Medications: Current Meds  Medication Sig   acetaminophen (TYLENOL) 500 MG tablet Take 500 mg by mouth every 6 (six) hours as needed for mild pain or headache.   atorvastatin (LIPITOR) 40 MG tablet Take 1 tablet (40 mg total) by mouth daily.   clopidogrel (PLAVIX) 75 MG tablet Take 1 tablet by mouth  once daily.   dexlansoprazole (DEXILANT) 60 MG capsule Take 60 mg by mouth daily.    fluticasone (FLONASE) 50 MCG/ACT nasal spray Place 1 spray into both nostrils daily as needed for allergies. For allergies   Insulin Degludec (TRESIBA Hensley) Inject 55 Units into the skin daily.   levothyroxine (SYNTHROID, LEVOTHROID) 75 MCG tablet Take 75 mcg by mouth daily before breakfast.    metoprolol tartrate (LOPRESSOR) 50 MG tablet Take 1 tablet (50 mg total) by mouth 2 (two) times daily.   nitroGLYCERIN (NITROSTAT) 0.4 MG  SL tablet Place 1 tablet (0.4 mg total) under the tongue every 5 (five) minutes as needed. (Patient taking differently: Place 0.4 mg under the tongue every 5 (five) minutes as needed for chest pain.)   Oxycodone HCl 10 MG TABS Take 1 tablet by mouth as needed (pain).   polyvinyl alcohol (LIQUIFILM TEARS) 1.4 % ophthalmic solution Place 1 drop into both eyes as needed for dry eyes.   torsemide (DEMADEX) 20 MG tablet Take 20 mg by mouth daily.   [DISCONTINUED] furosemide (LASIX) 40 MG tablet Take 1/2 to 1 tab twice daily. Take 1 tab in the morning and 1/2 tab in the evening, if gains 2 lbs in 3 days then take an extra 1/2 tab in the evening. (Patient taking differently: Take 40 mg by mouth See admin instructions. 1 tab am and 1/2 tab evening if gains 2lbs in 3 days take extra 1/2 tab in the evening)   [DISCONTINUED] hydrALAZINE (APRESOLINE) 10 MG tablet Take 1 tablet (10 mg total) by mouth 3 (three) times daily.     Allergies:   Aleve [naproxen sodium], Procainamide, Rythmol [propafenone hcl], Codeine, Doxycycline, Isosorbide nitrate, and Sertraline   Social History   Socioeconomic History   Marital status: Legally Separated    Spouse name: Not on file   Number of children: Not on file   Years of education: Not on file   Highest education level: Not on file  Occupational History   Not on file  Tobacco Use   Smoking status: Never   Smokeless tobacco: Never  Vaping Use   Vaping status: Never Used  Substance and Sexual Activity   Alcohol use: No    Alcohol/week: 0.0 standard drinks of alcohol   Drug use: No   Sexual activity: Not on file  Other Topics Concern   Not on file  Social History Narrative   Not on file   Social Determinants of Health   Financial Resource Strain: Not on file  Food Insecurity: Not on file  Transportation Needs: Not on file  Physical Activity: Not on file  Stress: Not on file  Social Connections: Not on file     Family History: [name] family  history includes Heart disease in her mother. ROS:   Please see the history of present illness.    All 14 point review of systems negative except as described per history of present illness  EKGs/Labs/Other Studies Reviewed:    EKG Interpretation Date/Time:  Friday September 07 2022 11:35:54 EDT Ventricular Rate:  66 PR Interval:    QRS Duration:  186 QT Interval:  484 QTC Calculation: 507 R Axis:   35  Text Interpretation: Ventricular-paced rhythm Abnormal ECG When compared with ECG of 16-Aug-2018 04:09, Electronic ventricular pacemaker has replaced Sinus rhythm Confirmed by Gypsy Balsam [phone]) on 09/07/2022 11:44:05 AM    Recent Labs: 01/23/2022: ALT 14; BUN 30; Creatinine 1.47; Hemoglobin 12.8; Platelets 303; Potassium 4.7; Sodium 137  Recent Lipid  Panel No results found for: "CHOL", "TRIG", "HDL", "CHOLHDL", "VLDL", "LDLCALC", "LDLDIRECT"  Physical Exam:    VS:  BP (!) 140/90 (BP Location: Left Arm, Patient Position: Sitting)   Pulse 66   Ht 5\' 2"  (1.575 m)   Wt (!) 346 lb 3.2 oz (157 kg)   SpO2 95%   BMI 63.32 kg/m     Wt Readings from Last 3 Encounters:  09/07/22 (!) 346 lb 3.2 oz (157 kg)  01/30/22 (!) 326 lb 9.6 oz (148.1 kg)  01/23/22 (!) 322 lb 4.8 oz (146.2 kg)     GEN:  Well nourished, well developed in no acute distress HEENT: Normal NECK: No JVD; No carotid bruits LYMPHATICS: No lymphadenopathy CARDIAC: RRR, no murmurs, no rubs, no gallops RESPIRATORY:  Clear to auscultation without rales, wheezing or rhonchi  ABDOMEN: Soft, non-tender, non-distended MUSCULOSKELETAL:  No edema; No deformity  SKIN: Warm and dry LOWER EXTREMITIES: no swelling NEUROLOGIC:  Alert and oriented x 3 PSYCHIATRIC:  Normal affect   ASSESSMENT:    1. Essential hypertension   2. Chronic diastolic congestive heart failure (HCC)   3. Nonrheumatic aortic valve stenosis   4. Anginal pain (HCC)   5. OSA (obstructive sleep apnea)   6. Second degree AV block   7.  Pacemaker-mediated tachycardia   8. Hypertension, unspecified type   9. Anemia due to chronic kidney disease, unspecified CKD stage   10. Stage 3 chronic kidney disease, unspecified whether stage 3a or 3b CKD (HCC)   11. Dyspnea on exertion    PLAN:    In order of problems listed above:  Aortic stenosis.  Moderate on last evaluation however she does have some evidence of fluid overload right now.  Will ask her to get echocardiogram to assess the lesion.  I will also assess her proBNP as well as Chem-7. Chronic kidney dysfunction.  Her last creatinine 1.5.  She was given higher dose of diuretics, will check her kidney function potassium today. Essential hypertension blood per slightly elevated but before I start making changes in her medications will get Chem-7. Obstructive sleep apnea followed by antimedicine team. Coronary diastolic congestive heart failure.  Mild decompensation diuretics given not much response will wait for Chem-7 proBNP to make assessment and modify her treatment   Medication Adjustments/Labs and Tests Ordered: Current medicines are reviewed at length with the patient today.  Concerns regarding medicines are outlined above.  Orders Placed This Encounter  Procedures   Pro b natriuretic peptide (BNP)   Basic metabolic panel   EKG 12-Lead   ECHOCARDIOGRAM COMPLETE   Medication changes: No orders of the defined types were placed in this encounter.   Signed, Georgeanna Lea, MD, [facility] 09/07/2022 12:08 PM    [facility]

## 2022-09-08 LAB — BASIC METABOLIC PANEL
BUN/Creatinine Ratio: 15 (ref 12–28)
BUN: 27 mg/dL (ref 8–27)
CO2: 23 mmol/L (ref 20–29)
Calcium: 9.3 mg/dL (ref 8.7–10.3)
Chloride: 95 mmol/L — ABNORMAL LOW (ref 96–106)
Creatinine, Ser: 1.86 mg/dL — ABNORMAL HIGH (ref 0.57–1.00)
Glucose: 142 mg/dL — ABNORMAL HIGH (ref 70–99)
Potassium: 5.1 mmol/L (ref 3.5–5.2)
Sodium: 136 mmol/L (ref 134–144)
eGFR: 29 mL/min/{1.73_m2} — ABNORMAL LOW (ref >59)

## 2022-09-08 LAB — PRO B NATRIURETIC PEPTIDE: NT-Pro BNP: 655 pg/mL — ABNORMAL HIGH (ref 0–301)

## 2022-09-12 ENCOUNTER — Telehealth: Payer: Self-pay

## 2022-09-12 NOTE — Telephone Encounter (Signed)
Spoke with pt regarding lab results per Dr. Vanetta Shawl note. Pt will increase Torsemide to 40mg  in the morning and 20mg  in the evening x 3 days then go back to 20mg  BID. Pt verbalized understanding and had no further questions.

## 2022-09-17 ENCOUNTER — Telehealth: Payer: Self-pay

## 2022-09-17 NOTE — Telephone Encounter (Signed)
Pt called to report how she did with increasing her Torsemide for 3 days. She stated that she was already up 4 pounds and is now back to her starting weight, but she feels weak and is back to her regular dose of Torsemide 20mg  BID now. Per Dr. Bing Matter he would like her to wait a few days and see how she feels.

## 2022-09-21 DIAGNOSIS — E119 Type 2 diabetes mellitus without complications: Secondary | ICD-10-CM | POA: Diagnosis not present

## 2022-09-21 DIAGNOSIS — H524 Presbyopia: Secondary | ICD-10-CM | POA: Diagnosis not present

## 2022-09-21 NOTE — Telephone Encounter (Signed)
Pt is requesting a callback from nurse regarding her updating on how its been since increasing Torsemide. She'd like to discuss further once called back. Please advise

## 2022-09-21 NOTE — Telephone Encounter (Signed)
Spoke with pt who states she is up from 346 to 349.2 ans is noticing she is getting short of breath some. Advised to take an extra dose of Torsemide to see if the weight goes back to normal. PT states she is so weak but will try. Advised if she doesn't feel she can do that go to the ED for evaluation. Pt states she will try and go to the ED if needed. Pt verbalized understanding and had no additional questions.

## 2022-09-25 DIAGNOSIS — E039 Hypothyroidism, unspecified: Secondary | ICD-10-CM | POA: Diagnosis not present

## 2022-09-25 DIAGNOSIS — I5032 Chronic diastolic (congestive) heart failure: Secondary | ICD-10-CM | POA: Diagnosis not present

## 2022-09-25 DIAGNOSIS — E1122 Type 2 diabetes mellitus with diabetic chronic kidney disease: Secondary | ICD-10-CM | POA: Diagnosis not present

## 2022-09-25 DIAGNOSIS — R110 Nausea: Secondary | ICD-10-CM | POA: Diagnosis not present

## 2022-09-25 DIAGNOSIS — R600 Localized edema: Secondary | ICD-10-CM | POA: Diagnosis not present

## 2022-09-25 DIAGNOSIS — I428 Other cardiomyopathies: Secondary | ICD-10-CM | POA: Diagnosis not present

## 2022-09-25 DIAGNOSIS — N1832 Chronic kidney disease, stage 3b: Secondary | ICD-10-CM | POA: Diagnosis not present

## 2022-09-25 DIAGNOSIS — M1990 Unspecified osteoarthritis, unspecified site: Secondary | ICD-10-CM | POA: Diagnosis not present

## 2022-09-25 DIAGNOSIS — E785 Hyperlipidemia, unspecified: Secondary | ICD-10-CM | POA: Diagnosis not present

## 2022-09-25 DIAGNOSIS — Z6841 Body Mass Index (BMI) 40.0 and over, adult: Secondary | ICD-10-CM | POA: Diagnosis not present

## 2022-09-25 DIAGNOSIS — I10 Essential (primary) hypertension: Secondary | ICD-10-CM | POA: Diagnosis not present

## 2022-09-25 DIAGNOSIS — Z794 Long term (current) use of insulin: Secondary | ICD-10-CM | POA: Diagnosis not present

## 2022-09-25 DIAGNOSIS — D539 Nutritional anemia, unspecified: Secondary | ICD-10-CM | POA: Diagnosis not present

## 2022-09-25 NOTE — Progress Notes (Signed)
Remote pacemaker transmission.   

## 2022-10-12 ENCOUNTER — Ambulatory Visit: Payer: Medicare Other | Attending: Cardiology

## 2022-10-12 DIAGNOSIS — R0609 Other forms of dyspnea: Secondary | ICD-10-CM | POA: Diagnosis not present

## 2022-10-12 MED ORDER — PERFLUTREN LIPID MICROSPHERE
1.0000 mL | INTRAVENOUS | Status: AC | PRN
  Administered 2022-10-12: 8 mL via INTRAVENOUS

## 2022-10-13 LAB — ECHOCARDIOGRAM COMPLETE
AR max vel: 0.89 cm2
AV Area VTI: 0.67 cm2
AV Area mean vel: 0.84 cm2
AV Mean grad: 16.8 mm[Hg]
AV Peak grad: 29 mm[Hg]
Ao pk vel: 2.69 m/s
Area-P 1/2: 2.7 cm2
MV M vel: 4.64 m/s
MV Peak grad: 86.1 mm[Hg]
S' Lateral: 2.1 cm

## 2022-10-18 ENCOUNTER — Telehealth: Payer: Self-pay | Admitting: Cardiology

## 2022-10-18 NOTE — Telephone Encounter (Signed)
Patient is requesting call back in regards to echo results. Please advise.

## 2022-10-18 NOTE — Telephone Encounter (Signed)
Called pt to let her know that Dr. Bing Matter has not looked at her Echo results yet. Advised that we would call with results as soon as we have them. Pt verbalized understanding and had no further questions.

## 2022-10-22 ENCOUNTER — Telehealth: Payer: Self-pay

## 2022-10-22 NOTE — Telephone Encounter (Signed)
Echo Results reviewed with pt as per Dr. Krasowski's note.  Pt verbalized understanding and had no additional questions. Routed to PCP 

## 2022-10-24 ENCOUNTER — Telehealth: Payer: Self-pay | Admitting: Cardiology

## 2022-10-24 NOTE — Telephone Encounter (Signed)
Pt c/o medication issue:  1. Name of Medication:   torsemide (DEMADEX) 20 MG tablet    2. How are you currently taking this medication (dosage and times per day)?    3. Are you having a reaction (difficulty breathing--STAT)? no  4. What is your medication issue? Patient need a new prescription sent to  [facility] - [place], [place] - [address] ) but states the directions needs to change. She stated that she was told to take it as needed. Please advise

## 2022-10-26 ENCOUNTER — Other Ambulatory Visit: Payer: Self-pay

## 2022-10-26 MED ORDER — TORSEMIDE 20 MG PO TABS: ORAL_TABLET | ORAL | 3 refills | Status: DC

## 2022-10-26 NOTE — Telephone Encounter (Signed)
Pt calling to check status of refill request. Will take last one tomorrow

## 2022-10-29 ENCOUNTER — Ambulatory Visit: Payer: Medicare Other | Attending: Cardiology | Admitting: Cardiology

## 2022-10-29 ENCOUNTER — Encounter: Payer: Self-pay | Admitting: Cardiology

## 2022-10-29 VITALS — BP 136/84 | HR 70 | Ht 62.0 in | Wt 350.8 lb

## 2022-10-29 DIAGNOSIS — I441 Atrioventricular block, second degree: Secondary | ICD-10-CM | POA: Insufficient documentation

## 2022-10-29 DIAGNOSIS — I10 Essential (primary) hypertension: Secondary | ICD-10-CM | POA: Insufficient documentation

## 2022-10-29 NOTE — Progress Notes (Signed)
Electrophysiology Office Note:   Date:  10/29/2022  ID:  Vickie Ferguson, DOB 1955/01/11, MRN [id number]  Primary Cardiologist: Gypsy Balsam, MD Electrophysiologist: Regan Lemming, MD      History of Present Illness:   Vickie Ferguson is a 67 y.o. female with h/o hypertension, diabetes, sleep apnea, second-degree AV block seen today for routine electrophysiology followup.   Since last being seen in our clinic the patient reports doing well from an arrhythmia standpoint.  She has no questions about her pacemaker.  She continues to do her daily activities, though is restricted by her chronic medical issues.  she denies chest pain, palpitations, dyspnea, PND, orthopnea, nausea, vomiting, dizziness, syncope, edema, weight gain, or early satiety.   Review of systems complete and found to be negative unless listed in HPI.      EP Information / Studies Reviewed:    EKG is not ordered today. EKG from 9/6/245 reviewed which showed sinus rhythm, V paced      PPM Interrogation-  reviewed in detail today,  See PACEART report.  Device History: Abbott Dual Chamber PPM implanted 08/15/2018 for Second Degree AV block  Risk Assessment/Calculations:              Physical Exam:   VS:  BP 136/84   Pulse 70   Ht 5\' 2"  (1.575 m)   Wt (!) 350 lb 12.8 oz (159.1 kg)   SpO2 97%   BMI 64.16 kg/m    Wt Readings from Last 3 Encounters:  10/29/22 (!) 350 lb 12.8 oz (159.1 kg)  09/07/22 (!) 346 lb 3.2 oz (157 kg)  01/30/22 (!) 326 lb 9.6 oz (148.1 kg)     GEN: Well nourished, well developed in no acute distress NECK: No JVD; No carotid bruits CARDIAC: Regular rate and rhythm, no murmurs, rubs, gallops RESPIRATORY:  Clear to auscultation without rales, wheezing or rhonchi  ABDOMEN: Soft, non-tender, non-distended EXTREMITIES:  No edema; No deformity   ASSESSMENT AND PLAN:    Second Degree AV block s/p Abbott PPM  Normal PPM function See Pace Art report No changes  today  2.  Hypertension: well controlled that was impressive  Disposition:   Follow up with Dr. Elberta Fortis in 12 months  Signed, Farley Crooker Jorja Loa, MD

## 2022-10-30 NOTE — Telephone Encounter (Signed)
Patient aware medication sent on 10/25

## 2022-11-03 LAB — CUP PACEART INCLINIC DEVICE CHECK
Battery Remaining Longevity: 58 mo
Battery Voltage: 2.99 V
Brady Statistic RA Percent Paced: 47 %
Brady Statistic RV Percent Paced: 99.96 %
Date Time Interrogation Session: 20241028144131
Implantable Lead Connection Status: 753985
Implantable Lead Connection Status: 753985
Implantable Lead Implant Date: 20200814
Implantable Lead Implant Date: 20200814
Implantable Lead Location: 753859
Implantable Lead Location: 753860
Implantable Pulse Generator Implant Date: 20200814
Lead Channel Impedance Value: 437.5 Ohm
Lead Channel Impedance Value: 437.5 Ohm
Lead Channel Pacing Threshold Amplitude: 0.5 V
Lead Channel Pacing Threshold Amplitude: 0.5 V
Lead Channel Pacing Threshold Amplitude: 1 V
Lead Channel Pacing Threshold Amplitude: 1 V
Lead Channel Pacing Threshold Pulse Width: 0.5 ms
Lead Channel Pacing Threshold Pulse Width: 0.5 ms
Lead Channel Pacing Threshold Pulse Width: 0.5 ms
Lead Channel Pacing Threshold Pulse Width: 0.5 ms
Lead Channel Sensing Intrinsic Amplitude: 2.8 mV
Lead Channel Sensing Intrinsic Amplitude: 7.3 mV
Lead Channel Setting Pacing Amplitude: 2.5 V
Lead Channel Setting Pacing Amplitude: 2.5 V
Lead Channel Setting Pacing Pulse Width: 0.5 ms
Lead Channel Setting Sensing Sensitivity: 2 mV
Pulse Gen Model: 2272
Pulse Gen Serial Number: 9154171

## 2022-11-06 ENCOUNTER — Other Ambulatory Visit: Payer: Self-pay | Admitting: Cardiology

## 2022-11-08 ENCOUNTER — Ambulatory Visit: Payer: Medicare Other | Admitting: Cardiology

## 2022-11-08 ENCOUNTER — Ambulatory Visit (INDEPENDENT_AMBULATORY_CARE_PROVIDER_SITE_OTHER): Payer: Medicare Other

## 2022-11-08 DIAGNOSIS — I441 Atrioventricular block, second degree: Secondary | ICD-10-CM | POA: Diagnosis not present

## 2022-11-08 LAB — CUP PACEART REMOTE DEVICE CHECK
Battery Remaining Longevity: 58 mo
Battery Remaining Percentage: 60 %
Battery Voltage: 2.99 V
Brady Statistic AP VP Percent: 39 %
Brady Statistic AP VS Percent: 1 %
Brady Statistic AS VP Percent: 61 %
Brady Statistic AS VS Percent: 1 %
Brady Statistic RA Percent Paced: 39 %
Brady Statistic RV Percent Paced: 99 %
Date Time Interrogation Session: 20241107030510
Implantable Lead Connection Status: 753985
Implantable Lead Connection Status: 753985
Implantable Lead Implant Date: 20200814
Implantable Lead Implant Date: 20200814
Implantable Lead Location: 753859
Implantable Lead Location: 753860
Implantable Pulse Generator Implant Date: 20200814
Lead Channel Impedance Value: 410 Ohm
Lead Channel Impedance Value: 430 Ohm
Lead Channel Pacing Threshold Amplitude: 0.5 V
Lead Channel Pacing Threshold Amplitude: 1 V
Lead Channel Pacing Threshold Pulse Width: 0.5 ms
Lead Channel Pacing Threshold Pulse Width: 0.5 ms
Lead Channel Sensing Intrinsic Amplitude: 1.4 mV
Lead Channel Sensing Intrinsic Amplitude: 7.3 mV
Lead Channel Setting Pacing Amplitude: 2.5 V
Lead Channel Setting Pacing Amplitude: 2.5 V
Lead Channel Setting Pacing Pulse Width: 0.5 ms
Lead Channel Setting Sensing Sensitivity: 2 mV
Pulse Gen Model: 2272
Pulse Gen Serial Number: 9154171

## 2022-11-20 NOTE — Progress Notes (Signed)
Remote pacemaker transmission.   

## 2022-11-22 DIAGNOSIS — Z0000 Encounter for general adult medical examination without abnormal findings: Secondary | ICD-10-CM | POA: Diagnosis not present

## 2022-11-22 DIAGNOSIS — Z9181 History of falling: Secondary | ICD-10-CM | POA: Diagnosis not present

## 2022-11-26 ENCOUNTER — Other Ambulatory Visit: Payer: Self-pay | Admitting: Cardiology

## 2022-12-31 DIAGNOSIS — E1122 Type 2 diabetes mellitus with diabetic chronic kidney disease: Secondary | ICD-10-CM | POA: Diagnosis not present

## 2022-12-31 DIAGNOSIS — K1379 Other lesions of oral mucosa: Secondary | ICD-10-CM | POA: Diagnosis not present

## 2022-12-31 DIAGNOSIS — H6690 Otitis media, unspecified, unspecified ear: Secondary | ICD-10-CM | POA: Diagnosis not present

## 2022-12-31 DIAGNOSIS — E039 Hypothyroidism, unspecified: Secondary | ICD-10-CM | POA: Diagnosis not present

## 2022-12-31 DIAGNOSIS — N1832 Chronic kidney disease, stage 3b: Secondary | ICD-10-CM | POA: Diagnosis not present

## 2022-12-31 DIAGNOSIS — Z794 Long term (current) use of insulin: Secondary | ICD-10-CM | POA: Diagnosis not present

## 2022-12-31 DIAGNOSIS — D539 Nutritional anemia, unspecified: Secondary | ICD-10-CM | POA: Diagnosis not present

## 2022-12-31 DIAGNOSIS — I5032 Chronic diastolic (congestive) heart failure: Secondary | ICD-10-CM | POA: Diagnosis not present

## 2022-12-31 DIAGNOSIS — E785 Hyperlipidemia, unspecified: Secondary | ICD-10-CM | POA: Diagnosis not present

## 2022-12-31 DIAGNOSIS — R110 Nausea: Secondary | ICD-10-CM | POA: Diagnosis not present

## 2022-12-31 DIAGNOSIS — I428 Other cardiomyopathies: Secondary | ICD-10-CM | POA: Diagnosis not present

## 2022-12-31 DIAGNOSIS — Z6841 Body Mass Index (BMI) 40.0 and over, adult: Secondary | ICD-10-CM | POA: Diagnosis not present

## 2022-12-31 DIAGNOSIS — R600 Localized edema: Secondary | ICD-10-CM | POA: Diagnosis not present

## 2023-01-24 ENCOUNTER — Ambulatory Visit: Payer: Medicare Other | Admitting: Oncology

## 2023-01-24 ENCOUNTER — Other Ambulatory Visit: Payer: Medicare Other

## 2023-02-07 ENCOUNTER — Ambulatory Visit (INDEPENDENT_AMBULATORY_CARE_PROVIDER_SITE_OTHER): Payer: Medicare Other

## 2023-02-07 DIAGNOSIS — I441 Atrioventricular block, second degree: Secondary | ICD-10-CM | POA: Diagnosis not present

## 2023-02-07 LAB — CUP PACEART REMOTE DEVICE CHECK
Battery Remaining Longevity: 55 mo
Battery Remaining Percentage: 57 %
Battery Voltage: 2.99 V
Brady Statistic AP VP Percent: 49 %
Brady Statistic AP VS Percent: 1 %
Brady Statistic AS VP Percent: 50 %
Brady Statistic AS VS Percent: 1 %
Brady Statistic RA Percent Paced: 49 %
Brady Statistic RV Percent Paced: 99 %
Date Time Interrogation Session: 20250206054627
Implantable Lead Connection Status: 753985
Implantable Lead Connection Status: 753985
Implantable Lead Implant Date: 20200814
Implantable Lead Implant Date: 20200814
Implantable Lead Location: 753859
Implantable Lead Location: 753860
Implantable Pulse Generator Implant Date: 20200814
Lead Channel Impedance Value: 400 Ohm
Lead Channel Impedance Value: 430 Ohm
Lead Channel Pacing Threshold Amplitude: 0.5 V
Lead Channel Pacing Threshold Amplitude: 1 V
Lead Channel Pacing Threshold Pulse Width: 0.5 ms
Lead Channel Pacing Threshold Pulse Width: 0.5 ms
Lead Channel Sensing Intrinsic Amplitude: 2.6 mV
Lead Channel Sensing Intrinsic Amplitude: 7.3 mV
Lead Channel Setting Pacing Amplitude: 2.5 V
Lead Channel Setting Pacing Amplitude: 2.5 V
Lead Channel Setting Pacing Pulse Width: 0.5 ms
Lead Channel Setting Sensing Sensitivity: 2 mV
Pulse Gen Model: 2272
Pulse Gen Serial Number: 9154171

## 2023-02-11 ENCOUNTER — Ambulatory Visit: Payer: Medicare Other | Admitting: Cardiology

## 2023-03-13 NOTE — Progress Notes (Signed)
 Remote pacemaker transmission.

## 2023-04-03 ENCOUNTER — Encounter: Payer: Self-pay | Admitting: Cardiology

## 2023-04-04 ENCOUNTER — Ambulatory Visit: Payer: Medicare Other | Attending: Cardiology | Admitting: Cardiology

## 2023-04-04 ENCOUNTER — Encounter: Payer: Self-pay | Admitting: Cardiology

## 2023-04-04 VITALS — BP 142/72 | HR 78 | Ht 64.0 in | Wt 359.0 lb

## 2023-04-04 DIAGNOSIS — I5032 Chronic diastolic (congestive) heart failure: Secondary | ICD-10-CM | POA: Diagnosis not present

## 2023-04-04 DIAGNOSIS — R0609 Other forms of dyspnea: Secondary | ICD-10-CM | POA: Diagnosis not present

## 2023-04-04 DIAGNOSIS — Z950 Presence of cardiac pacemaker: Secondary | ICD-10-CM | POA: Diagnosis not present

## 2023-04-04 DIAGNOSIS — I10 Essential (primary) hypertension: Secondary | ICD-10-CM | POA: Diagnosis not present

## 2023-04-04 DIAGNOSIS — I350 Nonrheumatic aortic (valve) stenosis: Secondary | ICD-10-CM | POA: Insufficient documentation

## 2023-04-04 DIAGNOSIS — R002 Palpitations: Secondary | ICD-10-CM | POA: Insufficient documentation

## 2023-04-04 MED ORDER — TORSEMIDE 20 MG PO TABS: 20.0000 mg | ORAL_TABLET | Freq: Three times a day (TID) | ORAL | 3 refills | Status: AC

## 2023-04-04 NOTE — Addendum Note (Signed)
 Addended by: Baldo Ash D on: 04/04/2023 10:36 AM   Modules accepted: Orders

## 2023-04-04 NOTE — Patient Instructions (Addendum)
 Medication Instructions:   INCREASE: Torsemide to 20mg  - 3 times daily   Lab Work: BMP, ProBNP- today If you have labs (blood work) drawn today and your tests are completely normal, you will receive your results only by: MyChart Message (if you have MyChart) OR A paper copy in the mail If you have any lab test that is abnormal or we need to change your treatment, we will call you to review the results.   Testing/Procedures: Your physician has requested that you have an echocardiogram. Echocardiography is a painless test that uses sound waves to create images of your heart. It provides your doctor with information about the size and shape of your heart and how well your heart's chambers and valves are working. This procedure takes approximately one hour. There are no restrictions for this procedure. Please do NOT wear cologne, perfume, aftershave, or lotions (deodorant is allowed). Please arrive 15 minutes prior to your appointment time.  Please note: We ask at that you not bring children with you during ultrasound (echo/ vascular) testing. Due to room size and safety concerns, children are not allowed in the ultrasound rooms during exams. Our front office staff cannot provide observation of children in our lobby area while testing is being conducted. An adult accompanying a patient to their appointment will only be allowed in the ultrasound room at the discretion of the ultrasound technician under special circumstances. We apologize for any inconvenience.    Follow-Up: At [facility], you and your health needs are our priority.  As part of our continuing mission to provide you with exceptional heart care, we have created designated Provider Care Teams.  These Care Teams include your primary Cardiologist (physician) and Advanced Practice Providers (APPs -  Physician Assistants and Nurse Practitioners) who all work together to provide you with the care you need, when you need it.  We  recommend signing up for the patient portal called "MyChart".  Sign up information is provided on this After Visit Summary.  MyChart is used to connect with patients for Virtual Visits (Telemedicine).  Patients are able to view lab/test results, encounter notes, upcoming appointments, etc.  Non-urgent messages can be sent to your provider as well.   To learn more about what you can do with MyChart, go to [email or web address].    Your next appointment:   6 week(s)  The format for your next appointment:   In Person  Provider:   Gypsy Balsam, MD    Other Instructions NA

## 2023-04-04 NOTE — Progress Notes (Signed)
 Cardiology Office Note:    Date:  04/04/2023   ID:  Vickie Ferguson, DOB Sep 10, 1955, MRN [id number]  PCP:  Hurshel Party, NP  Cardiologist:  Gypsy Balsam, MD    Referring MD: Hurshel Party, NP   Chief Complaint  Patient presents with   CHF follow up   Shortness of Breath   increased edema   Chest Pain    History of Present Illness:    Vickie Ferguson is a 68 y.o. female past medical history significant for diastolic congestive heart failure, aortic stenosis previously assessed as moderate last echocardiogram done in October describes mild however when I look very careful in the parameters regarding critical still being severe, echocardiogram will be repeated to clarify that.  Essential hypertension, morbid obesity, dyslipidemia, ventricular ectopy.  Comes today to my office she is not doing well she cries actually in the office her brother just died recently she complained of being short of breath tired sometimes getting even chest pain.  Past Medical History:  Diagnosis Date   Anemia    Anginal pain (HCC)    Aortic stenosis 03/21/2020   Arthritis    CKD (chronic kidney disease), stage III (HCC)    Complication of anesthesia    Congestive heart failure (CHF) (HCC) 06/18/2019   Diabetes mellitus without complication (HCC)    Essential hypertension 07/23/2014   Family history of adverse reaction to anesthesia    " FAMILY MEMBERS HAD DIFFICULTY WAKING "   Hypertension    Hypothyroidism    Iron deficiency anemia 07/23/2014   Long term current use of insulin (HCC) 01/07/2017   Morbid (severe) obesity due to excess calories (HCC) 07/23/2014   OSA (obstructive sleep apnea) 04/26/2017   Other long term (current) drug therapy 01/07/2017   Pacemaker Saint Jude device 09/01/2018   Palpitations 11/19/2019   PONV (postoperative nausea and vomiting)    Precordial pain 07/23/2014   Presence of permanent cardiac pacemaker 08/15/2018   St Jude Medical Assurity MRI dual-chamber pacemaker  for symptomatic bradycardia   Pressure injury of skin 06/17/2018   Second degree AV block    Shortness of breath 04/26/2017   TIA (transient ischemic attack) 04/26/2017   Type 2 diabetes mellitus without complication (HCC) 07/23/2014   Unstable angina (HCC) 06/17/2018   Ventricular tachycardia (paroxysmal) (HCC) 07/23/2014    Past Surgical History:  Procedure Laterality Date   CARDIAC CATHETERIZATION     INSERT / REPLACE / REMOVE PACEMAKER  08/15/2018   St Jude Medical Assurity MRI dual-chamber pacemaker for symptomatic bradycardia   LEFT HEART CATH AND CORONARY ANGIOGRAPHY N/A 06/17/2018   Procedure: LEFT HEART CATH AND CORONARY ANGIOGRAPHY;  Surgeon: Swaziland, Peter M, MD;  Location: [facility] INVASIVE CV LAB;  Service: Cardiovascular;  Laterality: N/A;   PACEMAKER IMPLANT N/A 08/15/2018   Procedure: PACEMAKER IMPLANT;  Surgeon: Regan Lemming, MD;  Location: [facility] INVASIVE CV LAB;  Service: Cardiovascular;  Laterality: N/A;   SHOULDER SURGERY Right 2012   METAL PLATE    TOTAL KNEE ARTHROPLASTY Right 2001    Current Medications: Current Meds  Medication Sig   acetaminophen (TYLENOL) 500 MG tablet Take 500 mg by mouth every 6 (six) hours as needed for mild pain or headache.   atorvastatin (LIPITOR) 40 MG tablet Take 1 tablet (40 mg total) by mouth daily.   clopidogrel (PLAVIX) 75 MG tablet Take 1 tablet (75 mg total) by mouth daily.   dexlansoprazole (DEXILANT) 60 MG capsule Take 60 mg by  mouth daily.    fluticasone (FLONASE) 50 MCG/ACT nasal spray Place 1 spray into both nostrils daily as needed for allergies. For allergies   Insulin Degludec (TRESIBA [place]) Inject 40 Units into the skin daily.   levothyroxine (SYNTHROID, LEVOTHROID) 75 MCG tablet Take 75 mcg by mouth daily before breakfast.    metoprolol tartrate (LOPRESSOR) 50 MG tablet TAKE ONE TABLET BY MOUTH TWICE DAILY   nitroGLYCERIN (NITROSTAT) 0.4 MG SL tablet Place 1 tablet (0.4 mg total) under the tongue every 5 (five) minutes as  needed. (Patient taking differently: Place 0.4 mg under the tongue every 5 (five) minutes as needed for chest pain.)   Oxycodone HCl 10 MG TABS Take 1 tablet by mouth as needed (pain).   polyvinyl alcohol (LIQUIFILM TEARS) 1.4 % ophthalmic solution Place 1 drop into both eyes as needed for dry eyes.   torsemide (DEMADEX) 20 MG tablet Take 1 tablet twice daily. Pt may take extra 20mg  if her weight increases 3lbs overnight (Patient taking differently: Take 20 mg by mouth See admin instructions. Take 1 tablet twice daily. Pt may take extra 20mg  if her weight increases 3lbs overnight)     Allergies:   Aleve [naproxen sodium], Procainamide, Rythmol [propafenone hcl], Codeine, Doxycycline, Isosorbide nitrate, and Sertraline   Social History   Socioeconomic History   Marital status: Legally Separated    Spouse name: Not on file   Number of children: Not on file   Years of education: Not on file   Highest education level: Not on file  Occupational History   Not on file  Tobacco Use   Smoking status: Never   Smokeless tobacco: Never  Vaping Use   Vaping status: Never Used  Substance and Sexual Activity   Alcohol use: No    Alcohol/week: 0.0 standard drinks of alcohol   Drug use: No   Sexual activity: Not on file  Other Topics Concern   Not on file  Social History Narrative   Not on file   Social Drivers of Health   Financial Resource Strain: Not on file  Food Insecurity: Not on file  Transportation Needs: Not on file  Physical Activity: Not on file  Stress: Not on file  Social Connections: Not on file     Family History: [name] family history includes Heart disease in her mother. ROS:   Please see the history of present illness.    All 14 point review of systems negative except as described per history of present illness  EKGs/Labs/Other Studies Reviewed:         Recent Labs: 09/07/2022: BUN 27; Creatinine, Ser 1.86; NT-Pro BNP 655; Potassium 5.1; Sodium 136   Recent Lipid Panel No results found for: "CHOL", "TRIG", "HDL", "CHOLHDL", "VLDL", "LDLCALC", "LDLDIRECT"  Physical Exam:    VS:  BP (!) 142/72 (BP Location: Right Arm, Patient Position: Sitting)   Pulse 78   Ht 5\' 4"  (1.626 m)   Wt (!) 359 lb (162.8 kg)   SpO2 98%   BMI 61.62 kg/m     Wt Readings from Last 3 Encounters:  04/04/23 (!) 359 lb (162.8 kg)  10/29/22 (!) 350 lb 12.8 oz (159.1 kg)  09/07/22 (!) 346 lb 3.2 oz (157 kg)     GEN:  Well nourished, well developed in no acute distress HEENT: Normal NECK: No JVD; No carotid bruits LYMPHATICS: No lymphadenopathy CARDIAC: RRR, systolic ejection murmur grade 3/6 best heard right upper portion of the sternum S2 is soft no rubs, no gallops  RESPIRATORY:  Clear to auscultation without rales, wheezing or rhonchi  ABDOMEN: Soft, non-tender, non-distended MUSCULOSKELETAL:  No edema; No deformity  SKIN: Warm and dry LOWER EXTREMITIES: no swelling NEUROLOGIC:  Alert and oriented x 3 PSYCHIATRIC:  Normal affect   ASSESSMENT:    1. Chronic diastolic congestive heart failure (HCC)   2. Nonrheumatic aortic valve stenosis   3. Essential hypertension   4. Palpitations   5. Presence of permanent cardiac pacemaker    PLAN:    In order of problems listed above:  Chronic diastolic congestive heart failure.  I am worried about her aortic stenosis last echocardiogram done in October was interpreted as mild aortic stenosis however dimension index aortic valve area of low stroke-volume suggest low-flow low gradient probably severe arctic stenosis biggest issue is obviously Chelsie's morbid obesity I do not think she be candidate for any surgery but this is something to consider.  I will schedule her to have another echocardiogram. Chronic diastolic congestive heart failure mildly decompensated today we will decrease increase dose of torsemide slightly check proBNP Chem-7. Essential hypertension, blood pressure on the higher side hopefully will  be better with increasing dose of diuretics but we need to be careful because of suspicion for significant aortic stenosis. Pacemaker, noted, function normal   Medication Adjustments/Labs and Tests Ordered: Current medicines are reviewed at length with the patient today.  Concerns regarding medicines are outlined above.  No orders of the defined types were placed in this encounter.  Medication changes: No orders of the defined types were placed in this encounter.    Signed, Georgeanna Lea, MD, [facility] 04/04/2023 10:17 AM    [facility]

## 2023-04-05 LAB — BASIC METABOLIC PANEL WITH GFR
BUN/Creatinine Ratio: 15 (ref 12–28)
BUN: 27 mg/dL (ref 8–27)
CO2: 23 mmol/L (ref 20–29)
Calcium: 9.5 mg/dL (ref 8.7–10.3)
Chloride: 95 mmol/L — ABNORMAL LOW (ref 96–106)
Creatinine, Ser: 1.81 mg/dL — ABNORMAL HIGH (ref 0.57–1.00)
Glucose: 198 mg/dL — ABNORMAL HIGH (ref 70–99)
Potassium: 5.2 mmol/L (ref 3.5–5.2)
Sodium: 135 mmol/L (ref 134–144)
eGFR: 30 mL/min/{1.73_m2} — ABNORMAL LOW (ref >59)

## 2023-04-05 LAB — PRO B NATRIURETIC PEPTIDE: NT-Pro BNP: 826 pg/mL — ABNORMAL HIGH (ref 0–301)

## 2023-04-08 ENCOUNTER — Telehealth: Payer: Self-pay | Admitting: Cardiology

## 2023-04-08 DIAGNOSIS — I472 Ventricular tachycardia, unspecified: Secondary | ICD-10-CM | POA: Diagnosis not present

## 2023-04-08 DIAGNOSIS — G4700 Insomnia, unspecified: Secondary | ICD-10-CM | POA: Diagnosis not present

## 2023-04-08 DIAGNOSIS — Z6841 Body Mass Index (BMI) 40.0 and over, adult: Secondary | ICD-10-CM | POA: Diagnosis not present

## 2023-04-08 DIAGNOSIS — E559 Vitamin D deficiency, unspecified: Secondary | ICD-10-CM | POA: Diagnosis not present

## 2023-04-08 DIAGNOSIS — F32A Depression, unspecified: Secondary | ICD-10-CM | POA: Diagnosis not present

## 2023-04-08 DIAGNOSIS — E039 Hypothyroidism, unspecified: Secondary | ICD-10-CM | POA: Diagnosis not present

## 2023-04-08 DIAGNOSIS — I350 Nonrheumatic aortic (valve) stenosis: Secondary | ICD-10-CM | POA: Diagnosis not present

## 2023-04-08 DIAGNOSIS — I2510 Atherosclerotic heart disease of native coronary artery without angina pectoris: Secondary | ICD-10-CM | POA: Diagnosis not present

## 2023-04-08 DIAGNOSIS — I5033 Acute on chronic diastolic (congestive) heart failure: Secondary | ICD-10-CM | POA: Diagnosis not present

## 2023-04-08 DIAGNOSIS — G8929 Other chronic pain: Secondary | ICD-10-CM | POA: Diagnosis not present

## 2023-04-08 DIAGNOSIS — D509 Iron deficiency anemia, unspecified: Secondary | ICD-10-CM | POA: Diagnosis not present

## 2023-04-08 DIAGNOSIS — I441 Atrioventricular block, second degree: Secondary | ICD-10-CM | POA: Diagnosis not present

## 2023-04-08 DIAGNOSIS — M8580 Other specified disorders of bone density and structure, unspecified site: Secondary | ICD-10-CM | POA: Diagnosis not present

## 2023-04-08 DIAGNOSIS — N184 Chronic kidney disease, stage 4 (severe): Secondary | ICD-10-CM | POA: Diagnosis not present

## 2023-04-08 DIAGNOSIS — Z794 Long term (current) use of insulin: Secondary | ICD-10-CM | POA: Diagnosis not present

## 2023-04-08 DIAGNOSIS — D510 Vitamin B12 deficiency anemia due to intrinsic factor deficiency: Secondary | ICD-10-CM | POA: Diagnosis not present

## 2023-04-08 DIAGNOSIS — G4733 Obstructive sleep apnea (adult) (pediatric): Secondary | ICD-10-CM | POA: Diagnosis not present

## 2023-04-08 DIAGNOSIS — E7800 Pure hypercholesterolemia, unspecified: Secondary | ICD-10-CM | POA: Diagnosis not present

## 2023-04-08 DIAGNOSIS — K219 Gastro-esophageal reflux disease without esophagitis: Secondary | ICD-10-CM | POA: Diagnosis not present

## 2023-04-08 DIAGNOSIS — D631 Anemia in chronic kidney disease: Secondary | ICD-10-CM | POA: Diagnosis not present

## 2023-04-08 DIAGNOSIS — F419 Anxiety disorder, unspecified: Secondary | ICD-10-CM | POA: Diagnosis not present

## 2023-04-08 DIAGNOSIS — I130 Hypertensive heart and chronic kidney disease with heart failure and stage 1 through stage 4 chronic kidney disease, or unspecified chronic kidney disease: Secondary | ICD-10-CM | POA: Diagnosis not present

## 2023-04-08 DIAGNOSIS — M1990 Unspecified osteoarthritis, unspecified site: Secondary | ICD-10-CM | POA: Diagnosis not present

## 2023-04-08 DIAGNOSIS — E1122 Type 2 diabetes mellitus with diabetic chronic kidney disease: Secondary | ICD-10-CM | POA: Diagnosis not present

## 2023-04-08 NOTE — Telephone Encounter (Signed)
 Spoke with Lyla Son- Advised per Dr. Bing Matter that he would sign HH orders and only needs BMP this week.

## 2023-04-08 NOTE — Telephone Encounter (Signed)
 RN Lyla Son a home health nurse was requesting orders for 2 week one, 1 week four, 1 month two with 3 PRMs. RN Lyla Son stated Dr. Bing Matter was requesting labs for a BMP and would like to know if Dr. Bing Matter was wanting other labs drawn with the BMP. Please advise.

## 2023-04-09 ENCOUNTER — Telehealth: Payer: Self-pay | Admitting: Cardiology

## 2023-04-09 DIAGNOSIS — N184 Chronic kidney disease, stage 4 (severe): Secondary | ICD-10-CM | POA: Diagnosis not present

## 2023-04-09 DIAGNOSIS — E1122 Type 2 diabetes mellitus with diabetic chronic kidney disease: Secondary | ICD-10-CM | POA: Diagnosis not present

## 2023-04-09 DIAGNOSIS — I130 Hypertensive heart and chronic kidney disease with heart failure and stage 1 through stage 4 chronic kidney disease, or unspecified chronic kidney disease: Secondary | ICD-10-CM | POA: Diagnosis not present

## 2023-04-09 DIAGNOSIS — D631 Anemia in chronic kidney disease: Secondary | ICD-10-CM | POA: Diagnosis not present

## 2023-04-09 DIAGNOSIS — I5033 Acute on chronic diastolic (congestive) heart failure: Secondary | ICD-10-CM | POA: Diagnosis not present

## 2023-04-09 NOTE — Telephone Encounter (Signed)
 LVM for Barbara Cower to call regarding message

## 2023-04-09 NOTE — Telephone Encounter (Signed)
 Caller Barbara Cower), physical therapist stated he wants to get new order for patient's physical therapy.  Caller stated orders should read one more time this week and once a week for 3 weeks.

## 2023-04-10 NOTE — Telephone Encounter (Signed)
 Vickie Ferguson was returning nurse call and requesting a callback. Please advise

## 2023-04-10 NOTE — Telephone Encounter (Signed)
 Spoke with Barbara Cower and approved PT orders for Pt per Dr. Bing Matter.

## 2023-04-11 DIAGNOSIS — N184 Chronic kidney disease, stage 4 (severe): Secondary | ICD-10-CM | POA: Diagnosis not present

## 2023-04-11 DIAGNOSIS — E875 Hyperkalemia: Secondary | ICD-10-CM | POA: Diagnosis not present

## 2023-04-11 DIAGNOSIS — I5022 Chronic systolic (congestive) heart failure: Secondary | ICD-10-CM | POA: Diagnosis not present

## 2023-04-11 DIAGNOSIS — Z5181 Encounter for therapeutic drug level monitoring: Secondary | ICD-10-CM | POA: Diagnosis not present

## 2023-04-11 DIAGNOSIS — E1122 Type 2 diabetes mellitus with diabetic chronic kidney disease: Secondary | ICD-10-CM | POA: Diagnosis not present

## 2023-04-11 DIAGNOSIS — I130 Hypertensive heart and chronic kidney disease with heart failure and stage 1 through stage 4 chronic kidney disease, or unspecified chronic kidney disease: Secondary | ICD-10-CM | POA: Diagnosis not present

## 2023-04-11 DIAGNOSIS — I5033 Acute on chronic diastolic (congestive) heart failure: Secondary | ICD-10-CM | POA: Diagnosis not present

## 2023-04-11 DIAGNOSIS — D631 Anemia in chronic kidney disease: Secondary | ICD-10-CM | POA: Diagnosis not present

## 2023-04-11 LAB — LAB REPORT - SCANNED: EGFR: 30

## 2023-04-12 ENCOUNTER — Telehealth: Payer: Self-pay

## 2023-04-12 DIAGNOSIS — D631 Anemia in chronic kidney disease: Secondary | ICD-10-CM | POA: Diagnosis not present

## 2023-04-12 DIAGNOSIS — E1122 Type 2 diabetes mellitus with diabetic chronic kidney disease: Secondary | ICD-10-CM | POA: Diagnosis not present

## 2023-04-12 DIAGNOSIS — I130 Hypertensive heart and chronic kidney disease with heart failure and stage 1 through stage 4 chronic kidney disease, or unspecified chronic kidney disease: Secondary | ICD-10-CM | POA: Diagnosis not present

## 2023-04-12 DIAGNOSIS — I5033 Acute on chronic diastolic (congestive) heart failure: Secondary | ICD-10-CM | POA: Diagnosis not present

## 2023-04-12 DIAGNOSIS — N184 Chronic kidney disease, stage 4 (severe): Secondary | ICD-10-CM | POA: Diagnosis not present

## 2023-04-12 NOTE — Telephone Encounter (Signed)
 Lab Results reviewed with pt as per Dr. Vanetta Shawl note.  Pt verbalized understanding and had no additional questions. Routed to PCP

## 2023-04-15 DIAGNOSIS — E1122 Type 2 diabetes mellitus with diabetic chronic kidney disease: Secondary | ICD-10-CM | POA: Diagnosis not present

## 2023-04-15 DIAGNOSIS — I5033 Acute on chronic diastolic (congestive) heart failure: Secondary | ICD-10-CM | POA: Diagnosis not present

## 2023-04-15 DIAGNOSIS — N184 Chronic kidney disease, stage 4 (severe): Secondary | ICD-10-CM | POA: Diagnosis not present

## 2023-04-15 DIAGNOSIS — I130 Hypertensive heart and chronic kidney disease with heart failure and stage 1 through stage 4 chronic kidney disease, or unspecified chronic kidney disease: Secondary | ICD-10-CM | POA: Diagnosis not present

## 2023-04-15 DIAGNOSIS — D631 Anemia in chronic kidney disease: Secondary | ICD-10-CM | POA: Diagnosis not present

## 2023-04-16 ENCOUNTER — Telehealth: Payer: Self-pay | Admitting: Cardiology

## 2023-04-16 DIAGNOSIS — D631 Anemia in chronic kidney disease: Secondary | ICD-10-CM | POA: Diagnosis not present

## 2023-04-16 DIAGNOSIS — I130 Hypertensive heart and chronic kidney disease with heart failure and stage 1 through stage 4 chronic kidney disease, or unspecified chronic kidney disease: Secondary | ICD-10-CM | POA: Diagnosis not present

## 2023-04-16 DIAGNOSIS — I5033 Acute on chronic diastolic (congestive) heart failure: Secondary | ICD-10-CM | POA: Diagnosis not present

## 2023-04-16 DIAGNOSIS — N184 Chronic kidney disease, stage 4 (severe): Secondary | ICD-10-CM | POA: Diagnosis not present

## 2023-04-16 DIAGNOSIS — E1122 Type 2 diabetes mellitus with diabetic chronic kidney disease: Secondary | ICD-10-CM | POA: Diagnosis not present

## 2023-04-16 MED ORDER — NITROGLYCERIN 0.4 MG SL SUBL: 0.4000 mg | SUBLINGUAL_TABLET | SUBLINGUAL | 2 refills | Status: AC | PRN

## 2023-04-16 NOTE — Telephone Encounter (Signed)
*  STAT* If patient is at the pharmacy, call can be transferred to refill team.   1. Which medications need to be refilled? (please list name of each medication and dose if known)   nitroGLYCERIN (NITROSTAT) 0.4 MG SL tablet   2. Would you like to learn more about the convenience, safety, & potential cost savings by using the [facility]?   3. Are you open to using the [facility] (Type [facility]. ).  4. Which pharmacy/location (including street and city if local pharmacy) is medication to be sent to?  [facility] - , [place] - [address]   5. Do they need a 30 day or 90 day supply?    Caller stated patient is completely out of this medication.  Caller stated can call patient directly at [phone] for further information.

## 2023-04-16 NOTE — Telephone Encounter (Signed)
 Medication sent using previous instructions:  nitroGLYCERIN (NITROSTAT) 0.4 MG SL tablet Medication Date: 08/07/2019 Department: Lowella Ruder at [place] Ordering/Authorizing: Krasowski, Robert J, MD   Order Providers  Prescribing Provider Encounter Provider  Manfred Seed, MD Bascom Lily, CMA   Outpatient Medication Detail   Disp Refills Start End   nitroGLYCERIN (NITROSTAT) 0.4 MG SL tablet 75 tablet 2 08/07/2019 --   Sig - Route: Place 1 tablet (0.4 mg total) under the tongue every 5 (five) minutes as needed. - Sublingual   Patient taking differently: Place 0.4 mg under the tongue every 5 (five) minutes as needed for chest pain.   Sent to pharmacy as: nitroGLYCERIN (NITROSTAT) 0.4 MG SL tablet   E-Prescribing Status: Receipt confirmed by pharmacy (08/07/2019  4:06 PM EDT)

## 2023-04-22 DIAGNOSIS — N184 Chronic kidney disease, stage 4 (severe): Secondary | ICD-10-CM | POA: Diagnosis not present

## 2023-04-22 DIAGNOSIS — D631 Anemia in chronic kidney disease: Secondary | ICD-10-CM | POA: Diagnosis not present

## 2023-04-22 DIAGNOSIS — I130 Hypertensive heart and chronic kidney disease with heart failure and stage 1 through stage 4 chronic kidney disease, or unspecified chronic kidney disease: Secondary | ICD-10-CM | POA: Diagnosis not present

## 2023-04-22 DIAGNOSIS — I5033 Acute on chronic diastolic (congestive) heart failure: Secondary | ICD-10-CM | POA: Diagnosis not present

## 2023-04-22 DIAGNOSIS — E1122 Type 2 diabetes mellitus with diabetic chronic kidney disease: Secondary | ICD-10-CM | POA: Diagnosis not present

## 2023-04-23 DIAGNOSIS — E1122 Type 2 diabetes mellitus with diabetic chronic kidney disease: Secondary | ICD-10-CM | POA: Diagnosis not present

## 2023-04-23 DIAGNOSIS — I5033 Acute on chronic diastolic (congestive) heart failure: Secondary | ICD-10-CM | POA: Diagnosis not present

## 2023-04-23 DIAGNOSIS — N184 Chronic kidney disease, stage 4 (severe): Secondary | ICD-10-CM | POA: Diagnosis not present

## 2023-04-23 DIAGNOSIS — D631 Anemia in chronic kidney disease: Secondary | ICD-10-CM | POA: Diagnosis not present

## 2023-04-23 DIAGNOSIS — I130 Hypertensive heart and chronic kidney disease with heart failure and stage 1 through stage 4 chronic kidney disease, or unspecified chronic kidney disease: Secondary | ICD-10-CM | POA: Diagnosis not present

## 2023-04-29 DIAGNOSIS — N184 Chronic kidney disease, stage 4 (severe): Secondary | ICD-10-CM | POA: Diagnosis not present

## 2023-04-29 DIAGNOSIS — I5033 Acute on chronic diastolic (congestive) heart failure: Secondary | ICD-10-CM | POA: Diagnosis not present

## 2023-04-29 DIAGNOSIS — D631 Anemia in chronic kidney disease: Secondary | ICD-10-CM | POA: Diagnosis not present

## 2023-04-29 DIAGNOSIS — E1122 Type 2 diabetes mellitus with diabetic chronic kidney disease: Secondary | ICD-10-CM | POA: Diagnosis not present

## 2023-04-29 DIAGNOSIS — I130 Hypertensive heart and chronic kidney disease with heart failure and stage 1 through stage 4 chronic kidney disease, or unspecified chronic kidney disease: Secondary | ICD-10-CM | POA: Diagnosis not present

## 2023-05-01 DIAGNOSIS — F331 Major depressive disorder, recurrent, moderate: Secondary | ICD-10-CM | POA: Diagnosis not present

## 2023-05-03 DIAGNOSIS — E1122 Type 2 diabetes mellitus with diabetic chronic kidney disease: Secondary | ICD-10-CM | POA: Diagnosis not present

## 2023-05-03 DIAGNOSIS — I130 Hypertensive heart and chronic kidney disease with heart failure and stage 1 through stage 4 chronic kidney disease, or unspecified chronic kidney disease: Secondary | ICD-10-CM | POA: Diagnosis not present

## 2023-05-03 DIAGNOSIS — N184 Chronic kidney disease, stage 4 (severe): Secondary | ICD-10-CM | POA: Diagnosis not present

## 2023-05-03 DIAGNOSIS — I5033 Acute on chronic diastolic (congestive) heart failure: Secondary | ICD-10-CM | POA: Diagnosis not present

## 2023-05-03 DIAGNOSIS — D631 Anemia in chronic kidney disease: Secondary | ICD-10-CM | POA: Diagnosis not present

## 2023-05-08 DIAGNOSIS — G4700 Insomnia, unspecified: Secondary | ICD-10-CM | POA: Diagnosis not present

## 2023-05-08 DIAGNOSIS — I441 Atrioventricular block, second degree: Secondary | ICD-10-CM | POA: Diagnosis not present

## 2023-05-08 DIAGNOSIS — D510 Vitamin B12 deficiency anemia due to intrinsic factor deficiency: Secondary | ICD-10-CM | POA: Diagnosis not present

## 2023-05-08 DIAGNOSIS — M8580 Other specified disorders of bone density and structure, unspecified site: Secondary | ICD-10-CM | POA: Diagnosis not present

## 2023-05-08 DIAGNOSIS — G4733 Obstructive sleep apnea (adult) (pediatric): Secondary | ICD-10-CM | POA: Diagnosis not present

## 2023-05-08 DIAGNOSIS — I472 Ventricular tachycardia, unspecified: Secondary | ICD-10-CM | POA: Diagnosis not present

## 2023-05-08 DIAGNOSIS — E039 Hypothyroidism, unspecified: Secondary | ICD-10-CM | POA: Diagnosis not present

## 2023-05-08 DIAGNOSIS — I350 Nonrheumatic aortic (valve) stenosis: Secondary | ICD-10-CM | POA: Diagnosis not present

## 2023-05-08 DIAGNOSIS — K219 Gastro-esophageal reflux disease without esophagitis: Secondary | ICD-10-CM | POA: Diagnosis not present

## 2023-05-08 DIAGNOSIS — M1990 Unspecified osteoarthritis, unspecified site: Secondary | ICD-10-CM | POA: Diagnosis not present

## 2023-05-08 DIAGNOSIS — D631 Anemia in chronic kidney disease: Secondary | ICD-10-CM | POA: Diagnosis not present

## 2023-05-08 DIAGNOSIS — I130 Hypertensive heart and chronic kidney disease with heart failure and stage 1 through stage 4 chronic kidney disease, or unspecified chronic kidney disease: Secondary | ICD-10-CM | POA: Diagnosis not present

## 2023-05-08 DIAGNOSIS — Z6841 Body Mass Index (BMI) 40.0 and over, adult: Secondary | ICD-10-CM | POA: Diagnosis not present

## 2023-05-08 DIAGNOSIS — N184 Chronic kidney disease, stage 4 (severe): Secondary | ICD-10-CM | POA: Diagnosis not present

## 2023-05-08 DIAGNOSIS — E7800 Pure hypercholesterolemia, unspecified: Secondary | ICD-10-CM | POA: Diagnosis not present

## 2023-05-08 DIAGNOSIS — E559 Vitamin D deficiency, unspecified: Secondary | ICD-10-CM | POA: Diagnosis not present

## 2023-05-08 DIAGNOSIS — E1122 Type 2 diabetes mellitus with diabetic chronic kidney disease: Secondary | ICD-10-CM | POA: Diagnosis not present

## 2023-05-08 DIAGNOSIS — F419 Anxiety disorder, unspecified: Secondary | ICD-10-CM | POA: Diagnosis not present

## 2023-05-08 DIAGNOSIS — I2510 Atherosclerotic heart disease of native coronary artery without angina pectoris: Secondary | ICD-10-CM | POA: Diagnosis not present

## 2023-05-08 DIAGNOSIS — D509 Iron deficiency anemia, unspecified: Secondary | ICD-10-CM | POA: Diagnosis not present

## 2023-05-08 DIAGNOSIS — F331 Major depressive disorder, recurrent, moderate: Secondary | ICD-10-CM | POA: Diagnosis not present

## 2023-05-08 DIAGNOSIS — F32A Depression, unspecified: Secondary | ICD-10-CM | POA: Diagnosis not present

## 2023-05-08 DIAGNOSIS — G8929 Other chronic pain: Secondary | ICD-10-CM | POA: Diagnosis not present

## 2023-05-08 DIAGNOSIS — Z794 Long term (current) use of insulin: Secondary | ICD-10-CM | POA: Diagnosis not present

## 2023-05-08 DIAGNOSIS — I5033 Acute on chronic diastolic (congestive) heart failure: Secondary | ICD-10-CM | POA: Diagnosis not present

## 2023-05-09 ENCOUNTER — Ambulatory Visit (INDEPENDENT_AMBULATORY_CARE_PROVIDER_SITE_OTHER): Payer: Medicare Other

## 2023-05-09 ENCOUNTER — Encounter (HOSPITAL_COMMUNITY): Payer: Self-pay

## 2023-05-09 DIAGNOSIS — I5033 Acute on chronic diastolic (congestive) heart failure: Secondary | ICD-10-CM | POA: Diagnosis not present

## 2023-05-09 DIAGNOSIS — I130 Hypertensive heart and chronic kidney disease with heart failure and stage 1 through stage 4 chronic kidney disease, or unspecified chronic kidney disease: Secondary | ICD-10-CM | POA: Diagnosis not present

## 2023-05-09 DIAGNOSIS — I441 Atrioventricular block, second degree: Secondary | ICD-10-CM

## 2023-05-09 DIAGNOSIS — N184 Chronic kidney disease, stage 4 (severe): Secondary | ICD-10-CM | POA: Diagnosis not present

## 2023-05-09 DIAGNOSIS — E1122 Type 2 diabetes mellitus with diabetic chronic kidney disease: Secondary | ICD-10-CM | POA: Diagnosis not present

## 2023-05-09 DIAGNOSIS — D631 Anemia in chronic kidney disease: Secondary | ICD-10-CM | POA: Diagnosis not present

## 2023-05-10 DIAGNOSIS — E1122 Type 2 diabetes mellitus with diabetic chronic kidney disease: Secondary | ICD-10-CM | POA: Diagnosis not present

## 2023-05-10 DIAGNOSIS — I5033 Acute on chronic diastolic (congestive) heart failure: Secondary | ICD-10-CM | POA: Diagnosis not present

## 2023-05-10 DIAGNOSIS — D631 Anemia in chronic kidney disease: Secondary | ICD-10-CM | POA: Diagnosis not present

## 2023-05-10 DIAGNOSIS — I130 Hypertensive heart and chronic kidney disease with heart failure and stage 1 through stage 4 chronic kidney disease, or unspecified chronic kidney disease: Secondary | ICD-10-CM | POA: Diagnosis not present

## 2023-05-10 DIAGNOSIS — N184 Chronic kidney disease, stage 4 (severe): Secondary | ICD-10-CM | POA: Diagnosis not present

## 2023-05-10 LAB — CUP PACEART REMOTE DEVICE CHECK
Battery Remaining Longevity: 53 mo
Battery Remaining Percentage: 54 %
Battery Voltage: 2.99 V
Brady Statistic AP VP Percent: 47 %
Brady Statistic AP VS Percent: 1 %
Brady Statistic AS VP Percent: 53 %
Brady Statistic AS VS Percent: 1 %
Brady Statistic RA Percent Paced: 47 %
Brady Statistic RV Percent Paced: 99 %
Date Time Interrogation Session: 20250508132328
Implantable Lead Connection Status: 753985
Implantable Lead Connection Status: 753985
Implantable Lead Implant Date: 20200814
Implantable Lead Implant Date: 20200814
Implantable Lead Location: 753859
Implantable Lead Location: 753860
Implantable Pulse Generator Implant Date: 20200814
Lead Channel Impedance Value: 410 Ohm
Lead Channel Impedance Value: 430 Ohm
Lead Channel Pacing Threshold Amplitude: 0.5 V
Lead Channel Pacing Threshold Amplitude: 1 V
Lead Channel Pacing Threshold Pulse Width: 0.5 ms
Lead Channel Pacing Threshold Pulse Width: 0.5 ms
Lead Channel Sensing Intrinsic Amplitude: 2.1 mV
Lead Channel Sensing Intrinsic Amplitude: 7.3 mV
Lead Channel Setting Pacing Amplitude: 2.5 V
Lead Channel Setting Pacing Amplitude: 2.5 V
Lead Channel Setting Pacing Pulse Width: 0.5 ms
Lead Channel Setting Sensing Sensitivity: 2 mV
Pulse Gen Model: 2272
Pulse Gen Serial Number: 9154171

## 2023-05-14 DIAGNOSIS — N184 Chronic kidney disease, stage 4 (severe): Secondary | ICD-10-CM | POA: Diagnosis not present

## 2023-05-14 DIAGNOSIS — I5033 Acute on chronic diastolic (congestive) heart failure: Secondary | ICD-10-CM | POA: Diagnosis not present

## 2023-05-14 DIAGNOSIS — D631 Anemia in chronic kidney disease: Secondary | ICD-10-CM | POA: Diagnosis not present

## 2023-05-14 DIAGNOSIS — E1122 Type 2 diabetes mellitus with diabetic chronic kidney disease: Secondary | ICD-10-CM | POA: Diagnosis not present

## 2023-05-14 DIAGNOSIS — I130 Hypertensive heart and chronic kidney disease with heart failure and stage 1 through stage 4 chronic kidney disease, or unspecified chronic kidney disease: Secondary | ICD-10-CM | POA: Diagnosis not present

## 2023-05-15 DIAGNOSIS — F331 Major depressive disorder, recurrent, moderate: Secondary | ICD-10-CM | POA: Diagnosis not present

## 2023-05-16 ENCOUNTER — Ambulatory Visit: Attending: Cardiology

## 2023-05-16 ENCOUNTER — Ambulatory Visit: Admitting: Cardiology

## 2023-05-16 DIAGNOSIS — R0609 Other forms of dyspnea: Secondary | ICD-10-CM | POA: Diagnosis not present

## 2023-05-16 MED ORDER — PERFLUTREN LIPID MICROSPHERE
1.0000 mL | INTRAVENOUS | Status: AC | PRN
  Administered 2023-05-16: 10 mL via INTRAVENOUS

## 2023-05-18 LAB — ECHOCARDIOGRAM COMPLETE
AR max vel: 1.08 cm2
AV Area VTI: 0.77 cm2
AV Area mean vel: 1.09 cm2
AV Mean grad: 12 mmHg
AV Peak grad: 21.7 mmHg
Ao pk vel: 2.33 m/s
Area-P 1/2: 5.38 cm2
S' Lateral: 2.7 cm

## 2023-05-20 ENCOUNTER — Ambulatory Visit: Payer: Self-pay | Admitting: Cardiology

## 2023-05-21 ENCOUNTER — Ambulatory Visit: Payer: Self-pay | Admitting: Cardiology

## 2023-05-21 ENCOUNTER — Telehealth: Payer: Self-pay

## 2023-05-21 DIAGNOSIS — E039 Hypothyroidism, unspecified: Secondary | ICD-10-CM | POA: Diagnosis not present

## 2023-05-21 DIAGNOSIS — M1990 Unspecified osteoarthritis, unspecified site: Secondary | ICD-10-CM | POA: Diagnosis not present

## 2023-05-21 DIAGNOSIS — E785 Hyperlipidemia, unspecified: Secondary | ICD-10-CM | POA: Diagnosis not present

## 2023-05-21 DIAGNOSIS — R110 Nausea: Secondary | ICD-10-CM | POA: Diagnosis not present

## 2023-05-21 DIAGNOSIS — E1122 Type 2 diabetes mellitus with diabetic chronic kidney disease: Secondary | ICD-10-CM | POA: Diagnosis not present

## 2023-05-21 DIAGNOSIS — N1832 Chronic kidney disease, stage 3b: Secondary | ICD-10-CM | POA: Diagnosis not present

## 2023-05-21 DIAGNOSIS — I428 Other cardiomyopathies: Secondary | ICD-10-CM | POA: Diagnosis not present

## 2023-05-21 DIAGNOSIS — I10 Essential (primary) hypertension: Secondary | ICD-10-CM | POA: Diagnosis not present

## 2023-05-21 DIAGNOSIS — I5032 Chronic diastolic (congestive) heart failure: Secondary | ICD-10-CM | POA: Diagnosis not present

## 2023-05-21 DIAGNOSIS — Z950 Presence of cardiac pacemaker: Secondary | ICD-10-CM | POA: Diagnosis not present

## 2023-05-21 DIAGNOSIS — Z79891 Long term (current) use of opiate analgesic: Secondary | ICD-10-CM | POA: Diagnosis not present

## 2023-05-21 DIAGNOSIS — D539 Nutritional anemia, unspecified: Secondary | ICD-10-CM | POA: Diagnosis not present

## 2023-05-21 DIAGNOSIS — Z794 Long term (current) use of insulin: Secondary | ICD-10-CM | POA: Diagnosis not present

## 2023-05-21 NOTE — Telephone Encounter (Signed)
 Echo Results reviewed with pt as per Dr. Tonja Fray note.  Pt verbalized understanding and had no additional questions.Pt due for appt. Will send to front for appt. Routed to PCP

## 2023-05-22 DIAGNOSIS — N184 Chronic kidney disease, stage 4 (severe): Secondary | ICD-10-CM | POA: Diagnosis not present

## 2023-05-22 DIAGNOSIS — E1122 Type 2 diabetes mellitus with diabetic chronic kidney disease: Secondary | ICD-10-CM | POA: Diagnosis not present

## 2023-05-22 DIAGNOSIS — D631 Anemia in chronic kidney disease: Secondary | ICD-10-CM | POA: Diagnosis not present

## 2023-05-22 DIAGNOSIS — F331 Major depressive disorder, recurrent, moderate: Secondary | ICD-10-CM | POA: Diagnosis not present

## 2023-05-22 DIAGNOSIS — I130 Hypertensive heart and chronic kidney disease with heart failure and stage 1 through stage 4 chronic kidney disease, or unspecified chronic kidney disease: Secondary | ICD-10-CM | POA: Diagnosis not present

## 2023-05-22 DIAGNOSIS — I5033 Acute on chronic diastolic (congestive) heart failure: Secondary | ICD-10-CM | POA: Diagnosis not present

## 2023-05-23 DIAGNOSIS — N184 Chronic kidney disease, stage 4 (severe): Secondary | ICD-10-CM | POA: Diagnosis not present

## 2023-05-23 DIAGNOSIS — E1122 Type 2 diabetes mellitus with diabetic chronic kidney disease: Secondary | ICD-10-CM | POA: Diagnosis not present

## 2023-05-23 DIAGNOSIS — D631 Anemia in chronic kidney disease: Secondary | ICD-10-CM | POA: Diagnosis not present

## 2023-05-23 DIAGNOSIS — I130 Hypertensive heart and chronic kidney disease with heart failure and stage 1 through stage 4 chronic kidney disease, or unspecified chronic kidney disease: Secondary | ICD-10-CM | POA: Diagnosis not present

## 2023-05-23 DIAGNOSIS — I5033 Acute on chronic diastolic (congestive) heart failure: Secondary | ICD-10-CM | POA: Diagnosis not present

## 2023-05-29 DIAGNOSIS — F331 Major depressive disorder, recurrent, moderate: Secondary | ICD-10-CM | POA: Diagnosis not present

## 2023-05-30 DIAGNOSIS — I5033 Acute on chronic diastolic (congestive) heart failure: Secondary | ICD-10-CM | POA: Diagnosis not present

## 2023-05-30 DIAGNOSIS — N184 Chronic kidney disease, stage 4 (severe): Secondary | ICD-10-CM | POA: Diagnosis not present

## 2023-05-30 DIAGNOSIS — I130 Hypertensive heart and chronic kidney disease with heart failure and stage 1 through stage 4 chronic kidney disease, or unspecified chronic kidney disease: Secondary | ICD-10-CM | POA: Diagnosis not present

## 2023-05-30 DIAGNOSIS — D631 Anemia in chronic kidney disease: Secondary | ICD-10-CM | POA: Diagnosis not present

## 2023-05-30 DIAGNOSIS — E1122 Type 2 diabetes mellitus with diabetic chronic kidney disease: Secondary | ICD-10-CM | POA: Diagnosis not present

## 2023-06-05 DIAGNOSIS — I5033 Acute on chronic diastolic (congestive) heart failure: Secondary | ICD-10-CM | POA: Diagnosis not present

## 2023-06-05 DIAGNOSIS — D631 Anemia in chronic kidney disease: Secondary | ICD-10-CM | POA: Diagnosis not present

## 2023-06-05 DIAGNOSIS — E1122 Type 2 diabetes mellitus with diabetic chronic kidney disease: Secondary | ICD-10-CM | POA: Diagnosis not present

## 2023-06-05 DIAGNOSIS — I130 Hypertensive heart and chronic kidney disease with heart failure and stage 1 through stage 4 chronic kidney disease, or unspecified chronic kidney disease: Secondary | ICD-10-CM | POA: Diagnosis not present

## 2023-06-05 DIAGNOSIS — N184 Chronic kidney disease, stage 4 (severe): Secondary | ICD-10-CM | POA: Diagnosis not present

## 2023-06-07 DIAGNOSIS — F331 Major depressive disorder, recurrent, moderate: Secondary | ICD-10-CM | POA: Diagnosis not present

## 2023-06-14 NOTE — Addendum Note (Signed)
 Addended by: Lott Rouleau A on: 06/14/2023 11:54 AM   Modules accepted: Orders

## 2023-06-14 NOTE — Progress Notes (Signed)
 Remote pacemaker transmission.

## 2023-06-19 DIAGNOSIS — F331 Major depressive disorder, recurrent, moderate: Secondary | ICD-10-CM | POA: Diagnosis not present

## 2023-06-26 DIAGNOSIS — F331 Major depressive disorder, recurrent, moderate: Secondary | ICD-10-CM | POA: Diagnosis not present

## 2023-07-03 ENCOUNTER — Telehealth: Payer: Self-pay | Admitting: Cardiology

## 2023-07-03 NOTE — Telephone Encounter (Signed)
 LVM for Vickie Ferguson at [facility] to call regarding pts forms.

## 2023-07-03 NOTE — Telephone Encounter (Signed)
 kim[facility] calling to get update on fax sent over [phone] for plan of care (5 pages) Dr Bernie needs to sign first page

## 2023-07-10 DIAGNOSIS — F331 Major depressive disorder, recurrent, moderate: Secondary | ICD-10-CM | POA: Diagnosis not present

## 2023-07-29 ENCOUNTER — Other Ambulatory Visit: Payer: Self-pay | Admitting: Cardiology

## 2023-08-08 ENCOUNTER — Ambulatory Visit: Payer: Medicare Other

## 2023-08-08 ENCOUNTER — Ambulatory Visit: Payer: Self-pay | Admitting: Cardiology

## 2023-08-08 DIAGNOSIS — I441 Atrioventricular block, second degree: Secondary | ICD-10-CM | POA: Diagnosis not present

## 2023-08-08 LAB — CUP PACEART REMOTE DEVICE CHECK
Battery Remaining Longevity: 49 mo
Battery Remaining Percentage: 51 %
Battery Voltage: 2.99 V
Brady Statistic AP VP Percent: 44 %
Brady Statistic AP VS Percent: 1 %
Brady Statistic AS VP Percent: 56 %
Brady Statistic AS VS Percent: 1 %
Brady Statistic RA Percent Paced: 44 %
Brady Statistic RV Percent Paced: 99 %
Date Time Interrogation Session: 20250807020834
Implantable Lead Connection Status: 753985
Implantable Lead Connection Status: 753985
Implantable Lead Implant Date: 20200814
Implantable Lead Implant Date: 20200814
Implantable Lead Location: 753859
Implantable Lead Location: 753860
Implantable Pulse Generator Implant Date: 20200814
Lead Channel Impedance Value: 410 Ohm
Lead Channel Impedance Value: 430 Ohm
Lead Channel Pacing Threshold Amplitude: 0.5 V
Lead Channel Pacing Threshold Amplitude: 1 V
Lead Channel Pacing Threshold Pulse Width: 0.5 ms
Lead Channel Pacing Threshold Pulse Width: 0.5 ms
Lead Channel Sensing Intrinsic Amplitude: 2 mV
Lead Channel Sensing Intrinsic Amplitude: 2.2 mV
Lead Channel Setting Pacing Amplitude: 2.5 V
Lead Channel Setting Pacing Amplitude: 2.5 V
Lead Channel Setting Pacing Pulse Width: 0.5 ms
Lead Channel Setting Sensing Sensitivity: 2 mV
Pulse Gen Model: 2272
Pulse Gen Serial Number: 9154171

## 2023-08-13 ENCOUNTER — Ambulatory Visit: Attending: Cardiology | Admitting: Cardiology

## 2023-08-13 ENCOUNTER — Encounter: Payer: Self-pay | Admitting: Cardiology

## 2023-08-13 VITALS — BP 144/80 | HR 74 | Ht 64.0 in | Wt 369.6 lb

## 2023-08-13 DIAGNOSIS — R079 Chest pain, unspecified: Secondary | ICD-10-CM | POA: Diagnosis not present

## 2023-08-13 DIAGNOSIS — I5032 Chronic diastolic (congestive) heart failure: Secondary | ICD-10-CM | POA: Insufficient documentation

## 2023-08-13 DIAGNOSIS — R002 Palpitations: Secondary | ICD-10-CM | POA: Insufficient documentation

## 2023-08-13 DIAGNOSIS — R0609 Other forms of dyspnea: Secondary | ICD-10-CM | POA: Diagnosis not present

## 2023-08-13 DIAGNOSIS — I350 Nonrheumatic aortic (valve) stenosis: Secondary | ICD-10-CM | POA: Diagnosis not present

## 2023-08-13 DIAGNOSIS — I441 Atrioventricular block, second degree: Secondary | ICD-10-CM | POA: Insufficient documentation

## 2023-08-13 NOTE — Progress Notes (Signed)
 Cardiology Office Note:    Date:  08/13/2023   ID:  Vickie Ferguson [address], DOB 07-28-1955, MRN [id number]  PCP:  Erick Greig LABOR, NP  Cardiologist:  Lamar Fitch, MD    Referring MD: Erick Greig LABOR, NP   No chief complaint on file.   History of Present Illness:    Vickie Ferguson is a 68 y.o. female past medical history significant for congestive heart failure however last echocardiogram showed preserved ejection fraction, morbid obesity, moderate to severe aortic stenosis, dual-chamber pacemaker dyslipidemia ventricular ectopy, cardiac catheterization done in 2020 showing normal coronaries.  She comes today to my office for follow-up.  Apparently on 20th of last month she woke up in the middle of the night with epigastric pain that she was nauseated threw up few times the pain lasted for couple hours then she was okay but since that time she is profoundly weak tired and exhausted.  She had another episode of chest pain leg radiating to the left arm yesterday.  That lasted only for few minutes.  She is discouraged with all this clinical scenario.  She did have echocardiogram done recently which showed moderate to severe aortic stenosis.  Past Medical History:  Diagnosis Date   Anemia    Anginal pain (HCC)    Aortic stenosis 03/21/2020   Arthritis    CKD (chronic kidney disease), stage III (HCC)    Complication of anesthesia    Congestive heart failure (CHF) (HCC) 06/18/2019   Diabetes mellitus without complication (HCC)    Essential hypertension 07/23/2014   Family history of adverse reaction to anesthesia     FAMILY MEMBERS HAD DIFFICULTY WAKING    Hypertension    Hypothyroidism    Iron deficiency anemia 07/23/2014   Long term current use of insulin  (HCC) 01/07/2017   Morbid (severe) obesity due to excess calories (HCC) 07/23/2014   OSA (obstructive sleep apnea) 04/26/2017   Other long term (current) drug therapy 01/07/2017   Pacemaker Saint Jude device 09/01/2018   Palpitations  11/19/2019   PONV (postoperative nausea and vomiting)    Precordial pain 07/23/2014   Presence of permanent cardiac pacemaker 08/15/2018   St Jude Medical Assurity MRI dual-chamber pacemaker for symptomatic bradycardia   Pressure injury of skin 06/17/2018   Second degree AV block    Shortness of breath 04/26/2017   TIA (transient ischemic attack) 04/26/2017   Type 2 diabetes mellitus without complication (HCC) 07/23/2014   Unstable angina (HCC) 06/17/2018   Ventricular tachycardia (paroxysmal) (HCC) 07/23/2014    Past Surgical History:  Procedure Laterality Date   CARDIAC CATHETERIZATION     INSERT / REPLACE / REMOVE PACEMAKER  08/15/2018   St Jude Medical Assurity MRI dual-chamber pacemaker for symptomatic bradycardia   LEFT HEART CATH AND CORONARY ANGIOGRAPHY N/A 06/17/2018   Procedure: LEFT HEART CATH AND CORONARY ANGIOGRAPHY;  Surgeon: Swazil[place], Peter M, MD;  Location: [facility] INVASIVE CV LAB;  Service: Cardiovascular;  Laterality: N/A;   PACEMAKER IMPLANT N/A 08/15/2018   Procedure: PACEMAKER IMPLANT;  Surgeon: Inocencio Soyla Lunger, MD;  Location: [facility] INVASIVE CV LAB;  Service: Cardiovascular;  Laterality: N/A;   SHOULDER SURGERY Right 2012   METAL PLATE    TOTAL KNEE ARTHROPLASTY Right 2001    Current Medications: Current Meds  Medication Sig   acetaminophen  (TYLENOL ) 500 MG tablet Take 500 mg by mouth every 6 (six) hours as needed for mild pain or headache.   atorvastatin  (LIPITOR) 40 MG tablet Take 1 tablet (40 mg  total) by mouth daily.   clopidogrel  (PLAVIX ) 75 MG tablet Take 1 tablet (75 mg total) by mouth daily.   dexlansoprazole (DEXILANT) 60 MG capsule Take 60 mg by mouth daily.    fluticasone  (FLONASE ) 50 MCG/ACT nasal spray Place 1 spray into both nostrils daily as needed for allergies. For allergies   Insulin  Degludec (TRESIBA [place]) Inject 40 Units into the skin daily. (Patient taking differently: Inject 44 Units into the skin daily.)   levothyroxine  (SYNTHROID , LEVOTHROID) 75 MCG  tablet Take 75 mcg by mouth daily before breakfast.    metoprolol  tartrate (LOPRESSOR ) 50 MG tablet TAKE ONE TABLET BY MOUTH TWICE DAILY   nitroGLYCERIN  (NITROSTAT ) 0.4 MG SL tablet Place 1 tablet (0.4 mg total) under the tongue every 5 (five) minutes as needed. (Patient taking differently: Place 0.4 mg under the tongue every 5 (five) minutes as needed for chest pain.)   Oxycodone  HCl 10 MG TABS Take 1 tablet by mouth as needed (pain).   polyvinyl alcohol  (LIQUIFILM TEARS) 1.4 % ophthalmic solution Place 1 drop into both eyes as needed for dry eyes.   torsemide  (DEMADEX ) 20 MG tablet Take 1 tablet (20 mg total) by mouth 3 (three) times daily. Take 1 tablet twice daily. Pt may take extra 20mg  if her weight increases 3lbs overnight (Patient taking differently: Take 20 mg by mouth 2 (two) times daily. Take 1 tablet twice daily. Pt may take extra 20mg  if her weight increases 3lbs overnight)     Allergies:   Aleve [naproxen sodium], Procainamide, Rythmol [propafenone hcl], Codeine, Doxycycline, Isosorbide nitrate, and Sertraline   Social History   Socioeconomic History   Marital status: Legally Separated    Spouse name: Not on file   Number of children: Not on file   Years of education: Not on file   Highest education level: Not on file  Occupational History   Not on file  Tobacco Use   Smoking status: Never   Smokeless tobacco: Never  Vaping Use   Vaping status: Never Used  Substance and Sexual Activity   Alcohol  use: No    Alcohol /week: 0.0 standard drinks of alcohol    Drug use: No   Sexual activity: Not on file  Other Topics Concern   Not on file  Social History Narrative   Not on file   Social Drivers of Health   Financial Resource Strain: Not on file  Food Insecurity: Not on file  Transportation Needs: Not on file  Physical Activity: Not on file  Stress: Not on file  Social Connections: Not on file     Family History: [name] family history includes Heart disease  in her mother. ROS:   Please see the history of present illness.    All 14 point review of systems negative except as described per history of present illness  EKGs/Labs/Other Studies Reviewed:    EKG Interpretation Date/Time:  Tuesday August 13 2023 11:10:23 EDT Ventricular Rate:  76 PR Interval:  240 QRS Duration:  154 QT Interval:  438 QTC Calculation: 492 R Axis:   29  Text Interpretation: Atrial-sensed ventricular-paced rhythm with prolonged AV conduction When compared with ECG of 07-Sep-2022 11:35, Vent. rate has increased BY  10 BPM Confirmed by Bernie Charleston [phone]) on 08/13/2023 11:26:16 AM    Recent Labs: 04/04/2023: BUN 27; Creatinine, Ser 1.81; NT-Pro BNP 826; Potassium 5.2; Sodium 135  Recent Lipid Panel No results found for: CHOL, TRIG, HDL, CHOLHDL, VLDL, LDLCALC, LDLDIRECT  Physical Exam:    VS:  BP (!) 144/80 (BP Location: Left Arm, Patient Position: Sitting, Cuff Size: Large)   Pulse 74   Ht 5' 4 (1.626 m)   Wt (!) 369 lb 9.6 oz (167.6 kg)   SpO2 99%   BMI 63.44 kg/m     Wt Readings from Last 3 Encounters:  08/13/23 (!) 369 lb 9.6 oz (167.6 kg)  04/04/23 (!) 359 lb (162.8 kg)  10/29/22 (!) 350 lb 12.8 oz (159.1 kg)     GEN:  Well nourished, well developed in no acute distress HEENT: Normal NECK: No JVD; No carotid bruits LYMPHATICS: No lymphadenopathy CARDIAC: RRR, systolic ejection murmur grade 2/6 pressure right upper portion of the sternum, S2 is still present no rubs, no gallops RESPIRATORY:  Clear to auscultation without rales, wheezing or rhonchi  ABDOMEN: Soft, non-tender, non-distended MUSCULOSKELETAL:  No edema; No deformity  SKIN: Warm and dry LOWER EXTREMITIES: no swelling NEUROLOGIC:  Alert and oriented x 3 PSYCHIATRIC:  Normal affect   ASSESSMENT:    1. Second degree AV block   2. Palpitations   3. Nonrheumatic aortic valve stenosis   4. Chronic diastolic congestive heart failure (HCC)    PLAN:    In order of  problems listed above:  Aortic stenosis appears to be moderate to severe.  Parameters getting towards a significant aortic stenosis.  Auscultatory finding however showing still presence of S2.  The problem is that she will be a poor candidate for open heart surgery.  I also see her being poor candidate for potential TAVI secondary to her morbid obesity which obviously limits our options tremendously. Chest pain that she has had from 2012 last month as well as yesterday.  Could be related to aortic stenosis could be related to coronary disease, likely cardiac catheterization 2020 did not show any significant coronary artery disease.  Will do troponin I will repeat echocardiogram to look for segmental wall motion abnormalities, EKG is uninterpretable because of paced rhythm. Chronic diastolic congestive heart failure will do proBNP and Chem-7. Is a very difficult situation.  Aortic valve getting worse reaching the point that may be interventions needed but at the same time she is morbidly obese poor candidate for any intervention   Medication Adjustments/Labs and Tests Ordered: Current medicines are reviewed at length with the patient today.  Concerns regarding medicines are outlined above.  Orders Placed This Encounter  Procedures   EKG 12-Lead   Medication changes: No orders of the defined types were placed in this encounter.   Signed, Lamar DOROTHA Fitch, MD, [facility] 08/13/2023 11:30 AM    [facility]

## 2023-08-13 NOTE — Patient Instructions (Signed)
 Medication Instructions:  Your physician recommends that you continue on your current medications as directed. Please refer to the Current Medication list given to you today.  *If you need a refill on your cardiac medications before your next appointment, please call your pharmacy*   Lab Work: Troponin I, ProBNP, BMP- today If you have labs (blood work) drawn today and your tests are completely normal, you will receive your results only by: MyChart Message (if you have MyChart) OR A paper copy in the mail If you have any lab test that is abnormal or we need to change your treatment, we will call you to review the results.   Testing/Procedures: Your physician has requested that you have an echocardiogram. Echocardiography is a painless test that uses sound waves to create images of your heart. It provides your doctor with information about the size and shape of your heart and how well your heart's chambers and valves are working. This procedure takes approximately one hour. There are no restrictions for this procedure. Please do NOT wear cologne, perfume, aftershave, or lotions (deodorant is allowed). Please arrive 15 minutes prior to your appointment time.  Please note: We ask at that you not bring children with you during ultrasound (echo/ vascular) testing. Due to room size and safety concerns, children are not allowed in the ultrasound rooms during exams. Our front office staff cannot provide observation of children in our lobby area while testing is being conducted. An adult accompanying a patient to their appointment will only be allowed in the ultrasound room at the discretion of the ultrasound technician under special circumstances. We apologize for any inconvenience.    Follow-Up: At [facility], you and your health needs are our priority.  As part of our continuing mission to provide you with exceptional heart care, we have created designated Provider Care Teams.  These Care Teams  include your primary Cardiologist (physician) and Advanced Practice Providers (APPs -  Physician Assistants and Nurse Practitioners) who all work together to provide you with the care you need, when you need it.  We recommend signing up for the patient portal called MyChart.  Sign up information is provided on this After Visit Summary.  MyChart is used to connect with patients for Virtual Visits (Telemedicine).  Patients are able to view lab/test results, encounter notes, upcoming appointments, etc.  Non-urgent messages can be sent to your provider as well.   To learn more about what you can do with MyChart, go to [email or web address].    Your next appointment:   3 month(s)  The format for your next appointment:   In Person  Provider:   Lamar Fitch, MD    Other Instructions NA

## 2023-08-14 DIAGNOSIS — F331 Major depressive disorder, recurrent, moderate: Secondary | ICD-10-CM | POA: Diagnosis not present

## 2023-08-14 LAB — BASIC METABOLIC PANEL WITH GFR
BUN/Creatinine Ratio: 16 (ref 12–28)
BUN: 25 mg/dL (ref 8–27)
CO2: 21 mmol/L (ref 20–29)
Calcium: 9.2 mg/dL (ref 8.7–10.3)
Chloride: 97 mmol/L (ref 96–106)
Creatinine, Ser: 1.59 mg/dL — ABNORMAL HIGH (ref 0.57–1.00)
Glucose: 181 mg/dL — ABNORMAL HIGH (ref 70–99)
Potassium: 4.8 mmol/L (ref 3.5–5.2)
Sodium: 136 mmol/L (ref 134–144)
eGFR: 35 mL/min/1.73 — ABNORMAL LOW (ref >59)

## 2023-08-14 LAB — PRO B NATRIURETIC PEPTIDE: NT-Pro BNP: 981 pg/mL — ABNORMAL HIGH (ref 0–301)

## 2023-08-14 LAB — TROPONIN T: Troponin T (Highly Sensitive): 16 ng/L — ABNORMAL HIGH (ref 0–14)

## 2023-08-16 DIAGNOSIS — F331 Major depressive disorder, recurrent, moderate: Secondary | ICD-10-CM | POA: Diagnosis not present

## 2023-08-22 DIAGNOSIS — E039 Hypothyroidism, unspecified: Secondary | ICD-10-CM | POA: Diagnosis not present

## 2023-08-22 DIAGNOSIS — E785 Hyperlipidemia, unspecified: Secondary | ICD-10-CM | POA: Diagnosis not present

## 2023-08-22 DIAGNOSIS — M1990 Unspecified osteoarthritis, unspecified site: Secondary | ICD-10-CM | POA: Diagnosis not present

## 2023-08-22 DIAGNOSIS — I428 Other cardiomyopathies: Secondary | ICD-10-CM | POA: Diagnosis not present

## 2023-08-22 DIAGNOSIS — Z950 Presence of cardiac pacemaker: Secondary | ICD-10-CM | POA: Diagnosis not present

## 2023-08-22 DIAGNOSIS — D539 Nutritional anemia, unspecified: Secondary | ICD-10-CM | POA: Diagnosis not present

## 2023-08-22 DIAGNOSIS — I10 Essential (primary) hypertension: Secondary | ICD-10-CM | POA: Diagnosis not present

## 2023-08-22 DIAGNOSIS — E1122 Type 2 diabetes mellitus with diabetic chronic kidney disease: Secondary | ICD-10-CM | POA: Diagnosis not present

## 2023-08-22 DIAGNOSIS — I5032 Chronic diastolic (congestive) heart failure: Secondary | ICD-10-CM | POA: Diagnosis not present

## 2023-08-22 DIAGNOSIS — Z6841 Body Mass Index (BMI) 40.0 and over, adult: Secondary | ICD-10-CM | POA: Diagnosis not present

## 2023-08-22 DIAGNOSIS — Z794 Long term (current) use of insulin: Secondary | ICD-10-CM | POA: Diagnosis not present

## 2023-08-22 DIAGNOSIS — N1832 Chronic kidney disease, stage 3b: Secondary | ICD-10-CM | POA: Diagnosis not present

## 2023-08-22 DIAGNOSIS — R110 Nausea: Secondary | ICD-10-CM | POA: Diagnosis not present

## 2023-08-30 DIAGNOSIS — F411 Generalized anxiety disorder: Secondary | ICD-10-CM | POA: Diagnosis not present

## 2023-09-01 DIAGNOSIS — F411 Generalized anxiety disorder: Secondary | ICD-10-CM | POA: Diagnosis not present

## 2023-09-04 ENCOUNTER — Ambulatory Visit: Attending: Cardiology

## 2023-09-04 DIAGNOSIS — R0609 Other forms of dyspnea: Secondary | ICD-10-CM | POA: Diagnosis not present

## 2023-09-04 MED ORDER — PERFLUTREN LIPID MICROSPHERE
1.0000 mL | INTRAVENOUS | Status: AC | PRN
  Administered 2023-09-04: 5 mL via INTRAVENOUS

## 2023-09-06 ENCOUNTER — Ambulatory Visit: Payer: Self-pay | Admitting: Cardiology

## 2023-09-06 LAB — ECHOCARDIOGRAM COMPLETE
AR max vel: 1.32 cm2
AV Area VTI: 1.22 cm2
AV Area mean vel: 1.32 cm2
AV Mean grad: 12.8 mmHg
AV Peak grad: 24.4 mmHg
Ao pk vel: 2.47 m/s
Area-P 1/2: 3.56 cm2
MV VTI: 2.16 cm2
S' Lateral: 2.6 cm

## 2023-09-11 ENCOUNTER — Telehealth: Payer: Self-pay

## 2023-09-11 NOTE — Telephone Encounter (Signed)
 Echo Results reviewed with pt as per Dr. Vanetta Shawl note.  Pt verbalized understanding and had no additional questions. Routed to PCP

## 2023-09-28 DIAGNOSIS — F411 Generalized anxiety disorder: Secondary | ICD-10-CM | POA: Diagnosis not present

## 2023-09-28 NOTE — Progress Notes (Signed)
 Remote PPM Transmission

## 2023-09-30 DIAGNOSIS — F411 Generalized anxiety disorder: Secondary | ICD-10-CM | POA: Diagnosis not present

## 2023-10-04 DIAGNOSIS — M79674 Pain in right toe(s): Secondary | ICD-10-CM | POA: Diagnosis not present

## 2023-10-14 DIAGNOSIS — F411 Generalized anxiety disorder: Secondary | ICD-10-CM | POA: Diagnosis not present

## 2023-10-16 DIAGNOSIS — F331 Major depressive disorder, recurrent, moderate: Secondary | ICD-10-CM | POA: Diagnosis not present

## 2023-10-30 DIAGNOSIS — F411 Generalized anxiety disorder: Secondary | ICD-10-CM | POA: Diagnosis not present

## 2023-10-31 DIAGNOSIS — F331 Major depressive disorder, recurrent, moderate: Secondary | ICD-10-CM | POA: Diagnosis not present

## 2023-11-06 ENCOUNTER — Telehealth: Payer: Self-pay | Admitting: Cardiology

## 2023-11-06 NOTE — Telephone Encounter (Signed)
 Spoke with patient to offer a sooner appt and she refused.

## 2023-11-06 NOTE — Telephone Encounter (Signed)
 Pt calling to reschedule 11/12 appt, but next available is 01/10/24. Pt would like a c/b about being seen sooner. Please advise.

## 2023-11-07 ENCOUNTER — Ambulatory Visit: Payer: Medicare Other

## 2023-11-07 DIAGNOSIS — R002 Palpitations: Secondary | ICD-10-CM

## 2023-11-08 LAB — CUP PACEART REMOTE DEVICE CHECK
Battery Remaining Longevity: 47 mo
Battery Remaining Percentage: 48 %
Battery Voltage: 2.99 V
Brady Statistic AP VP Percent: 43 %
Brady Statistic AP VS Percent: 1 %
Brady Statistic AS VP Percent: 57 %
Brady Statistic AS VS Percent: 1 %
Brady Statistic RA Percent Paced: 43 %
Brady Statistic RV Percent Paced: 99 %
Date Time Interrogation Session: 20251107095520
Implantable Lead Connection Status: 753985
Implantable Lead Connection Status: 753985
Implantable Lead Implant Date: 20200814
Implantable Lead Implant Date: 20200814
Implantable Lead Location: 753859
Implantable Lead Location: 753860
Implantable Pulse Generator Implant Date: 20200814
Lead Channel Impedance Value: 400 Ohm
Lead Channel Impedance Value: 430 Ohm
Lead Channel Pacing Threshold Amplitude: 0.5 V
Lead Channel Pacing Threshold Amplitude: 1 V
Lead Channel Pacing Threshold Pulse Width: 0.5 ms
Lead Channel Pacing Threshold Pulse Width: 0.5 ms
Lead Channel Sensing Intrinsic Amplitude: 1.7 mV
Lead Channel Sensing Intrinsic Amplitude: 3.4 mV
Lead Channel Setting Pacing Amplitude: 2.5 V
Lead Channel Setting Pacing Amplitude: 2.5 V
Lead Channel Setting Pacing Pulse Width: 0.5 ms
Lead Channel Setting Sensing Sensitivity: 2 mV
Pulse Gen Model: 2272
Pulse Gen Serial Number: 9154171

## 2023-11-11 NOTE — Progress Notes (Signed)
 Remote PPM Transmission

## 2023-11-12 ENCOUNTER — Ambulatory Visit: Payer: Self-pay | Admitting: Cardiology

## 2023-11-13 ENCOUNTER — Ambulatory Visit: Admitting: Cardiology

## 2023-11-25 DIAGNOSIS — D539 Nutritional anemia, unspecified: Secondary | ICD-10-CM | POA: Diagnosis not present

## 2023-11-25 DIAGNOSIS — E785 Hyperlipidemia, unspecified: Secondary | ICD-10-CM | POA: Diagnosis not present

## 2023-11-25 DIAGNOSIS — E1122 Type 2 diabetes mellitus with diabetic chronic kidney disease: Secondary | ICD-10-CM | POA: Diagnosis not present

## 2023-11-25 DIAGNOSIS — Z23 Encounter for immunization: Secondary | ICD-10-CM | POA: Diagnosis not present

## 2023-12-09 DIAGNOSIS — Z0000 Encounter for general adult medical examination without abnormal findings: Secondary | ICD-10-CM | POA: Diagnosis not present

## 2023-12-09 DIAGNOSIS — Z9181 History of falling: Secondary | ICD-10-CM | POA: Diagnosis not present

## 2024-01-15 ENCOUNTER — Encounter: Payer: Self-pay | Admitting: Cardiology

## 2024-01-15 ENCOUNTER — Ambulatory Visit: Attending: Cardiology | Admitting: Cardiology

## 2024-01-15 VITALS — BP 169/90 | HR 92 | Ht 64.0 in | Wt 365.0 lb

## 2024-01-15 DIAGNOSIS — I10 Essential (primary) hypertension: Secondary | ICD-10-CM | POA: Insufficient documentation

## 2024-01-15 DIAGNOSIS — I639 Cerebral infarction, unspecified: Secondary | ICD-10-CM | POA: Insufficient documentation

## 2024-01-15 DIAGNOSIS — Z950 Presence of cardiac pacemaker: Secondary | ICD-10-CM | POA: Insufficient documentation

## 2024-01-15 DIAGNOSIS — I350 Nonrheumatic aortic (valve) stenosis: Secondary | ICD-10-CM | POA: Insufficient documentation

## 2024-01-15 NOTE — Addendum Note (Signed)
 Addended by: ARLOA MALLORY D on: 01/15/2024 02:41 PM   Modules accepted: Orders

## 2024-01-15 NOTE — Patient Instructions (Signed)
 Medication Instructions:  Your physician recommends that you continue on your current medications as directed. Please refer to the Current Medication list given to you today.  *If you need a refill on your cardiac medications before your next appointment, please call your pharmacy*   Lab Work: None Ordered If you have labs (blood work) drawn today and your tests are completely normal, you will receive your results only by: MyChart Message (if you have MyChart) OR A paper copy in the mail If you have any lab test that is abnormal or we need to change your treatment, we will call you to review the results.   Testing/Procedures: Non-Cardiac CT scanning, (CAT scanning), is a noninvasive, special x-ray that produces cross-sectional images of the body using x-rays and a computer. CT scans help physicians diagnose and treat medical conditions. For some CT exams, a contrast material is used to enhance visibility in the area of the body being studied. CT scans provide greater clarity and reveal more details than regular x-ray exams.    Follow-Up: At [facility], you and your health needs are our priority.  As part of our continuing mission to provide you with exceptional heart care, we have created designated Provider Care Teams.  These Care Teams include your primary Cardiologist (physician) and Advanced Practice Providers (APPs -  Physician Assistants and Nurse Practitioners) who all work together to provide you with the care you need, when you need it.  We recommend signing up for the patient portal called MyChart.  Sign up information is provided on this After Visit Summary.  MyChart is used to connect with patients for Virtual Visits (Telemedicine).  Patients are able to view lab/test results, encounter notes, upcoming appointments, etc.  Non-urgent messages can be sent to your provider as well.   To learn more about what you can do with MyChart, go to [email or web address].    Your next  appointment:   1 month(s)  The format for your next appointment:   In Person  Provider:   Lamar Fitch, MD    Other Instructions NA

## 2024-01-15 NOTE — Progress Notes (Signed)
 " Cardiology Office Note:    Date:  01/15/2024   ID:  Vickie Ferguson, DOB 1955/07/16, MRN [id number]  PCP:  Vickie Greig LABOR, NP  Cardiologist:  Lamar Fitch, MD    Referring MD: Vickie Greig LABOR, NP   Chief Complaint  Patient presents with   Follow-up  Not doing well  History of Present Illness:    Vickie Ferguson is a 69 y.o. female past medical history significant for diastolic congestive heart failure, moderate to severe aortic stenosis, dual-chamber pacemaker which is sent to Jude device, dyslipidemia, ventricular ectopy, cardiac catheterization done in 2020 showing normal coronaries.  She comes today to months for follow-up.  She is crying in the office.  She said in October she had episodes of nausea she started having some vision problem) she eventually developed weakness and dragging left lower extremities with some mild drooping.  Blood pressure was normal at that time and sugar was also fine.  Then she had another episode on 12/21/2023.  Lasted for about 15 to 20 minutes.  Since that time she seems to be doing fine.  But overall mobility became a problem.  She described to have some palpitations as well but no passing out.  Past Medical History:  Diagnosis Date   Anemia    Anemia in chronic kidney disease 06/29/2020   Aortic stenosis 03/21/2020   Arthritis    CKD (chronic kidney disease), stage III (HCC)    Complication of anesthesia    Congestive heart failure (CHF) (HCC) 06/18/2019   Diabetes mellitus without complication (HCC)    Essential hypertension 07/23/2014   Family history of adverse reaction to anesthesia     FAMILY MEMBERS HAD DIFFICULTY WAKING    Hypertension    Hypothyroidism    Iron deficiency anemia 07/23/2014   Long term current use of insulin  (HCC) 01/07/2017   Morbid (severe) obesity due to excess calories (HCC) 07/23/2014   OSA (obstructive sleep apnea) 04/26/2017   Other long term (current) drug therapy 01/07/2017   Pacemaker [employer] Jude  device 09/01/2018   Pacemaker-mediated tachycardia 05/31/2020   Palpitations 11/19/2019   PONV (postoperative nausea and vomiting)    Precordial pain 07/23/2014   Presence of permanent cardiac pacemaker 08/15/2018   [employer] Jude [employer]urity MRI dual-chamber pacemaker for symptomatic bradycardia   Pressure injury of skin 06/17/2018   Second degree AV block    Shortness of breath 04/26/2017   TIA (transient ischemic attack) 04/26/2017   Type 2 diabetes mellitus without complication 07/23/2014   Unstable angina (HCC) 06/17/2018   Ventricular tachycardia (paroxysmal) (HCC) 07/23/2014    Past Surgical History:  Procedure Laterality Date   CARDIAC CATHETERIZATION     INSERT / REPLACE / REMOVE PACEMAKER  08/15/2018   St Jude Medical Assurity MRI dual-chamber pacemaker for symptomatic bradycardia   LEFT HEART CATH AND CORONARY ANGIOGRAPHY N/A 06/17/2018   Procedure: LEFT HEART CATH AND CORONARY ANGIOGRAPHY;  Surgeon: Jordan, Peter M, MD;  Location: [facility] INVASIVE CV LAB;  Service: Cardiovascular;  Laterality: N/A;   PACEMAKER IMPLANT N/A 08/15/2018   Procedure: PACEMAKER IMPLANT;  Surgeon: Inocencio Soyla Lunger, MD;  Location: [facility] INVASIVE CV LAB;  Service: Cardiovascular;  Laterality: N/A;   SHOULDER SURGERY Right 2012   METAL PLATE    TOTAL KNEE ARTHROPLASTY Right 2001    Current Medications: Active Medications[1]   Allergies:   Aleve [naproxen sodium], Procainamide, Rythmol [propafenone hcl], Codeine, Doxycycline, Isosorbide nitrate, and Sertraline   Social History   Socioeconomic  History   Marital status: Legally Separated    Spouse name: Not on file   Number of children: Not on file   Years of education: Not on file   Highest education level: Not on file  Occupational History   Not on file  Tobacco Use   Smoking status: Never   Smokeless tobacco: Never  Vaping Use   Vaping status: Never Used  Substance and Sexual Activity   Alcohol  use: No    Alcohol /week: 0.0 standard  drinks of alcohol    Drug use: No   Sexual activity: Not on file  Other Topics Concern   Not on file  Social History Narrative   Not on file   Social Drivers of Health   Tobacco Use: Low Risk (01/15/2024)   Patient History    Smoking Tobacco Use: Never    Smokeless Tobacco Use: Never    Passive Exposure: Not on file  Financial Resource Strain: Not on file  Food Insecurity: Not on file  Transportation Needs: Not on file  Physical Activity: Not on file  Stress: Not on file  Social Connections: Not on file  Depression (EYV7-0): Not on file  Alcohol  Screen: Not on file  Housing: Not on file  Utilities: Not on file  Health Literacy: Not on file     Family History: [name] family history includes Heart disease in her mother. ROS:   Please see the history of present illness.    All 14 point review of systems negative except as described per history of present illness  EKGs/Labs/Other Studies Reviewed:         Recent Labs: 08/13/2023: BUN 25; Creatinine, Ser 1.59; NT-Pro BNP 981; Potassium 4.8; Sodium 136  Recent Lipid Panel No results found for: CHOL, TRIG, HDL, CHOLHDL, VLDL, LDLCALC, LDLDIRECT  Physical Exam:    VS:  BP (!) 169/90   Pulse 92   Ht 5' 4 (1.626 m)   Wt (!) 365 lb (165.6 kg)   SpO2 98%   BMI 62.65 kg/m     Wt Readings from Last 3 Encounters:  01/15/24 (!) 365 lb (165.6 kg)  08/13/23 (!) 369 lb 9.6 oz (167.6 kg)  04/04/23 (!) 359 lb (162.8 kg)     GEN:  Well nourished, well developed in no acute distress morbidly obese HEENT: Normal NECK: No JVD; No carotid bruits LYMPHATICS: No lymphadenopathy CARDIAC: RRR, tones are distant, systolic murmur grade 2/6 to 3/6 best heard right upper portion sternal, no rubs, no gallops RESPIRATORY:  Clear to auscultation without rales, wheezing or rhonchi  ABDOMEN: Soft, non-tender, non-distended MUSCULOSKELETAL:  No edema; No deformity  SKIN: Warm and dry LOWER EXTREMITIES: no  swelling NEUROLOGIC:  Alert and oriented x 3 PSYCHIATRIC:  Normal affect   ASSESSMENT:    1. Nonrheumatic aortic valve stenosis   2. Essential hypertension   3. Pacemaker [place] Jude device   4. Morbid (severe) obesity due to excess calories (HCC)    PLAN:    In order of problems listed above:  Nonrheumatic aortic valve stenosis.  Last echocardiogram showed moderate to severe, the dilemma is what to do if it became severe.  Definitely she is not a candidate for open heart surgery because of her BMI being 62 I do not think she would be candidate for TAVI, I brought this topic to her and ask her to consider taking Wegovy to lose some weight but she immediately said that she is not interested she heard that this medication killing people she  simply does not want to try that.  Will continue this conversation. Questionable TIA/CVA.  She still complain of having residual weakness of the left side of her body will try to make arrangements for her to have a CT, she complained of having transportation issue will see if will be able to help with that. 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  Concerns regarding medicines are outlined above.  No orders of the defined types were placed in this encounter.  Medication changes: No orders of the defined types were placed in this encounter.   Signed, Lamar DOROTHA Fitch, MD, [facility] 01/15/2024 2:22 PM    [facility]    [1]  Current Meds  Medication Sig   acetaminophen  (TYLENOL ) 500 MG tablet Take 500 mg by mouth every 6 (six) hours as needed for mild pain or headache.   atorvastatin  (LIPITOR) 40 MG tablet Take 1 tablet (40 mg total) by mouth daily.   clopidogrel  (PLAVIX ) 75 MG tablet Take 1 tablet (75 mg total) by mouth daily.   dexlansoprazole (DEXILANT) 60 MG capsule Take  60 mg by mouth daily.    fluticasone  (FLONASE ) 50 MCG/ACT nasal spray Place 1 spray into both nostrils daily as needed for allergies. For allergies   levothyroxine  (SYNTHROID , LEVOTHROID) 75 MCG tablet Take 75 mcg by mouth daily before breakfast.    metoprolol  tartrate (LOPRESSOR ) 50 MG tablet TAKE ONE TABLET BY MOUTH TWICE DAILY   nitroGLYCERIN  (NITROSTAT ) 0.4 MG SL tablet Place 1 tablet (0.4 mg total) under the tongue every 5 (five) minutes as needed.   ondansetron  (ZOFRAN ) 4 MG tablet Take 4 mg by mouth every 4 (four) hours as needed.   Oxycodone  HCl 10 MG TABS Take 1 tablet by mouth as needed (pain).   polyvinyl alcohol  (LIQUIFILM TEARS) 1.4 % ophthalmic solution Place 1 drop into both eyes as needed for dry eyes.   torsemide  (DEMADEX ) 20 MG tablet Take 1 tablet (20 mg total) by mouth 3 (three) times daily. Take 1 tablet twice daily. Pt may take extra 20mg  if her weight increases 3lbs overnight   TRESIBA FLEXTOUCH 100 UNIT/ML FlexTouch Pen Inject 44 Units into the skin daily.   [DISCONTINUED] Insulin  Degludec (TRESIBA [place]) Inject 40 Units into the skin daily.   "

## 2024-01-29 ENCOUNTER — Inpatient Hospital Stay (HOSPITAL_BASED_OUTPATIENT_CLINIC_OR_DEPARTMENT_OTHER): Admission: RE | Admit: 2024-01-29 | Admitting: Radiology

## 2024-02-05 ENCOUNTER — Other Ambulatory Visit (HOSPITAL_BASED_OUTPATIENT_CLINIC_OR_DEPARTMENT_OTHER): Admitting: Radiology

## 2024-02-06 ENCOUNTER — Ambulatory Visit

## 2024-02-07 ENCOUNTER — Ambulatory Visit: Admitting: Cardiology

## 2024-02-07 ENCOUNTER — Ambulatory Visit: Payer: Self-pay | Admitting: Cardiology

## 2024-02-07 LAB — CUP PACEART REMOTE DEVICE CHECK
Battery Remaining Longevity: 44 mo
Battery Remaining Percentage: 46 %
Battery Voltage: 2.98 V
Brady Statistic AP VP Percent: 41 %
Brady Statistic AP VS Percent: 1 %
Brady Statistic AS VP Percent: 59 %
Brady Statistic AS VS Percent: 1 %
Brady Statistic RA Percent Paced: 41 %
Brady Statistic RV Percent Paced: 99 %
Date Time Interrogation Session: 20260205023017
Implantable Lead Connection Status: 753985
Implantable Lead Connection Status: 753985
Implantable Lead Implant Date: 20200814
Implantable Lead Implant Date: 20200814
Implantable Lead Location: 753859
Implantable Lead Location: 753860
Implantable Pulse Generator Implant Date: 20200814
Lead Channel Impedance Value: 410 Ohm
Lead Channel Impedance Value: 430 Ohm
Lead Channel Pacing Threshold Amplitude: 0.5 V
Lead Channel Pacing Threshold Amplitude: 1 V
Lead Channel Pacing Threshold Pulse Width: 0.5 ms
Lead Channel Pacing Threshold Pulse Width: 0.5 ms
Lead Channel Sensing Intrinsic Amplitude: 1.8 mV
Lead Channel Sensing Intrinsic Amplitude: 3.4 mV
Lead Channel Setting Pacing Amplitude: 2.5 V
Lead Channel Setting Pacing Amplitude: 2.5 V
Lead Channel Setting Pacing Pulse Width: 0.5 ms
Lead Channel Setting Sensing Sensitivity: 2 mV
Pulse Gen Model: 2272
Pulse Gen Serial Number: 9154171

## 2024-02-12 ENCOUNTER — Other Ambulatory Visit (HOSPITAL_BASED_OUTPATIENT_CLINIC_OR_DEPARTMENT_OTHER): Admitting: Radiology

## 2024-05-07 ENCOUNTER — Ambulatory Visit

## 2024-08-06 ENCOUNTER — Ambulatory Visit

## 2024-11-05 ENCOUNTER — Ambulatory Visit
